# Patient Record
Sex: Male | Born: 1980 | Race: White | Hispanic: No | Marital: Single | Smoking: Current every day smoker
Health system: Southern US, Community
[De-identification: ages and names within clinical notes are randomized; demographics above are authoritative.]

## PROBLEM LIST (undated history)

## (undated) DIAGNOSIS — R1115 Cyclical vomiting syndrome unrelated to migraine: Secondary | ICD-10-CM

## (undated) DIAGNOSIS — K219 Gastro-esophageal reflux disease without esophagitis: Secondary | ICD-10-CM

## (undated) HISTORY — PX: CHOLECYSTECTOMY: SHX55

---

## 2003-11-22 ENCOUNTER — Emergency Department: Payer: Self-pay | Admitting: Emergency Medicine

## 2004-06-20 ENCOUNTER — Inpatient Hospital Stay: Payer: Self-pay | Admitting: Internal Medicine

## 2004-12-20 ENCOUNTER — Ambulatory Visit: Payer: Self-pay | Admitting: Orthopaedic Surgery

## 2008-08-06 ENCOUNTER — Emergency Department: Payer: Self-pay | Admitting: Emergency Medicine

## 2009-02-13 ENCOUNTER — Emergency Department: Payer: Self-pay | Admitting: Internal Medicine

## 2009-05-27 ENCOUNTER — Ambulatory Visit: Payer: Self-pay | Admitting: Otolaryngology

## 2009-08-01 ENCOUNTER — Emergency Department: Payer: Self-pay | Admitting: Emergency Medicine

## 2010-03-08 ENCOUNTER — Inpatient Hospital Stay: Payer: Self-pay | Admitting: Internal Medicine

## 2010-10-09 ENCOUNTER — Emergency Department: Payer: Self-pay | Admitting: Emergency Medicine

## 2010-11-04 ENCOUNTER — Emergency Department: Payer: Self-pay | Admitting: *Deleted

## 2010-12-12 ENCOUNTER — Inpatient Hospital Stay: Payer: Self-pay | Admitting: Internal Medicine

## 2010-12-17 ENCOUNTER — Emergency Department: Payer: Self-pay | Admitting: *Deleted

## 2011-05-07 ENCOUNTER — Emergency Department: Payer: Self-pay | Admitting: Emergency Medicine

## 2011-05-07 LAB — URINALYSIS, COMPLETE
Bacteria: NONE SEEN
Ketone: NEGATIVE
Leukocyte Esterase: NEGATIVE
Nitrite: NEGATIVE
Ph: 6 (ref 4.5–8.0)
Protein: 30
RBC,UR: 5 /HPF (ref 0–5)
Specific Gravity: 1.019 (ref 1.003–1.030)
Squamous Epithelial: NONE SEEN
WBC UR: 2 /HPF (ref 0–5)

## 2011-05-07 LAB — COMPREHENSIVE METABOLIC PANEL
Albumin: 4.9 g/dL (ref 3.4–5.0)
Alkaline Phosphatase: 79 U/L (ref 50–136)
BUN: 12 mg/dL (ref 7–18)
Bilirubin,Total: 1.1 mg/dL — ABNORMAL HIGH (ref 0.2–1.0)
Chloride: 97 mmol/L — ABNORMAL LOW (ref 98–107)
Co2: 29 mmol/L (ref 21–32)
EGFR (African American): >60
EGFR (Non-African Amer.): >60
SGPT (ALT): 56 U/L
Sodium: 135 mmol/L — ABNORMAL LOW (ref 136–145)

## 2011-05-07 LAB — CBC
HCT: 54 % — ABNORMAL HIGH (ref 40.0–52.0)
MCH: 30.6 pg (ref 26.0–34.0)
Platelet: 304 10*3/uL (ref 150–440)
RBC: 5.89 10*6/uL (ref 4.40–5.90)

## 2011-05-07 LAB — LIPASE, BLOOD: Lipase: 409 U/L — ABNORMAL HIGH (ref 73–393)

## 2011-05-16 ENCOUNTER — Emergency Department: Payer: Self-pay | Admitting: Emergency Medicine

## 2011-05-16 LAB — URINALYSIS, COMPLETE
Bilirubin,UR: NEGATIVE
Ketone: NEGATIVE
Nitrite: NEGATIVE
Ph: 5 (ref 4.5–8.0)
Protein: 30
RBC,UR: 13 /HPF (ref 0–5)
Specific Gravity: 1.02 (ref 1.003–1.030)
Squamous Epithelial: NONE SEEN
WBC UR: 623 /HPF (ref 0–5)

## 2011-05-18 LAB — URINE CULTURE

## 2011-11-11 ENCOUNTER — Emergency Department: Payer: Self-pay | Admitting: Internal Medicine

## 2011-11-11 LAB — COMPREHENSIVE METABOLIC PANEL
Albumin: 4.5 g/dL (ref 3.4–5.0)
Alkaline Phosphatase: 69 U/L (ref 50–136)
Anion Gap: 12 (ref 7–16)
Calcium, Total: 8.9 mg/dL (ref 8.5–10.1)
Co2: 27 mmol/L (ref 21–32)
Creatinine: 0.84 mg/dL (ref 0.60–1.30)
EGFR (Non-African Amer.): >60
Glucose: 95 mg/dL (ref 65–99)
Osmolality: 272 (ref 275–301)
SGOT(AST): 42 U/L — ABNORMAL HIGH (ref 15–37)
Sodium: 136 mmol/L (ref 136–145)

## 2011-11-11 LAB — CBC
HCT: 49.6 % (ref 40.0–52.0)
HGB: 17 g/dL (ref 13.0–18.0)
MCH: 31.3 pg (ref 26.0–34.0)
MCHC: 34.3 g/dL (ref 32.0–36.0)
MCV: 91 fL (ref 80–100)

## 2011-11-12 ENCOUNTER — Emergency Department: Payer: Self-pay | Admitting: Emergency Medicine

## 2011-11-12 LAB — URINALYSIS, COMPLETE
Bacteria: NONE SEEN
Glucose,UR: NEGATIVE mg/dL (ref 0–75)
Leukocyte Esterase: NEGATIVE
Nitrite: NEGATIVE
RBC,UR: 3 /HPF (ref 0–5)
Specific Gravity: 1.016 (ref 1.003–1.030)
WBC UR: 1 /HPF (ref 0–5)

## 2011-11-12 LAB — COMPREHENSIVE METABOLIC PANEL
Alkaline Phosphatase: 65 U/L (ref 50–136)
Bilirubin,Total: 0.9 mg/dL (ref 0.2–1.0)
Calcium, Total: 8.9 mg/dL (ref 8.5–10.1)
Chloride: 101 mmol/L (ref 98–107)
Co2: 24 mmol/L (ref 21–32)
Creatinine: 0.64 mg/dL (ref 0.60–1.30)
EGFR (African American): >60
EGFR (Non-African Amer.): >60
SGOT(AST): 29 U/L (ref 15–37)
SGPT (ALT): 65 U/L (ref 12–78)

## 2011-11-12 LAB — CBC
HCT: 47.2 % (ref 40.0–52.0)
HGB: 15.7 g/dL (ref 13.0–18.0)
MCH: 30.7 pg (ref 26.0–34.0)
MCHC: 33.3 g/dL (ref 32.0–36.0)
MCV: 92 fL (ref 80–100)
RBC: 5.12 10*6/uL (ref 4.40–5.90)

## 2013-09-23 ENCOUNTER — Emergency Department: Payer: Self-pay | Admitting: Emergency Medicine

## 2013-09-23 LAB — CBC
HCT: 54.6 % — AB (ref 40.0–52.0)
HGB: 17.9 g/dL (ref 13.0–18.0)
MCH: 31.3 pg (ref 26.0–34.0)
MCHC: 32.8 g/dL (ref 32.0–36.0)
MCV: 95 fL (ref 80–100)
PLATELETS: 202 10*3/uL (ref 150–440)
RBC: 5.73 10*6/uL (ref 4.40–5.90)
RDW: 15.5 % — ABNORMAL HIGH (ref 11.5–14.5)
WBC: 10.4 10*3/uL (ref 3.8–10.6)

## 2013-09-23 LAB — COMPREHENSIVE METABOLIC PANEL
Albumin: 4.1 g/dL (ref 3.4–5.0)
Alkaline Phosphatase: 65 U/L
Anion Gap: 12 (ref 7–16)
BILIRUBIN TOTAL: 0.6 mg/dL (ref 0.2–1.0)
BUN: 8 mg/dL (ref 7–18)
CHLORIDE: 102 mmol/L (ref 98–107)
Calcium, Total: 9.3 mg/dL (ref 8.5–10.1)
Co2: 25 mmol/L (ref 21–32)
Creatinine: 0.98 mg/dL (ref 0.60–1.30)
EGFR (African American): >60
EGFR (Non-African Amer.): >60
Glucose: 165 mg/dL — ABNORMAL HIGH (ref 65–99)
Osmolality: 280 (ref 275–301)
Potassium: 3.6 mmol/L (ref 3.5–5.1)
SGOT(AST): 21 U/L (ref 15–37)
SGPT (ALT): 30 U/L
SODIUM: 139 mmol/L (ref 136–145)
Total Protein: 7.6 g/dL (ref 6.4–8.2)

## 2013-09-23 LAB — URINALYSIS, COMPLETE
Bacteria: NONE SEEN
Bilirubin,UR: NEGATIVE
Blood: NEGATIVE
Glucose,UR: 150 mg/dL (ref 0–75)
Leukocyte Esterase: NEGATIVE
Nitrite: NEGATIVE
Ph: 6 (ref 4.5–8.0)
Protein: 30
SPECIFIC GRAVITY: 1.024 (ref 1.003–1.030)
SQUAMOUS EPITHELIAL: NONE SEEN

## 2013-09-23 LAB — LIPASE, BLOOD: Lipase: 96 U/L (ref 73–393)

## 2013-09-23 LAB — TROPONIN I: Troponin-I: <0.02 ng/mL

## 2013-09-24 ENCOUNTER — Emergency Department: Payer: Self-pay | Admitting: Student

## 2014-06-15 ENCOUNTER — Emergency Department: Payer: Self-pay

## 2014-06-15 ENCOUNTER — Encounter: Payer: Self-pay | Admitting: Emergency Medicine

## 2014-06-15 ENCOUNTER — Inpatient Hospital Stay
Admission: EM | Admit: 2014-06-15 | Discharge: 2014-06-22 | DRG: 392 | Disposition: A | Discharge status: Home / Self Care | Payer: Self-pay | Attending: Internal Medicine | Admitting: Internal Medicine

## 2014-06-15 DIAGNOSIS — F439 Reaction to severe stress, unspecified: Secondary | ICD-10-CM | POA: Diagnosis present

## 2014-06-15 DIAGNOSIS — F1290 Cannabis use, unspecified, uncomplicated: Secondary | ICD-10-CM | POA: Diagnosis present

## 2014-06-15 DIAGNOSIS — K219 Gastro-esophageal reflux disease without esophagitis: Secondary | ICD-10-CM | POA: Diagnosis present

## 2014-06-15 DIAGNOSIS — Z880 Allergy status to penicillin: Secondary | ICD-10-CM

## 2014-06-15 DIAGNOSIS — K760 Fatty (change of) liver, not elsewhere classified: Secondary | ICD-10-CM | POA: Diagnosis present

## 2014-06-15 DIAGNOSIS — Z79899 Other long term (current) drug therapy: Secondary | ICD-10-CM

## 2014-06-15 DIAGNOSIS — E860 Dehydration: Secondary | ICD-10-CM | POA: Diagnosis present

## 2014-06-15 DIAGNOSIS — F17210 Nicotine dependence, cigarettes, uncomplicated: Secondary | ICD-10-CM | POA: Diagnosis present

## 2014-06-15 DIAGNOSIS — D72829 Elevated white blood cell count, unspecified: Secondary | ICD-10-CM | POA: Diagnosis present

## 2014-06-15 DIAGNOSIS — K2980 Duodenitis without bleeding: Secondary | ICD-10-CM | POA: Diagnosis present

## 2014-06-15 DIAGNOSIS — K2970 Gastritis, unspecified, without bleeding: Secondary | ICD-10-CM | POA: Diagnosis present

## 2014-06-15 DIAGNOSIS — R197 Diarrhea, unspecified: Secondary | ICD-10-CM | POA: Diagnosis present

## 2014-06-15 DIAGNOSIS — R1110 Vomiting, unspecified: Secondary | ICD-10-CM | POA: Diagnosis present

## 2014-06-15 DIAGNOSIS — K2900 Acute gastritis without bleeding: Principal | ICD-10-CM | POA: Diagnosis present

## 2014-06-15 HISTORY — DX: Gastro-esophageal reflux disease without esophagitis: K21.9

## 2014-06-15 LAB — CBC WITH DIFFERENTIAL/PLATELET
Basophils Absolute: 0.1 10*3/uL (ref 0–0.1)
Basophils Relative: 1 %
EOS ABS: 0 10*3/uL (ref 0–0.7)
EOS PCT: 0 %
HEMATOCRIT: 50.7 % (ref 40.0–52.0)
HEMOGLOBIN: 16.9 g/dL (ref 13.0–18.0)
LYMPHS ABS: 2 10*3/uL (ref 1.0–3.6)
Lymphocytes Relative: 12 %
MCH: 31.9 pg (ref 26.0–34.0)
MCHC: 33.4 g/dL (ref 32.0–36.0)
MCV: 95.5 fL (ref 80.0–100.0)
MONOS PCT: 4 %
Monocytes Absolute: 0.7 10*3/uL (ref 0.2–1.0)
Neutro Abs: 14.1 10*3/uL — ABNORMAL HIGH (ref 1.4–6.5)
Neutrophils Relative %: 83 %
Platelets: 260 10*3/uL (ref 150–440)
RBC: 5.31 MIL/uL (ref 4.40–5.90)
RDW: 13.6 % (ref 11.5–14.5)
WBC: 17 10*3/uL — ABNORMAL HIGH (ref 3.8–10.6)

## 2014-06-15 LAB — BASIC METABOLIC PANEL
ANION GAP: 16 — AB (ref 5–15)
BUN: 13 mg/dL (ref 6–20)
CO2: 23 mmol/L (ref 22–32)
Calcium: 9.5 mg/dL (ref 8.9–10.3)
Chloride: 100 mmol/L — ABNORMAL LOW (ref 101–111)
Creatinine, Ser: 0.95 mg/dL (ref 0.61–1.24)
GFR calc Af Amer: >60 mL/min (ref >60)
GLUCOSE: 167 mg/dL — AB (ref 65–99)
Potassium: 4.1 mmol/L (ref 3.5–5.1)
SODIUM: 139 mmol/L (ref 135–145)

## 2014-06-15 LAB — URINE DRUG SCREEN, QUALITATIVE (ARMC ONLY)
Amphetamines, Ur Screen: NOT DETECTED
BENZODIAZEPINE, UR SCRN: NOT DETECTED
Barbiturates, Ur Screen: NOT DETECTED
Cannabinoid 50 Ng, Ur ~~LOC~~: POSITIVE — AB
Cocaine Metabolite,Ur ~~LOC~~: NOT DETECTED
MDMA (ECSTASY) UR SCREEN: NOT DETECTED
Methadone Scn, Ur: NOT DETECTED
Opiate, Ur Screen: NOT DETECTED
Phencyclidine (PCP) Ur S: NOT DETECTED
Tricyclic, Ur Screen: NOT DETECTED

## 2014-06-15 LAB — LIPASE, BLOOD: LIPASE: 31 U/L (ref 22–51)

## 2014-06-15 LAB — TROPONIN I: Troponin I: <0.03 ng/mL (ref <0.031)

## 2014-06-15 MED ORDER — ONDANSETRON HCL 4 MG PO TABS
4.0000 mg | ORAL_TABLET | Freq: Four times a day (QID) | ORAL | Status: DC | PRN
  Administered 2014-06-17: 4 mg via ORAL
  Filled 2014-06-15: qty 1

## 2014-06-15 MED ORDER — PANTOPRAZOLE SODIUM 40 MG IV SOLR
INTRAVENOUS | Status: AC
  Filled 2014-06-15: qty 40

## 2014-06-15 MED ORDER — PANTOPRAZOLE SODIUM 40 MG IV SOLR
INTRAVENOUS | Status: AC
  Administered 2014-06-15: 40 mg
  Filled 2014-06-15: qty 40

## 2014-06-15 MED ORDER — ONDANSETRON HCL 4 MG/2ML IJ SOLN
4.0000 mg | Freq: Once | INTRAMUSCULAR | Status: AC
  Administered 2014-06-15: 4 mg via INTRAVENOUS

## 2014-06-15 MED ORDER — ACETAMINOPHEN 325 MG PO TABS
650.0000 mg | ORAL_TABLET | Freq: Four times a day (QID) | ORAL | Status: DC | PRN
Start: 2014-06-15 — End: 2014-06-22
  Administered 2014-06-16 – 2014-06-22 (×2): 650 mg via ORAL
  Filled 2014-06-15 (×2): qty 2

## 2014-06-15 MED ORDER — ONDANSETRON HCL 4 MG/2ML IJ SOLN
4.0000 mg | Freq: Four times a day (QID) | INTRAMUSCULAR | Status: DC | PRN
  Administered 2014-06-15 – 2014-06-17 (×7): 4 mg via INTRAVENOUS
  Filled 2014-06-15 (×7): qty 2

## 2014-06-15 MED ORDER — SODIUM CHLORIDE 0.9 % IV SOLN
INTRAVENOUS | Status: AC
  Administered 2014-06-15: 1 mL via INTRAVENOUS
  Administered 2014-06-15: via INTRAVENOUS

## 2014-06-15 MED ORDER — PROMETHAZINE HCL 25 MG/ML IJ SOLN
12.5000 mg | Freq: Once | INTRAMUSCULAR | Status: AC
  Administered 2014-06-15: 12.5 mg via INTRAMUSCULAR
  Filled 2014-06-15: qty 1

## 2014-06-15 MED ORDER — PROMETHAZINE HCL 25 MG/ML IJ SOLN
INTRAMUSCULAR | Status: AC
  Filled 2014-06-15: qty 1

## 2014-06-15 MED ORDER — SODIUM CHLORIDE 0.9 % IV SOLN
1000.0000 mL | Freq: Once | INTRAVENOUS | Status: AC
  Administered 2014-06-15: 1000 mL via INTRAVENOUS

## 2014-06-15 MED ORDER — PROMETHAZINE HCL 25 MG/ML IJ SOLN
25.0000 mg | Freq: Once | INTRAMUSCULAR | Status: AC
  Administered 2014-06-15: 25 mg via INTRAVENOUS

## 2014-06-15 MED ORDER — IOHEXOL 300 MG/ML  SOLN
100.0000 mL | Freq: Once | INTRAMUSCULAR | Status: AC | PRN
  Administered 2014-06-15: 100 mL via INTRAVENOUS

## 2014-06-15 MED ORDER — ACETAMINOPHEN 650 MG RE SUPP
650.0000 mg | Freq: Four times a day (QID) | RECTAL | Status: DC | PRN
Start: 2014-06-15 — End: 2014-06-22

## 2014-06-15 MED ORDER — SODIUM CHLORIDE 0.9 % IV SOLN
1000.0000 mL | Freq: Once | INTRAVENOUS | Status: AC
Start: 2014-06-15 — End: 2014-06-15
  Administered 2014-06-15: 1000 mL via INTRAVENOUS

## 2014-06-15 MED ORDER — ONDANSETRON HCL 4 MG/2ML IJ SOLN
INTRAMUSCULAR | Status: AC
  Administered 2014-06-15: 4 mg via INTRAVENOUS
  Filled 2014-06-15: qty 2

## 2014-06-15 MED ORDER — PROMETHAZINE HCL 12.5 MG PO TABS: 12.5000 mg | ORAL_TABLET | Freq: Four times a day (QID) | ORAL | Status: DC | PRN

## 2014-06-15 MED ORDER — SODIUM CHLORIDE 0.9 % IV BOLUS (SEPSIS)
1000.0000 mL | Freq: Once | INTRAVENOUS | Status: AC
  Administered 2014-06-15: 1000 mL via INTRAVENOUS

## 2014-06-15 MED ORDER — PANTOPRAZOLE SODIUM 40 MG IV SOLR
40.0000 mg | Freq: Two times a day (BID) | INTRAVENOUS | Status: DC
  Administered 2014-06-15 – 2014-06-22 (×13): 40 mg via INTRAVENOUS
  Filled 2014-06-15 (×13): qty 40

## 2014-06-15 MED ORDER — ONDANSETRON HCL 4 MG/2ML IJ SOLN
INTRAMUSCULAR | Status: AC
  Filled 2014-06-15: qty 2

## 2014-06-15 NOTE — H&P (Signed)
[facility] [place] NAME: Dakota Bowen    MR#:  [id number]  DATE OF BIRTH:  16-Oct-1980  DATE OF ADMISSION:  06/15/2014  PRIMARY CARE PHYSICIAN: No PCP Per Patient   REQUESTING/REFERRING PHYSICIAN: Dr. Corky Downs  CHIEF COMPLAINT:   Chief Complaint  Patient presents with  . Emesis    HISTORY OF PRESENT ILLNESS:  Dakota Bowen  is a 34 y.o. male with a known history of GERD presents to the emergency room with intractable nausea and vomiting. Symptoms started yesterday afternoon. Patient has had multiple episodes of vomiting including a few here in the emergency room. No blood or abdominal pain. No constipation or diarrhea. Patient had similar symptoms in the past with last episode about 6 months back which resolved in 2-3 days. He mentions that he is supposed to be on Prilosec for GERD which he hasn't been taking. No sick contacts. No recent change in medications.  He did use marijuana about 5 days back. He normally drinks about 2 beers every other day. No recent binging. No history of abdominal surgeries.  CT scan of the abdomen done in the emergency room showed nothing acute.  PAST MEDICAL HISTORY:   Past Medical History  Diagnosis Date  . GERD (gastroesophageal reflux disease)     PAST SURGICAL HISTORY:  History reviewed. No pertinent past surgical history.  SOCIAL HISTORY:   History  Substance Use Topics  . Smoking status: Current Every Day Smoker -- 1.50 packs/day  . Smokeless tobacco: Not on file  . Alcohol Use: Yes     Comment: 6-7 beers a week    FAMILY HISTORY:   Family History  Problem Relation Age of Onset  . Diabetes Mother   . Diabetes Father     DRUG ALLERGIES:   Allergies  Allergen Reactions  . Penicillins Rash    REVIEW OF SYSTEMS:   Review of Systems  Constitutional: Positive for malaise/fatigue. Negative for fever, chills and weight loss.  HENT: Negative for hearing loss and nosebleeds.    Eyes: Negative for blurred vision, double vision and pain.  Respiratory: Negative for cough, hemoptysis, sputum production, shortness of breath and wheezing.   Cardiovascular: Negative for chest pain, palpitations, orthopnea and leg swelling.  Gastrointestinal: Positive for heartburn, nausea and vomiting. Negative for abdominal pain, diarrhea and constipation.  Genitourinary: Negative for dysuria and hematuria.  Musculoskeletal: Negative for myalgias, back pain and falls.  Skin: Negative for rash.  Neurological: Positive for weakness. Negative for dizziness, tremors, sensory change, speech change, focal weakness, seizures and headaches.  Endo/Heme/Allergies: Does not bruise/bleed easily.  Psychiatric/Behavioral: Negative for depression and memory loss. The patient is not nervous/anxious.     MEDICATIONS AT HOME:   Prior to Admission medications   Medication Sig Start Date End Date Taking? Authorizing Provider  omeprazole (PRILOSEC OTC) 20 MG tablet Take 20 mg by mouth daily.   Yes Historical Provider, MD  promethazine (PHENERGAN) 12.5 MG tablet Take 1 tablet (12.5 mg total) by mouth every 6 (six) hours as needed for nausea or vomiting. 06/15/14   Lavonia Drafts, MD      VITAL SIGNS:  Blood pressure 122/84, pulse 75, temperature 99 F (37.2 C), temperature source Oral, resp. rate 16, height 6' (1.829 m), weight 61.236 kg (135 lb), SpO2 100 %.  PHYSICAL EXAMINATION:  Physical Exam  GENERAL:  34 y.o.-year-old patient lying in the bed with no acute distress.  EYES: Pupils equal, round, reactive to light and accommodation.  No scleral icterus. Extraocular muscles intact.  HEENT: Head atraumatic, normocephalic. Oropharynx and nasopharynx clear. No oropharyngeal erythema, DRY oral mucosa  NECK:  Supple, no jugular venous distention. No thyroid enlargement, no tenderness.  LUNGS: Normal breath sounds bilaterally, no wheezing, rales, rhonchi. No use of accessory muscles of respiration.   CARDIOVASCULAR: S1, S2 normal. No murmurs, rubs, or gallops.  ABDOMEN: Soft, nontender, nondistended. Bowel sounds present. No organomegaly or mass.  EXTREMITIES: No pedal edema, cyanosis, or clubbing. + 2 pedal & radial pulses b/l.   NEUROLOGIC: Cranial nerves II through XII are intact. No focal Motor or sensory deficits appreciated b/l PSYCHIATRIC: The patient is alert and oriented x 3. Good affect.  SKIN: No obvious rash, lesion, or ulcer.DRY   LABORATORY PANEL:   CBC  Recent Labs Lab 06/15/14 0616  WBC 17.0*  HGB 16.9  HCT 50.7  PLT 260   ------------------------------------------------------------------------------------------------------------------  Chemistries   Recent Labs Lab 06/15/14 0616  NA 139  K 4.1  CL 100*  CO2 23  GLUCOSE 167*  BUN 13  CREATININE 0.95  CALCIUM 9.5   ------------------------------------------------------------------------------------------------------------------  Cardiac Enzymes  Recent Labs Lab 06/15/14 0815  TROPONINI <0.03   ------------------------------------------------------------------------------------------------------------------  RADIOLOGY:  Ct Abdomen Pelvis W Contrast  06/15/2014   CLINICAL DATA:  Abdominal pain with nausea vomiting  EXAM: CT ABDOMEN AND PELVIS WITH CONTRAST  TECHNIQUE: Multidetector CT imaging of the abdomen and pelvis was performed using the standard protocol following bolus administration of intravenous contrast. Oral contrast was not administered.  CONTRAST:  150mL OMNIPAQUE IOHEXOL 300 MG/ML  SOLN  COMPARISON:  September 24, 2013  FINDINGS: Lung bases are clear.  There is a small hiatal hernia.  Liver is prominent measuring 19.2 cm in length. There is hepatic steatosis. No focal liver lesions are identified. Gallbladder is absent. There is no biliary duct dilatation.  Spleen, pancreas, and adrenals appear normal. Kidneys bilaterally show no mass or hydronephrosis on either side. There is no renal  or ureteral calculus on either side.  In the pelvis, the urinary bladder is midline. There are a few prostatic calculi. There is no pelvic mass or pelvic fluid collection. The rectum is mildly distended with stool and air.  The appendix appears normal.  There is no bowel obstruction. No free air or portal venous air. There is no demonstrable ascites, adenopathy, or abscess in the abdomen or pelvis. There is no abdominal aortic aneurysm. There are no blastic or lytic bone lesions. There remains broad-based disc bulging and L4-5 and L5-S1.  IMPRESSION: No bowel obstruction or abscess. Appendix appears normal. No renal or ureteral calculi. No hydronephrosis.  Gallbladder absent.  Liver prominent with hepatic steatosis.  Central disc protrusion at L4-5 and L5-S1.   Electronically Signed   By: Lowella Grip III M.D.   On: 06/15/2014 13:16     IMPRESSION AND PLAN:   * Intractable nausea and vomiting. Etiology is unclear. CT scan of the abdomen and pelvis is normal. Also his abdominal examination is benign. Likely gastritis. We will check a urine drug screen. At this point patient is unable to keep any fluids or food down. Unable to tolerate any oral antiemesis meds. Also is severely dehydrated. We will admit patient under observation. Give him a bolus of normal saline 1 L and put him on continuous IV hydration.  * Leucocytosis - likely from hemoconcentration and stress reaction. No fever and no signs of infection.  * GERD- Protonix IV BID    All the records are  reviewed and case discussed with ED provider. Management plans discussed with the patient, family and they are in agreement.  CODE STATUS: FULL CODE  TOTAL TIME TAKING CARE OF THIS PATIENT: 40 minutes.    Hillary Bow R M.D on 06/15/2014 at 2:05 PM  Between 7am to 6pm - Pager - [phone]  After 6pm go to [email or web address] - password [email or web address] [facility]  [facility]  Office  [phone]  CC: Primary care physician; No PCP  Per Patient

## 2014-06-15 NOTE — ED Provider Notes (Signed)
[facility] Emergency Department Provider Note  ____________________________________________  Time seen: 7 AM  I have reviewed the triage vital signs and the nursing notes.   HISTORY  Chief Complaint Emesis      HPI Dakota Bowen is a 34 y.o. male who reports nausea vomiting and epigastric discomfort since noon yesterday. His vomiting continued overnight and hence he called EMS. He denies diarrhea. He denies fever. He states this happens every 6 months or so and he requires fluid and anti-emetics. He reports a history of cholecystectomy.  He describes the epigastric discomfort is burning currently very mild.     No past medical history on file.  There are no active problems to display for this patient.   No past surgical history on file.  Current Outpatient Rx  Name  Route  Sig  Dispense  Refill  . omeprazole (PRILOSEC OTC) 20 MG tablet   Oral   Take 20 mg by mouth daily.         . promethazine (PHENERGAN) 12.5 MG tablet   Oral   Take 1 tablet (12.5 mg total) by mouth every 6 (six) hours as needed for nausea or vomiting.   20 tablet   0     Allergies Penicillins  No family history on file.  Social History History  Substance Use Topics  . Smoking status: Current Every Day Smoker  . Smokeless tobacco: Not on file  . Alcohol Use: Yes    Review of Systems  Constitutional: Negative for fever. Eyes: Negative for visual changes. ENT: Negative for sore throat Cardiovascular: Negative for chest pain. Respiratory: Negative for shortness of breath. Gastrointestinal: Positive for abdominal cramping and vomiting Genitourinary: Negative for dysuria. Musculoskeletal: Negative for back pain. Skin: Negative for rash. Neurological: Negative for headaches or focal weakness Psychiatric: Positive for anxiety  10-point ROS otherwise negative.  ____________________________________________   PHYSICAL EXAM:  VITAL SIGNS: ED Triage  Vitals  Enc Vitals Group     BP 06/15/14 0556 143/96 mmHg     Pulse Rate 06/15/14 0556 54     Resp 06/15/14 0556 14     Temp 06/15/14 0558 97.1 F (36.2 C)     Temp Source 06/15/14 0558 Oral     SpO2 06/15/14 0556 99 %     Weight 06/15/14 0556 135 lb (61.236 kg)     Height 06/15/14 0556 6' (1.829 m)     Head Cir --      Peak Flow --      Pain Score 06/15/14 0602 9     Pain Loc --      Pain Edu? --      Excl. in [place]? --      Constitutional: Alert and oriented. Well appearing and in no distress. Eyes: Conjunctivae are normal. PERRL. ENT   Head: Normocephalic and atraumatic.   Nose: No rhinnorhea.   Mouth/Throat: Mucous membranes are moist. Cardiovascular: Normal rate, regular rhythm. Normal and symmetric distal pulses are present in all extremities. No murmurs, rubs, or gallops. Respiratory: Normal respiratory effort without tachypnea nor retractions. Breath sounds are clear and equal bilaterally.  Gastrointestinal: No tenderness to palpation. No distention. There is no CVA tenderness. Genitourinary: deferred Musculoskeletal: Nontender with normal range of motion in all extremities. No lower extremity tenderness nor edema. Neurologic:  Normal speech and language. No gross focal neurologic deficits are appreciated. Skin:  Skin is warm, dry and intact. No rash noted. Psychiatric: Mood and affect are normal. Patient exhibits appropriate insight  and judgment.  ____________________________________________    LABS (pertinent positives/negatives)  Labs Reviewed  CBC WITH DIFFERENTIAL/PLATELET - Abnormal; Notable for the following:    WBC 17.0 (*)    Neutro Abs 14.1 (*)    All other components within normal limits  BASIC METABOLIC PANEL - Abnormal; Notable for the following:    Chloride 100 (*)    Glucose, Bld 167 (*)    Anion gap 16 (*)    All other components within normal limits  LIPASE, BLOOD  TROPONIN I   Anion gap noted but normal  CO2 ____________________________________________   EKG  ED ECG REPORT I, Lavonia Drafts, the attending physician, personally viewed and interpreted this ECG.   Date: 06/15/2014  EKG Time: 5:58 AM  Rate: 47  Rhythm: nonspecific ST and T waves changes, sinus bradycardia  Axis: Normal  Intervals:none  ST&T Change: Nonspecific   ____________________________________________    RADIOLOGY  CT abdomen pelvis unremarkable  ____________________________________________   PROCEDURES  Procedure(s) performed: none  Critical Care performed: none  ____________________________________________   INITIAL IMPRESSION / ASSESSMENT AND PLAN / ED COURSE  Pertinent labs & imaging results that were available during my care of the patient were reviewed by me and considered in my medical decision making (see chart for details).  Patient overall well-appearing. He does appear to have significant sinus bradycardia however he notes this is usual for him, we will give IV fluids and antiemetics and monitor the patient.  ____________________________________________ ----------------------------------------- 12:02 PM on 06/15/2014 -----------------------------------------  Patient feeling significantly better after 2 L of IV fluids. Heart rate normal. He is anxious to leave. Discussed with patient strict return precautions given borderline anion gap which I suspect would be resolved after fluids  ----------------------------------------- 12:24 PM on 06/15/2014 -----------------------------------------  Patient now vomiting again. Will discuss with hospitalist for admission for intractable nausea vomiting    FINAL CLINICAL IMPRESSION(S) / ED DIAGNOSES  Final diagnoses:  Gastritis  Intractable vomiting with nausea, vomiting of unspecified type     Lavonia Drafts, MD 06/15/14 1345

## 2014-06-15 NOTE — ED Notes (Signed)
Pt presents to ED via EMS from personal home with c/o of vomiting episodes occurring since yesterday, lunch time. EMS states pt has a hx of acid reflux. EMS states pt has had similar sx every 6 months. EMS states pt has had x1 episode upon route to ER. VS per EMS listed below:   151/91 BP 100% O2 RA  50 HR

## 2014-06-15 NOTE — ED Notes (Signed)
Patient transported to CT 

## 2014-06-15 NOTE — ED Notes (Signed)
EDP informed of pt's continued vomiting

## 2014-06-15 NOTE — Progress Notes (Signed)
Paged Dr. Leslye Peer for pt c/o nausea and vomiting. Not controlled with current meds. Orders received for one time dose of 12.5 phenergan IM

## 2014-06-16 LAB — CBC
HEMATOCRIT: 44.7 % (ref 40.0–52.0)
Hemoglobin: 14.8 g/dL (ref 13.0–18.0)
MCH: 32 pg (ref 26.0–34.0)
MCHC: 33.2 g/dL (ref 32.0–36.0)
MCV: 96.4 fL (ref 80.0–100.0)
Platelets: 195 10*3/uL (ref 150–440)
RBC: 4.64 MIL/uL (ref 4.40–5.90)
RDW: 13.9 % (ref 11.5–14.5)
WBC: 10.7 10*3/uL — AB (ref 3.8–10.6)

## 2014-06-16 LAB — LIPASE, BLOOD: LIPASE: 47 U/L (ref 22–51)

## 2014-06-16 NOTE — Progress Notes (Signed)
[facility] [place] NAME: Dakota Bowen    MR#:  [id number]  DATE OF BIRTH:  1981/01/05  SUBJECTIVE: 34 year old male patient with no past medical history admitted for intractable nausea, vomiting, abdominal pain. Still having nausea and vomiting unable to keep anything down.   CHIEF COMPLAINT:   Chief Complaint  Patient presents with  . Emesis    REVIEW OF SYSTEMS:    Review of Systems  Constitutional: Negative for fever and chills.  HENT: Negative for hearing loss.   Eyes: Negative for blurred vision, double vision and photophobia.  Respiratory: Negative for cough, hemoptysis and shortness of breath.   Cardiovascular: Negative for palpitations, orthopnea and leg swelling.  Gastrointestinal: Positive for nausea, vomiting and abdominal pain. Negative for diarrhea.  Genitourinary: Negative for dysuria and urgency.  Musculoskeletal: Negative for myalgias and neck pain.  Skin: Negative for rash.  Neurological: Negative for dizziness, focal weakness, seizures, weakness and headaches.  Psychiatric/Behavioral: Negative for memory loss. The patient does not have insomnia.     Nutrition: Liquid diet Tolerating Diet: No Tolerating PT:      DRUG ALLERGIES:   Allergies  Allergen Reactions  . Penicillins Rash    VITALS:  Blood pressure 129/89, pulse 69, temperature 98.8 F (37.1 C), temperature source Oral, resp. rate 18, height 6' (1.829 m), weight 66.3 kg (146 lb 2.6 oz), SpO2 100 %.  PHYSICAL EXAMINATION:   Physical Exam  GENERAL:  34 y.o.-year-old patient lying in the bed with no acute distress.  EYES: Pupils equal, round, reactive to light and accommodation. No scleral icterus. Extraocular muscles intact.  HEENT: Head atraumatic, normocephalic. Oropharynx and nasopharynx clear.  NECK:  Supple, no jugular venous distention. No thyroid enlargement, no tenderness.  LUNGS: Normal breath sounds bilaterally, no wheezing,  rales,rhonchi or crepitation. No use of accessory muscles of respiration.  CARDIOVASCULAR: S1, S2 normal. No murmurs, rubs, or gallops.  ABDOMEN: Mild midepigastric tenderness present bowel sounds present no organomegaly EXTREMITIES: No pedal edema, cyanosis, or clubbing.  NEUROLOGIC: Cranial nerves II through XII are intact. Muscle strength 5/5 in all extremities. Sensation intact. Gait not checked.  PSYCHIATRIC: The patient is alert and oriented x 3.  SKIN: No obvious rash, lesion, or ulcer.    LABORATORY PANEL:   CBC  Recent Labs Lab 06/16/14 0721  WBC 10.7*  HGB 14.8  HCT 44.7  PLT 195   ------------------------------------------------------------------------------------------------------------------  Chemistries   Recent Labs Lab 06/15/14 0616  NA 139  K 4.1  CL 100*  CO2 23  GLUCOSE 167*  BUN 13  CREATININE 0.95  CALCIUM 9.5   ------------------------------------------------------------------------------------------------------------------  Cardiac Enzymes  Recent Labs Lab 06/15/14 0815  TROPONINI <0.03   ------------------------------------------------------------------------------------------------------------------  RADIOLOGY:  Ct Abdomen Pelvis W Contrast  06/15/2014   CLINICAL DATA:  Abdominal pain with nausea vomiting  EXAM: CT ABDOMEN AND PELVIS WITH CONTRAST  TECHNIQUE: Multidetector CT imaging of the abdomen and pelvis was performed using the standard protocol following bolus administration of intravenous contrast. Oral contrast was not administered.  CONTRAST:  126mL OMNIPAQUE IOHEXOL 300 MG/ML  SOLN  COMPARISON:  September 24, 2013  FINDINGS: Lung bases are clear.  There is a small hiatal hernia.  Liver is prominent measuring 19.2 cm in length. There is hepatic steatosis. No focal liver lesions are identified. Gallbladder is absent. There is no biliary duct dilatation.  Spleen, pancreas, and adrenals appear normal. Kidneys bilaterally show no mass or  hydronephrosis on either side. There is  no renal or ureteral calculus on either side.  In the pelvis, the urinary bladder is midline. There are a few prostatic calculi. There is no pelvic mass or pelvic fluid collection. The rectum is mildly distended with stool and air.  The appendix appears normal.  There is no bowel obstruction. No free air or portal venous air. There is no demonstrable ascites, adenopathy, or abscess in the abdomen or pelvis. There is no abdominal aortic aneurysm. There are no blastic or lytic bone lesions. There remains broad-based disc bulging and L4-5 and L5-S1.  IMPRESSION: No bowel obstruction or abscess. Appendix appears normal. No renal or ureteral calculi. No hydronephrosis.  Gallbladder absent.  Liver prominent with hepatic steatosis.  Central disc protrusion at L4-5 and L5-S1.   Electronically Signed   By: Lowella Grip III M.D.   On: 06/15/2014 13:16     ASSESSMENT AND PLAN:   1. An tractable nausea vomiting abdominal pain likely secondary to acute gastritis. Abdominal CAT scan is unremarkable. Continue IV fluids, IV Zofran, IV PPIs today and monitor closely and possible discharge tomorrow if he tolerates the diet.     All the records are reviewed and case discussed with Care Management/Social Workerr. Management plans discussed with the patient, family and they are in agreement.  CODE STATUS: full code  TOTAL TIME TAKING CARE OF THIS PATIENT: 35  minutes.   POSSIBLE D/C IN 1DAYS, DEPENDING ON CLINICAL CONDITION.   Epifanio Lesches M.D on 06/16/2014 at 9:58 AM  Between 7am to 6pm - Pager - [phone]  After 6pm go to [email or web address] - password [email or web address] [email or web address]  [place] [facility]  Office  [phone]  CC: Primary care physician; No PCP Per Patient

## 2014-06-17 DIAGNOSIS — K2970 Gastritis, unspecified, without bleeding: Secondary | ICD-10-CM | POA: Diagnosis present

## 2014-06-17 MED ORDER — PROMETHAZINE HCL 25 MG/ML IJ SOLN
12.5000 mg | INTRAMUSCULAR | Status: DC | PRN
  Administered 2014-06-17 – 2014-06-22 (×10): 12.5 mg via INTRAVENOUS
  Filled 2014-06-17 (×9): qty 1

## 2014-06-17 MED ORDER — PROMETHAZINE HCL 25 MG/ML IJ SOLN
INTRAMUSCULAR | Status: AC
  Filled 2014-06-17: qty 1

## 2014-06-17 MED ORDER — ONDANSETRON HCL 4 MG PO TABS: 4.0000 mg | ORAL_TABLET | Freq: Four times a day (QID) | ORAL | Status: DC | PRN

## 2014-06-17 MED ORDER — SODIUM CHLORIDE 0.9 % IV SOLN
INTRAVENOUS | Status: DC
  Administered 2014-06-17 – 2014-06-22 (×11): via INTRAVENOUS

## 2014-06-17 MED ORDER — ONDANSETRON HCL 4 MG/2ML IJ SOLN
4.0000 mg | INTRAMUSCULAR | Status: DC | PRN
Start: 2014-06-17 — End: 2014-06-18
  Administered 2014-06-17 – 2014-06-18 (×3): 4 mg via INTRAVENOUS
  Filled 2014-06-17 (×3): qty 2

## 2014-06-17 NOTE — Progress Notes (Signed)
[place] [facility] [place] NAME: Dakota Bowen    MR#:  [id number]  DATE OF BIRTH:  11-05-1980  SUBJECTIVE: 34 year old male patient with no past medical history admitted for intractable nausea, vomiting, abdominal pain. Still having nausea and vomiting unable to keep anything down. Still nauseous today,not able to eat anything.  CHIEF COMPLAINT:   Chief Complaint  Patient presents with  . Emesis    REVIEW OF SYSTEMS:    Review of Systems  Gastrointestinal: Positive for nausea and vomiting.    Nutrition: Liquid diet Tolerating Diet: No Tolerating PT:      DRUG ALLERGIES:   Allergies  Allergen Reactions  . Penicillins Rash    VITALS:  Blood pressure 145/90, pulse 60, temperature 98.4 F (36.9 C), temperature source Oral, resp. rate 19, height 6' (1.829 m), weight 64.501 kg (142 lb 3.2 oz), SpO2 100 %.  PHYSICAL EXAMINATION:   Physical Exam  GENERAL:  34 y.o.-year-old patient lying in the bed with no acute distress.  EYES: Pupils equal, round, reactive to light and accommodation. No scleral icterus. Extraocular muscles intact.  HEENT: Head atraumatic, normocephalic. Oropharynx and nasopharynx clear.  NECK:  Supple, no jugular venous distention. No thyroid enlargement, no tenderness.  LUNGS: Normal breath sounds bilaterally, no wheezing, rales,rhonchi or crepitation. No use of accessory muscles of respiration.  CARDIOVASCULAR: S1, S2 normal. No murmurs, rubs, or gallops.  ABDOMEN: Mild midepigastric tenderness present bowel sounds present no organomegaly EXTREMITIES: No pedal edema, cyanosis, or clubbing.  NEUROLOGIC: Cranial nerves II through XII are intact. Muscle strength 5/5 in all extremities. Sensation intact. Gait not checked.  PSYCHIATRIC: The patient is alert and oriented x 3.  SKIN: No obvious rash, lesion, or ulcer.    LABORATORY PANEL:   CBC  Recent Labs Lab 06/16/14 0721  WBC 10.7*  HGB 14.8  HCT  44.7  PLT 195   ------------------------------------------------------------------------------------------------------------------  Chemistries   Recent Labs Lab 06/15/14 0616  NA 139  K 4.1  CL 100*  CO2 23  GLUCOSE 167*  BUN 13  CREATININE 0.95  CALCIUM 9.5   ------------------------------------------------------------------------------------------------------------------  Cardiac Enzymes  Recent Labs Lab 06/15/14 0815  TROPONINI <0.03   ------------------------------------------------------------------------------------------------------------------  RADIOLOGY:  Ct Abdomen Pelvis W Contrast  06/15/2014   CLINICAL DATA:  Abdominal pain with nausea vomiting  EXAM: CT ABDOMEN AND PELVIS WITH CONTRAST  TECHNIQUE: Multidetector CT imaging of the abdomen and pelvis was performed using the standard protocol following bolus administration of intravenous contrast. Oral contrast was not administered.  CONTRAST:  126mL OMNIPAQUE IOHEXOL 300 MG/ML  SOLN  COMPARISON:  September 24, 2013  FINDINGS: Lung bases are clear.  There is a small hiatal hernia.  Liver is prominent measuring 19.2 cm in length. There is hepatic steatosis. No focal liver lesions are identified. Gallbladder is absent. There is no biliary duct dilatation.  Spleen, pancreas, and adrenals appear normal. Kidneys bilaterally show no mass or hydronephrosis on either side. There is no renal or ureteral calculus on either side.  In the pelvis, the urinary bladder is midline. There are a few prostatic calculi. There is no pelvic mass or pelvic fluid collection. The rectum is mildly distended with stool and air.  The appendix appears normal.  There is no bowel obstruction. No free air or portal venous air. There is no demonstrable ascites, adenopathy, or abscess in the abdomen or pelvis. There is no abdominal aortic aneurysm. There are no blastic or lytic bone lesions. There remains  broad-based disc bulging and L4-5 and L5-S1.   IMPRESSION: No bowel obstruction or abscess. Appendix appears normal. No renal or ureteral calculi. No hydronephrosis.  Gallbladder absent.  Liver prominent with hepatic steatosis.  Central disc protrusion at L4-5 and L5-S1.   Electronically Signed   By: Lowella Grip III M.D.   On: 06/15/2014 13:16     ASSESSMENT AND PLAN:   1. An tractable nausea vomiting abdominal pain likely secondary to acute gastritis. Abdominal CAT scan is unremarkable. Continue IV fluids, IV Zofran, IV PPIs   Still nauseous,poor po intake,continue IV zofran,iv  Fluids today,likely d/c am  All the records are reviewed and case discussed with Care Management/Social Workerr. Management plans discussed with the patient, family and they are in agreement.  CODE STATUS: full code  TOTAL TIME TAKING CARE OF THIS PATIENT: 35  minutes.   POSSIBLE D/C IN 1DAYS, DEPENDING ON CLINICAL CONDITION.   Epifanio Lesches M.D on 06/17/2014 at 9:03 AM  Between 7am to 6pm - Pager - [phone]  After 6pm go to [email or web address] - password [email or web address] [facility]  [facility]  Office  [phone]  CC: Primary care physician; No PCP Per Patient

## 2014-06-18 LAB — GLUCOSE, CAPILLARY
Glucose-Capillary: 171 mg/dL — ABNORMAL HIGH (ref 65–99)
Glucose-Capillary: 160 mg/dL — ABNORMAL HIGH (ref 65–99)
Glucose-Capillary: 161 mg/dL — ABNORMAL HIGH (ref 65–99)

## 2014-06-18 MED ORDER — ONDANSETRON HCL 4 MG PO TABS
4.0000 mg | ORAL_TABLET | Freq: Four times a day (QID) | ORAL | Status: AC
  Administered 2014-06-19: 4 mg via ORAL
  Filled 2014-06-18: qty 1

## 2014-06-18 MED ORDER — ONDANSETRON HCL 4 MG/2ML IJ SOLN
4.0000 mg | Freq: Four times a day (QID) | INTRAMUSCULAR | Status: AC
  Administered 2014-06-18 – 2014-06-19 (×5): 4 mg via INTRAVENOUS
  Filled 2014-06-18 (×5): qty 2

## 2014-06-18 NOTE — H&P (Signed)
Consultation  Referring Provider:     Dr Vianne Bulls Admit date: 06/15/14 Consult date    06/18/14     Reason for Consultation:     Intractable NV         HPI:   Dakota Bowen is a 34 y.o. male admitted with persistent NV. Onset last Monday. States he has not been able to hold any kind of food on his stomach: everything causes nausea and vomiting. Has developed epigastric pain that he attributes to the frequent vomiting, which he describes as yellow. States that he has recurrent cycles of this, about on a 31m cycle. States that he smokes 2 marijuana joints on a qd basis to help with this.  Denies NSAIDs. Reports also daily heartburn and indigestion- for which he takes Omeprazole 20mg  po qd-bid but this does not give him relief. States he wakes at night with acid regurgitation.  Denies problems swallowing, melena, unplanned weight loss, weight gain. States he has some rare diarrhea and every now and then will see some dark red blood with his stool, but hasn't seem some in several weeks.  Has had 4 Cts over since 2012 with the findings of possible intermittent large intestine colitis, but no other significant abnormalities. Last Ct on admission demonstrated hepatic steatosis and liver prominence. Symptomwise, states the antiemetics help, but without them, the nausea and vomiting return. Denies further GI complaints.   PREVIOUS ENDOSCOPIES:            2003 by Dr Ara Kussmaul with gastritis only 2012 by Dr Candace Cruise with La Grade A esophagitis, no other abnormalities. Colonoscopy- by patient report, some years ago- not done here, cant' remember who the provider was. Thinks this had negative results.  Past Medical History  Diagnosis Date  . GERD (gastroesophageal reflux disease)     History reviewed. No pertinent past surgical history.  Family History  Problem Relation Age of Onset  . Diabetes Mother   . Diabetes Father     History  Substance Use Topics  . Smoking status: Current Every Day Smoker --  1.50 packs/day  . Smokeless tobacco: Not on file  . Alcohol Use: Yes     Comment: 6-7 beers a week   Social history: daily marijuana, denies further illictis. Endorses 6 beers weekly of normal etoh content. Smokes cigarettes qd. Lives at home with family.   Prior to Admission medications   Medication Sig Start Date End Date Taking? Authorizing Provider  omeprazole (PRILOSEC OTC) 20 MG tablet Take 20 mg by mouth daily.   Yes Historical Provider, MD  promethazine (PHENERGAN) 12.5 MG tablet Take 1 tablet (12.5 mg total) by mouth every 6 (six) hours as needed for nausea or vomiting. 06/15/14   Lavonia Drafts, MD    Current Facility-Administered Medications  Medication Dose Route Frequency Provider Last Rate Last Dose  . 0.9 %  sodium chloride infusion   Intravenous Continuous Theodoro Grist, MD 75 mL/hr at 06/18/14 1040    . acetaminophen (TYLENOL) tablet 650 mg  650 mg Oral Q6H PRN Hillary Bow, MD   650 mg at 06/16/14 0529   Or  . acetaminophen (TYLENOL) suppository 650 mg  650 mg Rectal Q6H PRN Srikar Sudini, MD      . ondansetron (ZOFRAN) injection 4 mg  4 mg Intravenous Q4H PRN Theodoro Grist, MD   4 mg at 06/18/14 1319   Or  . ondansetron (ZOFRAN) tablet 4 mg  4 mg Oral Q6H PRN Theodoro Grist, MD      .  pantoprazole (PROTONIX) injection 40 mg  40 mg Intravenous Q12H Srikar Sudini, MD   40 mg at 06/18/14 1040  . promethazine (PHENERGAN) injection 12.5 mg  12.5 mg Intravenous Q4H PRN Theodoro Grist, MD   12.5 mg at 06/18/14 0557    Allergies as of 06/15/2014 - Review Complete 06/15/2014  Allergen Reaction Noted  . Penicillins Rash 06/15/2014     Review of Systems:    All systems reviewed and negative except where noted in HPI. Marland Kitchen    Physical Exam:  Vital signs in last 24 hours: Temp:  [98.4 F (36.9 C)-98.5 F (36.9 C)] 98.4 F (36.9 C) (06/03 0753) Pulse Rate:  [61-70] 70 (06/03 0753) Resp:  [18-20] 18 (06/03 0753) BP: (135-144)/(82-98) 135/94 mmHg (06/03 0753) SpO2:  [100 %]  100 % (06/03 0753) Weight:  [62.732 kg (138 lb 4.8 oz)] 62.732 kg (138 lb 4.8 oz) (06/03 0441) Last BM Date: 06/14/14 General:   Pleasant man in NAD Head:  Normocephalic and atraumatic. Eyes:   No icterus.   Conjunctiva pink. Ears:  Normal auditory acuity. Mouth: Mucosa pink moist, no lesions. Neck:  Supple; no masses felt Lungs: Respirations even and unlabored. Lungs clear to auscultation bilaterally.   No wheezes, crackles, or rhonchi.  Heart:  S1S2, RRR, no MRG. No edema. Abdomen:   Flat, soft, nondistended.Tenderness to the epigastric and RUQ areas. Normal bowel sounds. No appreciable masses or hepatomegaly. No rebound signs or other peritoneal signs. Msk:  MAEW x4, No clubbing or cyanosis. Strength 5/5. Symmetrical without gross deformities. Neurologic:  Alert and  oriented x4;  Cranial nerves II-XII intact.  Skin:  Several tatttoos. Warm, dry, pink without significant lesions or rashes. Psych:  Alert and cooperative. Normal affect.  LAB RESULTS:  Recent Labs  06/16/14 0721  WBC 10.7*  HGB 14.8  HCT 44.7  PLT 195   BMET  Na 139, K 4.1, Cl 100, co2 23, Glu 167, Bun 13, Cr 0.7, Ca 9.5, GFR >60    STUDIES:  CT 5/31/16scan demonstrating a prominent liver with steatosis, absent gallbladder. There was no bowel obstruction or other GI abnormalities.     Impression / Plan:   1. Dyspepsia: agree with bid PPI and fluids. Recommend scheduling his Zofran for the next 24-36h.  With his history of uncontrolled GERD despite PPI therapy and the alarm sign of nocturnal wakings with acid regurgitation, will discussed EGD with Dr Gustavo Lah. Did discuss the procedure/risks/benefits with the patient and he is agreeable to the procedure if ne be. Did also discuss his marijuana/tobacco  use and how this may contribute to his recurrent symptoms. Recommend cessation. Did also recommend routine GI care for his as o/p due to his chronic and recurrent symptoms- with his hx intermittent rectal  bleeding may need colonosopy when clinically feasible.  Will also assess and a1c due to his hyperglycemia and family history of DM Thank you very much for this consult. These services were provided by Stephens November, NP-C, in collaboration with Lollie Sails, MD, with whom I have discussed this patient in full.   Stephens November, NP-C  Addendum: as above- will assess lfts in am; EGD as clinically indicated- discussed with Dr Gustavo Lah

## 2014-06-18 NOTE — Progress Notes (Signed)
[place] at [place] NAME: Dakota Bowen    MR#:  [id number]  DATE OF BIRTH:  [birth year]  SUBJECTIVE: still vomiting,naseous.poor po intake.  CHIEF COMPLAINT:   Chief Complaint  Patient presents with  . Emesis    REVIEW OF SYSTEMS:    Review of Systems  Gastrointestinal: Positive for nausea and vomiting.    Nutrition: Liquid diet Tolerating Diet: No Tolerating PT:      DRUG ALLERGIES:   Allergies  Allergen Reactions  . Penicillins Rash    VITALS:  Blood pressure 135/94, pulse 70, temperature 98.4 F (36.9 C), temperature source Oral, resp. rate 18, height 6' (1.829 m), weight 62.732 kg (138 lb 4.8 oz), SpO2 100 %.  PHYSICAL EXAMINATION:   Physical Exam  GENERAL:  34 y.o.-year-old patient lying in the bed with no acute distress.  EYES: Pupils equal, round, reactive to light and accommodation. No scleral icterus. Extraocular muscles intact.  HEENT: Head atraumatic, normocephalic. Oropharynx and nasopharynx clear.  NECK:  Supple, no jugular venous distention. No thyroid enlargement, no tenderness.  LUNGS: Normal breath sounds bilaterally, no wheezing, rales,rhonchi or crepitation. No use of accessory muscles of respiration.  CARDIOVASCULAR: S1, S2 normal. No murmurs, rubs, or gallops.  ABDOMEN: Mild midepigastric tenderness present bowel sounds present no organomegaly EXTREMITIES: No pedal edema, cyanosis, or clubbing.  NEUROLOGIC: Cranial nerves II through XII are intact. Muscle strength 5/5 in all extremities. Sensation intact. Gait not checked.  PSYCHIATRIC: The patient is alert and oriented x 3.  SKIN: No obvious rash, lesion, or ulcer.    LABORATORY PANEL:   CBC  Recent Labs Lab 06/16/14 0721  WBC 10.7*  HGB 14.8  HCT 44.7  PLT 195   ------------------------------------------------------------------------------------------------------------------  Chemistries   Recent Labs Lab 06/15/14 0616   NA 139  K 4.1  CL 100*  CO2 23  GLUCOSE 167*  BUN 13  CREATININE 0.95  CALCIUM 9.5   ------------------------------------------------------------------------------------------------------------------  Cardiac Enzymes  Recent Labs Lab 06/15/14 0815  TROPONINI <0.03   ------------------------------------------------------------------------------------------------------------------  RADIOLOGY:  No results found.   ASSESSMENT AND PLAN:   1. An tractable nausea vomiting abdominal pain likely secondary to acute gastritis. Abdominal CAT scan is unremarkable. due to persistent nausea/vomting for 3 days will request GI consultation  Still nauseous,poor po intake,continue IV zofran,iv  Fluids  Unable to discharge due to intractable nausea/vomting  All the records are reviewed and case discussed with Care Management/Social Workerr. Management plans discussed with the patient, family and they are in agreement.  CODE STATUS: full code  TOTAL TIME TAKING CARE OF THIS PATIENT: 35  minutes.   POSSIBLE D/C IN 1DAYS, DEPENDING ON CLINICAL CONDITION.   Epifanio Lesches M.D on 06/18/2014 at 12:38 PM  Between 7am to 6pm - Pager - [phone]  After 6pm go to [email or web address] - password [email or web address] [facility]  [facility]  Office  [phone]  CC: Primary care physician; No PCP Per Patient

## 2014-06-18 NOTE — Consult Note (Signed)
Subjective: Patient seen for intractable nausea and vomiting patient seen and examined chart reviewed. Please see full GI consult by Mrs. London. Dakota Bowen is a 34 year old male setting with assistant vomiting over the period past 5 days. He states that he's had problems with intermittent episodes of nausea and vomiting the go back at least 15 years. Although he does use marijuana on a fairly regular basis currently he episodes of nausea and emesis actually go back quite a bit further. He states that he has about one hospitalized episode once a year but may have a smaller episode every 3 months for which she does not seek hospitalization her care. He states that sometimes it seems that his daily heartburn also seems to be a starting factor. He only occasionally takes a proton pump inhibitor or some Zantac. He states that a hot shower does seem to alleviate symptoms at times. He states he has heartburn every day. He was taking protonix for about 2 years not recently.  He states that when the emesis began on Monday and Tuesday he did see some blood mixed with the emesis but not since then. He has had an EGD, in fact several, over the past couple of years is last being in 2012 with a finding of grade a erosive esophagitis.  He also drinks about 6 beers weekly.  Objective: Vital signs in last 24 hours: Temp:  [98.4 F (36.9 C)-98.5 F (36.9 C)] 98.4 F (36.9 C) (06/03 1601) Pulse Rate:  [61-90] 90 (06/03 1601) Resp:  [18] 18 (06/03 0753) BP: (121-144)/(77-94) 121/77 mmHg (06/03 1601) SpO2:  [100 %] 100 % (06/03 1601) Weight:  [62.732 kg (138 lb 4.8 oz)] 62.732 kg (138 lb 4.8 oz) (06/03 0441) Blood pressure 121/77, pulse 90, temperature 98.4 F (36.9 C), temperature source Oral, resp. rate 18, height 6' (1.829 m), weight 62.732 kg (138 lb 4.8 oz), SpO2 100 %.   Intake/Output from previous day: 06/02 0701 - 06/03 0700 In: 1089 [P.O.:480; I.V.:549] Out: 725 [Urine:250; Emesis/NG  output:475]  Intake/Output this shift:     General appearance:  A 34 year old male no acute distress appears somewhat depressed. Resp:  Clear to auscultation Cardio:  Regular rate and rhythm GI:  Soft diffuse discomfort to palpation no rebound. Sounds are positive Extremities:  No clubbing cyanosis or edema   Lab Results: Results for orders placed or performed during the hospital encounter of 06/15/14 (from the past 24 hour(s))  Glucose, capillary     Status: Abnormal   Collection Time: 06/18/14  7:59 AM  Result Value Ref Range   Glucose-Capillary 171 (H) 65 - 99 mg/dL  Glucose, capillary     Status: Abnormal   Collection Time: 06/18/14 11:59 AM  Result Value Ref Range   Glucose-Capillary 160 (H) 65 - 99 mg/dL  Glucose, capillary     Status: Abnormal   Collection Time: 06/18/14  4:08 PM  Result Value Ref Range   Glucose-Capillary 161 (H) 65 - 99 mg/dL      Recent Labs  06/16/14 0721  WBC 10.7*  HGB 14.8  HCT 44.7  PLT 195   BMET No results for input(s): NA, K, CL, CO2, GLUCOSE, BUN, CREATININE, CALCIUM in the last 72 hours. LFT No results for input(s): PROT, ALBUMIN, AST, ALT, ALKPHOS, BILITOT, BILIDIR, IBILI in the last 72 hours. PT/INR No results for input(s): LABPROT, INR in the last 72 hours. Hepatitis Panel No results for input(s): HEPBSAG, HCVAB, HEPAIGM, HEPBIGM in the last 72 hours. C-Diff No results  for input(s): CDIFFTOX in the last 72 hours. No results for input(s): CDIFFPCR in the last 72 hours.   Studies/Results: No results found.  Scheduled Inpatient Medications:   . ondansetron (ZOFRAN) IV  4 mg Intravenous 4 times per day   Or  . ondansetron  4 mg Oral 4 times per day  . pantoprazole (PROTONIX) IV  40 mg Intravenous Q12H    Continuous Inpatient Infusions:   . sodium chloride 75 mL/hr at 06/18/14 1040    PRN Inpatient Medications:  acetaminophen **OR** acetaminophen, promethazine  Miscellaneous:   Assessment:  1. Intractable  nausea and vomiting. On his presentation and history this could possibly be cyclical vomiting syndrome. It could also be some involvement of the cannabinoids vomiting syndrome however believe his symptoms predate regular use and he uses more frequently than his symptoms occur.  Plan:  1. Would use scheduled Zofran 4 mg IV every 6 hours for about 24-36 hours rather than when necessary dosing. 2. Will order a hemoglobin A1c 3. We'll try to arrange for a EGD tomorrow as clinically feasible. I have discussed the risks benefits and complications of procedures to include not limited to bleeding, infection, perforation and the risk of sedation and the patient wishes to proceed.  Lollie Sails MD 06/18/2014, 8:59 PM

## 2014-06-19 ENCOUNTER — Encounter
Admission: EM | Disposition: A | Discharge status: Home / Self Care | Payer: Self-pay | Source: Home / Self Care | Attending: Internal Medicine

## 2014-06-19 LAB — COMPREHENSIVE METABOLIC PANEL
ALBUMIN: 4 g/dL (ref 3.5–5.0)
ALT: 20 U/L (ref 17–63)
ANION GAP: 6 (ref 5–15)
AST: 16 U/L (ref 15–41)
Alkaline Phosphatase: 43 U/L (ref 38–126)
BUN: 7 mg/dL (ref 6–20)
CALCIUM: 9 mg/dL (ref 8.9–10.3)
CHLORIDE: 104 mmol/L (ref 101–111)
CO2: 29 mmol/L (ref 22–32)
Creatinine, Ser: 0.97 mg/dL (ref 0.61–1.24)
GFR calc non Af Amer: >60 mL/min (ref >60)
Glucose, Bld: 94 mg/dL (ref 65–99)
Potassium: 3.8 mmol/L (ref 3.5–5.1)
Sodium: 139 mmol/L (ref 135–145)
Total Bilirubin: 1.2 mg/dL (ref 0.3–1.2)
Total Protein: 6.4 g/dL — ABNORMAL LOW (ref 6.5–8.1)

## 2014-06-19 LAB — CBC
HCT: 48.4 % (ref 40.0–52.0)
Hemoglobin: 16.8 g/dL (ref 13.0–18.0)
MCH: 32.9 pg (ref 26.0–34.0)
MCHC: 34.6 g/dL (ref 32.0–36.0)
MCV: 94.9 fL (ref 80.0–100.0)
Platelets: 201 10*3/uL (ref 150–440)
RBC: 5.1 MIL/uL (ref 4.40–5.90)
RDW: 13.4 % (ref 11.5–14.5)
WBC: 7.5 10*3/uL (ref 3.8–10.6)

## 2014-06-19 LAB — HEMOGLOBIN A1C: Hgb A1c MFr Bld: 4.9 % (ref 4.0–6.0)

## 2014-06-19 SURGERY — Surgical Case
Anesthesia: *Unknown

## 2014-06-19 SURGERY — EGD (ESOPHAGOGASTRODUODENOSCOPY)
Anesthesia: Choice

## 2014-06-19 NOTE — Consult Note (Signed)
Subjective: Patient seen for intractible n/v.  Some improvement, starting to tolerate some liquids.  Unable to do egd today due to scheduling/ consults.   Objective: Vital signs in last 24 hours: Temp:  [98.2 F (36.8 C)-98.7 F (37.1 C)] 98.3 F (36.8 C) (06/04 1605) Pulse Rate:  [62-78] 78 (06/04 1605) Resp:  [16] 16 (06/04 1605) BP: (122-132)/(78-93) 122/78 mmHg (06/04 1605) SpO2:  [100 %] 100 % (06/04 1605) Weight:  [63.73 kg (140 lb 8 oz)] 63.73 kg (140 lb 8 oz) (06/04 0500) Blood pressure 122/78, pulse 78, temperature 98.3 F (36.8 C), temperature source Oral, resp. rate 16, height 6' (1.829 m), weight 63.73 kg (140 lb 8 oz), SpO2 100 %.   Intake/Output from previous day: 06/03 0701 - 06/04 0700 In: 2779 [P.O.:238; I.V.:2541] Out: 700 [Urine:700]  Intake/Output this shift: Total I/O In: 216.3 [I.V.:216.3] Out: 400 [Urine:400]   General appearance:  No acute distress Resp: BCTA Cardio:  rrr GI:  Soft, mild diffuse tenderness, no masses rebound or organomegally Extremities:  No cce   Lab Results: Results for orders placed or performed during the hospital encounter of 06/15/14 (from the past 24 hour(s))  CBC     Status: None   Collection Time: 06/19/14  6:23 AM  Result Value Ref Range   WBC 7.5 3.8 - 10.6 K/uL   RBC 5.10 4.40 - 5.90 MIL/uL   Hemoglobin 16.8 13.0 - 18.0 g/dL   HCT 48.4 40.0 - 52.0 %   MCV 94.9 80.0 - 100.0 fL   MCH 32.9 26.0 - 34.0 pg   MCHC 34.6 32.0 - 36.0 g/dL   RDW 13.4 11.5 - 14.5 %   Platelets 201 150 - 440 K/uL  Comprehensive metabolic panel     Status: Abnormal   Collection Time: 06/19/14  6:23 AM  Result Value Ref Range   Sodium 139 135 - 145 mmol/L   Potassium 3.8 3.5 - 5.1 mmol/L   Chloride 104 101 - 111 mmol/L   CO2 29 22 - 32 mmol/L   Glucose, Bld 94 65 - 99 mg/dL   BUN 7 6 - 20 mg/dL   Creatinine, Ser 0.97 0.61 - 1.24 mg/dL   Calcium 9.0 8.9 - 10.3 mg/dL   Total Protein 6.4 (L) 6.5 - 8.1 g/dL   Albumin 4.0 3.5 - 5.0 g/dL   AST 16 15 - 41 U/L   ALT 20 17 - 63 U/L   Alkaline Phosphatase 43 38 - 126 U/L   Total Bilirubin 1.2 0.3 - 1.2 mg/dL   GFR calc non Af Amer >60 >60 mL/min   GFR calc Af Amer >60 >60 mL/min   Anion gap 6 5 - 15  Hemoglobin A1c     Status: None   Collection Time: 06/19/14  6:23 AM  Result Value Ref Range   Hgb A1c MFr Bld 4.9 4.0 - 6.0 %      Recent Labs  06/19/14 0623  WBC 7.5  HGB 16.8  HCT 48.4  PLT 201   BMET  Recent Labs  06/19/14 0623  NA 139  K 3.8  CL 104  CO2 29  GLUCOSE 94  BUN 7  CREATININE 0.97  CALCIUM 9.0   LFT  Recent Labs  06/19/14 0623  PROT 6.4*  ALBUMIN 4.0  AST 16  ALT 20  ALKPHOS 43  BILITOT 1.2   PT/INR No results for input(s): LABPROT, INR in the last 72 hours. Hepatitis Panel No results for input(s): HEPBSAG, HCVAB, [place], HEPBIGM  in the last 72 hours. C-Diff No results for input(s): CDIFFTOX in the last 72 hours. No results for input(s): CDIFFPCR in the last 72 hours.   Studies/Results: No results found.  Scheduled Inpatient Medications:   . ondansetron (ZOFRAN) IV  4 mg Intravenous 4 times per day   Or  . ondansetron  4 mg Oral 4 times per day  . pantoprazole (PROTONIX) IV  40 mg Intravenous Q12H    Continuous Inpatient Infusions:   . 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    Plan:  1) continue pantoprazole as you are, with scheduled antiemetics.  Planning egd for Monday.   Lollie Sails MD 06/19/2014, 5:50 PM

## 2014-06-19 NOTE — Progress Notes (Signed)
EGD not performed today due to rescheduling by MD. Pt tolerating clear liquids at this time, receiving scheduled and PRN antiemetics. VS WNL, no pain reported. Will continue to monitor.

## 2014-06-19 NOTE — Progress Notes (Signed)
[place] at [place] NAME: Dakota Bowen    MR#:  [id number]  DATE OF BIRTH:  Jan 25, 1980  SUBJECTIVE: still vomiting,naseous.poor po intake. scheduled for EGD today. He feels hungry but whenever he eats he feels nauseous and  feels like throwing up.   CHIEF COMPLAINT:   Chief Complaint  Patient presents with  . Emesis    REVIEW OF SYSTEMS:    Review of Systems  Constitutional: Negative for fever and chills.  HENT: Negative for hearing loss.   Eyes: Negative for blurred vision, double vision and photophobia.  Respiratory: Negative for cough, hemoptysis and shortness of breath.   Cardiovascular: Negative for palpitations, orthopnea and leg swelling.  Gastrointestinal: Positive for nausea. Negative for vomiting, abdominal pain and diarrhea.  Genitourinary: Negative for dysuria and urgency.  Musculoskeletal: Negative for myalgias and neck pain.  Skin: Negative for rash.  Neurological: Negative for dizziness, focal weakness, seizures, weakness and headaches.  Psychiatric/Behavioral: Negative for memory loss. The patient does not have insomnia.     Nutrition: Liquid diet Tolerating Diet: No Tolerating PT:      DRUG ALLERGIES:   Allergies  Allergen Reactions  . Penicillins Rash    VITALS:  Blood pressure 132/93, pulse 62, temperature 98.2 F (36.8 C), temperature source Oral, resp. rate 16, height 6' (1.829 m), weight 63.73 kg (140 lb 8 oz), SpO2 100 %.  PHYSICAL EXAMINATION:   Physical Exam  GENERAL:  34 y.o.-year-old patient lying in the bed with no acute distress.  EYES: Pupils equal, round, reactive to light and accommodation. No scleral icterus. Extraocular muscles intact.  HEENT: Head atraumatic, normocephalic. Oropharynx and nasopharynx clear.  NECK:  Supple, no jugular venous distention. No thyroid enlargement, no tenderness.  LUNGS: Normal breath sounds bilaterally, no wheezing, rales,rhonchi or crepitation.  No use of accessory muscles of respiration.  CARDIOVASCULAR: S1, S2 normal. No murmurs, rubs, or gallops.  ABDOMEN: Mild midepigastric tenderness present bowel sounds present no organomegaly EXTREMITIES: No pedal edema, cyanosis, or clubbing.  NEUROLOGIC: Cranial nerves II through XII are intact. Muscle strength 5/5 in all extremities. Sensation intact. Gait not checked.  PSYCHIATRIC: The patient is alert and oriented x 3.  SKIN: No obvious rash, lesion, or ulcer.    LABORATORY PANEL:   CBC  Recent Labs Lab 06/19/14 0623  WBC 7.5  HGB 16.8  HCT 48.4  PLT 201   ------------------------------------------------------------------------------------------------------------------  Chemistries   Recent Labs Lab 06/19/14 0623  NA 139  K 3.8  CL 104  CO2 29  GLUCOSE 94  BUN 7  CREATININE 0.97  CALCIUM 9.0  AST 16  ALT 20  ALKPHOS 43  BILITOT 1.2   ------------------------------------------------------------------------------------------------------------------  Cardiac Enzymes  Recent Labs Lab 06/15/14 0815  TROPONINI <0.03   ------------------------------------------------------------------------------------------------------------------  RADIOLOGY:  No results found.   ASSESSMENT AND PLAN:   1. An tractable nausea vomiting abdominal pain likely secondary to acute gastritis. Abdominal CAT scan is unremarkable. GI consultation is done EGD is pending. Continue PPIs IV fluids. Still nauseous,poor po intake,continue IV zofran,iv  Fluids  Unable to discharge due to intractable nausea/vomting  All the records are reviewed and case discussed with Care Management/Social Workerr. Management plans discussed with the patient, family and they are in agreement.  CODE STATUS: full code  TOTAL TIME TAKING CARE OF THIS PATIENT: 35  minutes.   POSSIBLE D/C IN 1DAYS, DEPENDING ON CLINICAL CONDITION.   Epifanio Lesches M.D on 06/19/2014 at 10:00 AM  Between 7am to  6pm -  Pager - [phone]  After 6pm go to [email or web address] - password [email or web address] [facility]  [place] [facility]  Office  [phone]  CC: Primary care physician; No PCP Per Patient

## 2014-06-20 NOTE — Consult Note (Signed)
Subjective: Patient seen for intractable nausea and vomiting. She is doing better today. He is tolerating a clear liquid diet. Episode of emesis was yesterday evening. His abdominal pain is improving.  Objective: Vital signs in last 24 hours: Temp:  [98.1 F (36.7 C)-98.3 F (36.8 C)] 98.1 F (36.7 C) (06/05 0756) Pulse Rate:  [71-78] 72 (06/05 0756) Resp:  [16-18] 16 (06/05 0756) BP: (106-122)/(76-79) 106/76 mmHg (06/05 0756) SpO2:  [90 %-100 %] 99 % (06/05 0756) Weight:  [64.456 kg (142 lb 1.6 oz)] 64.456 kg (142 lb 1.6 oz) (06/05 0500) Blood pressure 106/76, pulse 72, temperature 98.1 F (36.7 C), temperature source Oral, resp. rate 16, height 6' (1.829 m), weight 64.456 kg (142 lb 1.6 oz), SpO2 99 %.   Intake/Output from previous day: 06/04 0701 - 06/05 0700 In: 2213.9 [P.O.:240; I.V.:1973.9] Out: 1250 [Urine:1250]  Intake/Output this shift: Total I/O In: -  Out: 550 [Urine:550]   General appearance:  Well-appearing 34-year-old male no acute distress Resp:  Bilateral clear to auscultation Cardio:  Regular rate and rhythm GI:  Soft, mild tenderness to palpation. No masses or rebound Extremities:  No clubbing cyanosis or edema   Lab Results: No results found for this or any previous visit (from the past 24 hour(s)).    Recent Labs  06/19/14 0623  WBC 7.5  HGB 16.8  HCT 48.4  PLT 201   BMET  Recent Labs  06/19/14 0623  NA 139  K 3.8  CL 104  CO2 29  GLUCOSE 94  BUN 7  CREATININE 0.97  CALCIUM 9.0   LFT  Recent Labs  06/19/14 0623  PROT 6.4*  ALBUMIN 4.0  AST 16  ALT 20  ALKPHOS 43  BILITOT 1.2   PT/INR No results for input(s): LABPROT, INR in the last 72 hours. Hepatitis Panel No results for input(s): HEPBSAG, HCVAB, HEPAIGM, HEPBIGM in the last 72 hours. C-Diff No results for input(s): CDIFFTOX in the last 72 hours. No results for input(s): CDIFFPCR in the last 72 hours.   Studies/Results: No results found.  Scheduled Inpatient  Medications:   . pantoprazole (PROTONIX) IV  40 mg Intravenous Q12H    Continuous Inpatient Infusions:   . sodium chloride 75 mL/hr at 06/20/14 1129    PRN Inpatient Medications:  acetaminophen **OR** acetaminophen, promethazine  Miscellaneous:   Assessment:  1. History consistent with Probable cyclical vomiting syndrome. Currently improving.  Plan:  1. Continue current 2. Plan for EGD tomorrow. I have discussed the risks benefits and complications of procedures to include not limited to bleeding, infection, perforation and the risk of sedation and the patient wishes to proceed. 3. Would consider amitriptyline 25 mg at bedtime. Patient states that he has taken this medication in the past and tolerated it. Dose can be adjusted on GI follow-up.  Lollie Sails MD 06/20/2014, 3:45 PM

## 2014-06-20 NOTE — Progress Notes (Signed)
[place] at [place] NAME: Dakota Bowen    MR#:  [id number]  DATE OF BIRTH:  15-Jul-1980  SUBJECTIVE: Admitted for intractable nausea and vomiting. Patient received IV PPIs, Zofran, IV fluids for the past 3 days. Just started to get better today. Last vomiting was yesterday afternoon. Tolerating the liquid diet. EGD scheduled for tomorrow.   CHIEF COMPLAINT:   Chief Complaint  Patient presents with  . Emesis    REVIEW OF SYSTEMS:    Review of Systems  Constitutional: Negative for fever and chills.  HENT: Negative for hearing loss.   Eyes: Negative for blurred vision, double vision and photophobia.  Respiratory: Negative for cough, hemoptysis and shortness of breath.   Cardiovascular: Negative for palpitations, orthopnea and leg swelling.  Gastrointestinal: Negative for vomiting, abdominal pain and diarrhea.  Genitourinary: Negative for dysuria and urgency.  Musculoskeletal: Negative for myalgias and neck pain.  Skin: Negative for rash.  Neurological: Negative for dizziness, focal weakness, seizures, weakness and headaches.  Psychiatric/Behavioral: Negative for memory loss. The patient does not have insomnia.     Nutrition: Liquid diet Tolerating Diet: No Tolerating PT:      DRUG ALLERGIES:   Allergies  Allergen Reactions  . Penicillins Rash    VITALS:  Blood pressure 106/76, pulse 72, temperature 98.1 F (36.7 C), temperature source Oral, resp. rate 16, height 6' (1.829 m), weight 64.456 kg (142 lb 1.6 oz), SpO2 99 %.  PHYSICAL EXAMINATION:   Physical Exam  GENERAL:  34 y.o.-year-old patient lying in the bed with no acute distress.  EYES: Pupils equal, round, reactive to light and accommodation. No scleral icterus. Extraocular muscles intact.  HEENT: Head atraumatic, normocephalic. Oropharynx and nasopharynx clear.  NECK:  Supple, no jugular venous distention. No thyroid enlargement, no tenderness.  LUNGS:  Normal breath sounds bilaterally, no wheezing, rales,rhonchi or crepitation. No use of accessory muscles of respiration.  CARDIOVASCULAR: S1, S2 normal. No murmurs, rubs, or gallops.  ABDOMEN: Mild midepigastric tenderness present bowel sounds present no organomegaly EXTREMITIES: No pedal edema, cyanosis, or clubbing.  NEUROLOGIC: Cranial nerves II through XII are intact. Muscle strength 5/5 in all extremities. Sensation intact. Gait not checked.  PSYCHIATRIC: The patient is alert and oriented x 3.  SKIN: No obvious rash, lesion, or ulcer.    LABORATORY PANEL:   CBC  Recent Labs Lab 06/19/14 0623  WBC 7.5  HGB 16.8  HCT 48.4  PLT 201   ------------------------------------------------------------------------------------------------------------------  Chemistries   Recent Labs Lab 06/19/14 0623  NA 139  K 3.8  CL 104  CO2 29  GLUCOSE 94  BUN 7  CREATININE 0.97  CALCIUM 9.0  AST 16  ALT 20  ALKPHOS 43  BILITOT 1.2   ------------------------------------------------------------------------------------------------------------------  Cardiac Enzymes  Recent Labs Lab 06/15/14 0815  TROPONINI <0.03   ------------------------------------------------------------------------------------------------------------------  RADIOLOGY:  No results found.   ASSESSMENT AND PLAN:   1. An tractable nausea vomiting abdominal pain likely secondary to acute gastritis.vs , Possible cyclic vomiting ;Abdominal CAT scan is unremarkable. GI consultation is done EGD is pending. Continue PPIs IV fluids. , Continue clear liquids, EGD tomorrow  Likely discharge after EGD in 1 day All the records are reviewed and case discussed with Care Management/Social Workerr. Management plans discussed with the patient, family and they are in agreement.  CODE STATUS: full code  TOTAL TIME TAKING CARE OF THIS PATIENT: 35  minutes.   POSSIBLE D/C IN 1DAYS, DEPENDING ON CLINICAL  CONDITION.  Epifanio Lesches M.D on 06/20/2014 at 8:50 AM  Between 7am to 6pm - Pager - [phone]  After 6pm go to [email or web address] - password [email or web address] [facility]  [place] [facility]  Office  [phone]  CC: Primary care physician; No PCP Per Patient

## 2014-06-21 ENCOUNTER — Inpatient Hospital Stay: Payer: Self-pay | Admitting: Anesthesiology

## 2014-06-21 ENCOUNTER — Encounter
Admission: EM | Disposition: A | Discharge status: Home / Self Care | Payer: Self-pay | Source: Home / Self Care | Attending: Internal Medicine

## 2014-06-21 ENCOUNTER — Inpatient Hospital Stay: Payer: MEDICAID | Admitting: Anesthesiology

## 2014-06-21 ENCOUNTER — Encounter: Payer: Self-pay | Admitting: *Deleted

## 2014-06-21 HISTORY — PX: ESOPHAGOGASTRODUODENOSCOPY (EGD) WITH PROPOFOL: SHX5813

## 2014-06-21 SURGERY — ESOPHAGOGASTRODUODENOSCOPY (EGD) WITH PROPOFOL
Anesthesia: General

## 2014-06-21 SURGERY — EGD (ESOPHAGOGASTRODUODENOSCOPY)
Anesthesia: Monitor Anesthesia Care

## 2014-06-21 MED ORDER — LIDOCAINE HCL (CARDIAC) 20 MG/ML IV SOLN
INTRAVENOUS | Status: DC | PRN
  Administered 2014-06-21: 40 mg via INTRAVENOUS

## 2014-06-21 MED ORDER — GLYCOPYRROLATE 0.2 MG/ML IJ SOLN
INTRAMUSCULAR | Status: DC | PRN
  Administered 2014-06-21: 0.1 mg via INTRAVENOUS

## 2014-06-21 MED ORDER — PROPOFOL INFUSION 10 MG/ML OPTIME
INTRAVENOUS | Status: DC | PRN
  Administered 2014-06-21: 140 ug/kg/min via INTRAVENOUS

## 2014-06-21 MED ORDER — MIDAZOLAM HCL 2 MG/2ML IJ SOLN
INTRAMUSCULAR | Status: DC | PRN
  Administered 2014-06-21: 2 mg via INTRAVENOUS

## 2014-06-21 MED ORDER — FENTANYL CITRATE (PF) 100 MCG/2ML IJ SOLN
INTRAMUSCULAR | Status: DC | PRN
  Administered 2014-06-21: 50 ug via INTRAVENOUS

## 2014-06-21 NOTE — Transfer of Care (Signed)
Immediate Anesthesia Transfer of Care Note  Patient: Dakota Bowen  Procedure(s) Performed: Procedure(s): ESOPHAGOGASTRODUODENOSCOPY (EGD) WITH PROPOFOL. Looking in the esophagus stomach and upper small intestine with a lighted tube to examine and treat. (N/A)  Patient Location: PACU  Anesthesia Type:General  Level of Consciousness: awake and sedated  Airway & Oxygen Therapy: Patient Spontanous Breathing and Patient connected to nasal cannula oxygen  Post-op Assessment: Report given to RN and Post -op Vital signs reviewed and stable  Post vital signs: Reviewed and stable  Last Vitals:  Filed Vitals:   06/21/14 1342  BP: 114/77  Pulse: 62  Temp: 36.2 C  Resp: 16    Complications: No apparent anesthesia complications

## 2014-06-21 NOTE — Consult Note (Signed)
Subjective: Patient seen for intractable nausea and vomiting. Patient is been slowly improving. His abdominal pain is lasts. He had some mild nausea over the course of today however no emesis since last night.  Objective: Vital signs in last 24 hours: Temp:  [97.2 F (36.2 C)-97.8 F (36.6 C)] 97.2 F (36.2 C) (06/06 1342) Pulse Rate:  [59-142] 62 (06/06 1342) Resp:  [16-17] 16 (06/06 1342) BP: (106-118)/(73-84) 114/77 mmHg (06/06 1342) SpO2:  [98 %-100 %] 100 % (06/06 1342) Weight:  [63.957 kg (141 lb)] 63.957 kg (141 lb) (06/06 0730) Blood pressure 114/77, pulse 62, temperature 97.2 F (36.2 C), temperature source Tympanic, resp. rate 16, height 6' (1.829 m), weight 63.957 kg (141 lb), SpO2 100 %.   Intake/Output from previous day: 06/05 0701 - 06/06 0700 In: 2058.8 [P.O.:240; I.V.:1818.8] Out: 1250 [Urine:1250]  Intake/Output this shift: Total I/O In: 382.5 [I.V.:382.5] Out: 500 [Urine:500]   General appearance:  Well-appearing 34 year old male no acute distress Resp:  Bilaterally clear to auscultation Cardio:  Regular rate and rhythm GI:  Soft nondistended bowel sounds positive and normoactive. There are no masses rebound or organomegaly. There is a mild diffuse tenderness to palpation. Extremities:  No clubbing cyanosis or edema   Lab Results: No results found for this or any previous visit (from the past 24 hour(s)).    Recent Labs  06/19/14 0623  WBC 7.5  HGB 16.8  HCT 48.4  PLT 201   BMET  Recent Labs  06/19/14 0623  NA 139  K 3.8  CL 104  CO2 29  GLUCOSE 94  BUN 7  CREATININE 0.97  CALCIUM 9.0   LFT  Recent Labs  06/19/14 0623  PROT 6.4*  ALBUMIN 4.0  AST 16  ALT 20  ALKPHOS 43  BILITOT 1.2   PT/INR No results for input(s): LABPROT, INR in the last 72 hours. Hepatitis Panel No results for input(s): HEPBSAG, HCVAB, HEPAIGM, HEPBIGM in the last 72 hours. C-Diff No results for input(s): CDIFFTOX in the last 72 hours. No results for  input(s): CDIFFPCR in the last 72 hours.   Studies/Results: No results found.  Scheduled Inpatient Medications:   . [MAR Hold] pantoprazole (PROTONIX) IV  40 mg Intravenous Q12H    Continuous Inpatient Infusions:   . sodium chloride 75 mL/hr at 06/20/14 1129    PRN Inpatient Medications:  [MAR Hold] acetaminophen **OR** [MAR Hold] acetaminophen, [MAR Hold] promethazine  Miscellaneous:   Assessment:  1. Intractable nausea and vomiting. Probable cyclical vomiting syndrome. Slow improvement.  Plan:  1. Continue current. 2. EGD today. I have discussed the risks benefits and complications of procedures to include not limited to bleeding, infection, perforation and the risk of sedation and the patient wishes to proceed.  Lollie Sails MD 06/21/2014, 2:31 PM

## 2014-06-21 NOTE — Progress Notes (Signed)
[place] at [place] NAME: Dakota Bowen    MR#:  [id number]  DATE OF BIRTH:  Jul 15, 1980    CHIEF COMPLAINT:   Chief Complaint  Patient presents with  . Emesis   Stomach feeling "shakey". Is hungry, on clears for 2 days. Awaiting EGD.  REVIEW OF SYSTEMS:    Review of Systems  Constitutional: Negative for fever.  Respiratory: Negative for shortness of breath.   Cardiovascular: Negative for chest pain and palpitations.  Gastrointestinal: Positive for nausea. Negative for vomiting, abdominal pain and diarrhea.  Genitourinary: Negative for dysuria.   DRUG ALLERGIES:   Allergies  Allergen Reactions  . Penicillins Rash    VITALS:  Blood pressure 118/77, pulse 64, temperature 97.8 F (36.6 C), temperature source Oral, resp. rate 16, height 6' (1.829 m), weight 63.957 kg (141 lb), SpO2 98 %.  PHYSICAL EXAMINATION:   Physical Exam  GENERAL:  34 y.o.-year-old patient lying in the bed with no acute distress.  EYES: Pupils equal, round, reactive to light and accommodation. No scleral icterus. Extraocular muscles intact.  HEENT: Head atraumatic, normocephalic. Oropharynx and nasopharynx clear.  NECK:  Supple, no jugular venous distention. No thyroid enlargement, no tenderness.  LUNGS: Normal breath sounds bilaterally, no wheezing, rales,rhonchi or crepitation. No use of accessory muscles of respiration.  CARDIOVASCULAR: S1, S2 normal. No murmurs, rubs, or gallops.  ABDOMEN: Mild midepigastric tenderness present bowel sounds present no organomegaly EXTREMITIES: No pedal edema, cyanosis, or clubbing.  NEUROLOGIC: Cranial nerves II through XII are intact. Muscle strength 5/5 in all extremities. Sensation intact. Gait not checked.  PSYCHIATRIC: The patient is alert and oriented x 3.  SKIN: No obvious rash, lesion, or ulcer.    LABORATORY PANEL:   CBC  Recent Labs Lab 06/19/14 0623  WBC 7.5  HGB 16.8  HCT 48.4  PLT 201    ------------------------------------------------------------------------------------------------------------------  Chemistries   Recent Labs Lab 06/19/14 0623  NA 139  K 3.8  CL 104  CO2 29  GLUCOSE 94  BUN 7  CREATININE 0.97  CALCIUM 9.0  AST 16  ALT 20  ALKPHOS 43  BILITOT 1.2   ------------------------------------------------------------------------------------------------------------------  Cardiac Enzymes  Recent Labs Lab 06/15/14 0815  TROPONINI <0.03   ------------------------------------------------------------------------------------------------------------------  RADIOLOGY:  No results found.   ASSESSMENT AND PLAN:   1. Cyclical vomiting syndrome - reports episodes every 6 months or so - CT unremarkable - EGD planned for today by Dr. Donnella Sham - NPO for study, clears prior to this, will advance diet after procedure  All the records are reviewed and case discussed with Care Management/Social Workerr. Management plans discussed with the patient, family and they are in agreement.  CODE STATUS: full code  TOTAL TIME TAKING CARE OF THIS PATIENT: 35  minutes.   POSSIBLE D/C IN 1DAYS, DEPENDING ON CLINICAL CONDITION. Possible DC if can tolerate meal after EGD.   Myrtis Ser M.D on 06/21/2014 at 12:35 PM  Between 7am to 6pm - Pager - [phone]  After 6pm go to [email or web address] - password [email or web address] [facility]  [place] [facility]  Office  [phone]  CC: Primary care physician; No PCP Per Patient

## 2014-06-21 NOTE — Op Note (Signed)
[facility] Patient Name: Dakota Bowen Procedure Date: 06/21/2014 2:27 PM MRN: [id number] Account #: [id number] Date of Birth: September 22, 1980 Admit Type: Inpatient Age: 34 Room: [facility] ENDO ROOM 3 Gender: Male Note Status: Finalized Procedure:         Upper GI endoscopy Indications:       Nausea with vomiting Providers:         Lollie Sails, MD Referring MD:      No Local Md, MD (Referring MD) Medicines:         Monitored Anesthesia Care Complications:     No immediate complications. Procedure:         Pre-Anesthesia Assessment:                    - ASA Grade Assessment: II - A patient with mild systemic                     disease.                    After obtaining informed consent, the endoscope was passed                     under direct vision. Throughout the procedure, the                     patient's blood pressure, pulse, and oxygen saturations                     were monitored continuously. The Olympus GIF-160 endoscope                     (S#. [id number]) was introduced through the mouth, and                     advanced to the third part of duodenum. The upper GI                     endoscopy was accomplished without difficulty. The patient                     tolerated the procedure well. Findings:      The Z-line was variable. Biopsies were taken with a cold forceps for       histology.      The entire examined stomach was normal. Biopsies were taken with a cold       forceps for histology. Biopsies were taken with a cold forceps for       Helicobacter pylori testing.      Diffuse mucosal flattening was found in the entire examined duodenum.       Biopsies were taken with a cold forceps for histology.      Patchy mild inflammation characterized by congestion (edema) and       erythema was found in the duodenal bulb.      The cardia and gastric fundus were normal on retroflexion. Impression:        - Z-line variable. Biopsied.                 - Normal stomach. Biopsied.                    - Flattened mucosa was found in the duodenum, not  consistent with celiac disease. Biopsied.                    - Duodenitis. Recommendation:    - Continue present medications. Procedure Code(s): --- Professional ---                    [phone], Esophagogastroduodenoscopy, flexible, transoral;                     with biopsy, single or multiple Diagnosis Code(s): --- Professional ---                    530.89, Other specified disorders of esophagus                    537.9, Unspecified disorder of stomach and duodenum                    535.60, Duodenitis, without mention of hemorrhage                    787.01, Nausea with vomiting CPT copyright 2014 [employer]. All rights reserved. The codes documented in this report are preliminary and upon coder review may  be revised to meet current compliance requirements. Lollie Sails, MD 06/21/2014 3:00:07 PM This report has been signed electronically. Number of Addenda: 0 Note Initiated On: 06/21/2014 2:27 PM      [facility]

## 2014-06-21 NOTE — Anesthesia Preprocedure Evaluation (Signed)
Anesthesia Evaluation  Patient identified by MRN, date of birth, ID band Patient awake    Reviewed: Allergy & Precautions, NPO status , Patient's Chart, lab work & pertinent test results  Airway Mallampati: II  TM Distance: >3 FB     Dental  (+) Chipped, Poor Dentition   Pulmonary Current Smoker,          Cardiovascular     Neuro/Psych    GI/Hepatic GERD-  Controlled,  Endo/Other    Renal/GU      Musculoskeletal   Abdominal   Peds  Hematology   Anesthesia Other Findings   Reproductive/Obstetrics                             Anesthesia Physical Anesthesia Plan  ASA: II  Anesthesia Plan: General   Post-op Pain Management:    Induction: Intravenous  Airway Management Planned: Nasal Cannula  Additional Equipment:   Intra-op Plan:   Post-operative Plan:   Informed Consent: I have reviewed the patients History and Physical, chart, labs and discussed the procedure including the risks, benefits and alternatives for the proposed anesthesia with the patient or authorized representative who has indicated his/her understanding and acceptance.     Plan Discussed with:   Anesthesia Plan Comments:         Anesthesia Quick Evaluation

## 2014-06-21 NOTE — Anesthesia Postprocedure Evaluation (Signed)
  Anesthesia Post-op Note  Patient: Dakota Bowen  Procedure(s) Performed: Procedure(s): ESOPHAGOGASTRODUODENOSCOPY (EGD) WITH PROPOFOL. Looking in the esophagus stomach and upper small intestine with a lighted tube to examine and treat. (N/A)  Anesthesia type:General  Patient location: PACU  Post pain: Pain level controlled  Post assessment: Post-op Vital signs reviewed, Patient's Cardiovascular Status Stable, Respiratory Function Stable, Patent Airway and No signs of Nausea or vomiting  Post vital signs: Reviewed and stable  Last Vitals:  Filed Vitals:   06/21/14 1501  BP: 95/55  Pulse:   Temp: 36.4 C  Resp:     Level of consciousness: awake, alert  and patient cooperative  Complications: No apparent anesthesia complications

## 2014-06-21 NOTE — Progress Notes (Signed)
Initial Nutrition Assessment  INTERVENTION:  Meals and Snacks: Cater to patient preferences once able to progress diet order  Coordination of Care: pt may benefit from stronger bowel regimen secondary to last BM recorded 5/30    NUTRITION DIAGNOSIS:  Inadequate oral intake related to inability to eat as evidenced by NPO status.  GOAL:   (Goal for diet advancement as medically able within 5-7 days)  MONITOR:   (Energy Intake, Digestive System, Anthropometrics)  REASON FOR ASSESSMENT:   (Length of Stay)    ASSESSMENT:  Pt admitted with intractible n/v likely secondary to acute gastritis per MD note. Pt continued with n/v since admission, GI consulted. EGD scheduled for today. PMHx: Past Medical History  Diagnosis Date  . GERD (gastroesophageal reflux disease)    Current Nutrition: pt NPO for procedure. RD notes pt on Soft diet order until 6/4 and then CL yesterday. Recorded po intake 75-100% of meals on 6/2 and 100% of CL including last night for dinner.  Nutrition Prior to Admission: pt reports good appetite PTA, eating mostly 2 meals a day as pt reports not eating breakfast on a normal basis.  Last BM:  5/30 Digestive system: last emesis recorded 5/31, 6/2  Medications: Protonix, NS at 25mL/hr Labs: Electrolyte and Renal Profile:  Recent Labs Lab 06/15/14 0616 06/19/14 0623  BUN 13 7  CREATININE 0.95 0.97  NA 139 139  K 4.1 3.8   Glucose Profile:  Recent Labs  06/18/14 1608  GLUCAP 161*   Protein Profile:  Recent Labs Lab 06/19/14 0623  ALBUMIN 4.0   Pt reports UBW PTA of 156lbs. Pt reports weight loss of 14lbs since admission. RD notes weight on 5/31 of 135lbs and 141lbs.  Weight Trend since admission: Filed Weights   06/19/14 0500 06/20/14 0500 06/21/14 0730  Weight: 140 lb 8 oz (63.73 kg) 142 lb 1.6 oz (64.456 kg) 141 lb (63.957 kg)   Height:  Ht Readings from Last 1 Encounters:  06/15/14 6' (1.829 m)    Weight:  Wt Readings from Last  1 Encounters:  06/21/14 141 lb (63.957 kg)    Wt Readings from Last 10 Encounters:  06/21/14 141 lb (63.957 kg)    BMI:  Body mass index is 19.12 kg/(m^2).   Nutrition-Focused physical exam completed. Findings are WDL for fat depletion, muscle depletion, and edema.    Estimated Nutritional Needs:  Kcal:  BEE: 1607kcals, TEE: (IF 1.1-1.3)(AF 1.2) 2121-2506kcals  Protein:  64-78g protein (1.0-1.2g/kg)  Fluid:  1598-1951mL of fluid (25-67mL/kg)  Skin:  Reviewed, no issues  Diet Order:  Diet NPO time specified Except for: Sips with Meds  EDUCATION NEEDS:  Education needs no appropriate at this time   Intake/Output Summary (Last 24 hours) at 06/21/14 1244 Last data filed at 06/21/14 1006  Gross per 24 hour  Intake 2441.25 ml  Output   1200 ml  Net 1241.25 ml    MODERATE Care Level  Dwyane Luo, RD, LDN Pager [phone]

## 2014-06-22 DIAGNOSIS — K2980 Duodenitis without bleeding: Secondary | ICD-10-CM | POA: Diagnosis present

## 2014-06-22 LAB — CBC
HCT: 46.2 % (ref 40.0–52.0)
Hemoglobin: 15.4 g/dL (ref 13.0–18.0)
MCH: 32.1 pg (ref 26.0–34.0)
MCHC: 33.4 g/dL (ref 32.0–36.0)
MCV: 96.2 fL (ref 80.0–100.0)
Platelets: 251 10*3/uL (ref 150–440)
RBC: 4.8 MIL/uL (ref 4.40–5.90)
RDW: 13.3 % (ref 11.5–14.5)
WBC: 7.9 10*3/uL (ref 3.8–10.6)

## 2014-06-22 LAB — BASIC METABOLIC PANEL
Anion gap: 4 — ABNORMAL LOW (ref 5–15)
BUN: 6 mg/dL (ref 6–20)
CALCIUM: 8.8 mg/dL — AB (ref 8.9–10.3)
CO2: 30 mmol/L (ref 22–32)
CREATININE: 0.96 mg/dL (ref 0.61–1.24)
Chloride: 108 mmol/L (ref 101–111)
GFR calc non Af Amer: >60 mL/min (ref >60)
GLUCOSE: 92 mg/dL (ref 65–99)
POTASSIUM: 4 mmol/L (ref 3.5–5.1)
SODIUM: 142 mmol/L (ref 135–145)

## 2014-06-22 MED ORDER — ONDANSETRON 4 MG PO TBDP: 4.0000 mg | ORAL_TABLET | Freq: Three times a day (TID) | ORAL | Status: DC | PRN

## 2014-06-22 MED ORDER — PANTOPRAZOLE SODIUM 40 MG PO TBEC: 40.0000 mg | DELAYED_RELEASE_TABLET | Freq: Two times a day (BID) | ORAL | Status: DC

## 2014-06-22 NOTE — Progress Notes (Signed)
Spoke to pt and Dr. Volanda Napoleon over the phone. Pt tolerated soft diet. No abd pain or n/v for the last hour after eating. Pt stated, "I feel normal." Plan for d/c per Dr. Volanda Napoleon.

## 2014-06-22 NOTE — Discharge Instructions (Signed)
°  DIET:  Regular diet, soft, bland foods  DISCHARGE CONDITION:  Fair  ACTIVITY:  Activity as tolerated  OXYGEN:  Home Oxygen: No.   Oxygen Delivery: room air  DISCHARGE LOCATION:  home   If you experience worsening of your admission symptoms, develop shortness of breath, life threatening emergency, suicidal or homicidal thoughts you must seek medical attention immediately by calling 911 or calling your MD immediately  if symptoms less severe.  You Must read complete instructions/literature along with all the possible adverse reactions/side effects for all the Medicines you take and that have been prescribed to you. Take any new Medicines after you have completely understood and accpet all the possible adverse reactions/side effects.   Please note  You were cared for by a hospitalist during your hospital stay. If you have any questions about your discharge medications or the care you received while you were in the hospital after you are discharged, you can call the unit and asked to speak with the hospitalist on call if the hospitalist that took care of you is not available. Once you are discharged, your primary care physician will handle any further medical issues. Please note that NO REFILLS for any discharge medications will be authorized once you are discharged, as it is imperative that you return to your primary care physician (or establish a relationship with a primary care physician if you do not have one) for your aftercare needs so that they can reassess your need for medications and monitor your lab values.     Gastritis, Adult Gastritis is soreness and puffiness (inflammation) of the lining of the stomach. If you do not get help, gastritis can cause bleeding and sores (ulcers) in the stomach. HOME CARE   Only take medicine as told by your doctor.  If you were given antibiotic medicines, take them as told. Finish the medicines even if you start to feel better.  Drink  enough fluids to keep your pee (urine) clear or pale yellow.  Avoid foods and drinks that make your problems worse. Foods you may want to avoid include:  Caffeine or alcohol.  Chocolate.  Mint.  Garlic and onions.  Spicy foods.  Citrus fruits, including oranges, lemons, or limes.  Food containing tomatoes, including sauce, chili, salsa, and pizza.  Fried and fatty foods.  Eat small meals throughout the day instead of large meals. GET HELP RIGHT AWAY IF:   You have black or dark red poop (stools).  You throw up (vomit) blood. It may look like coffee grounds.  You cannot keep fluids down.  Your belly (abdominal) pain gets worse.  You have a fever.  You do not feel better after 1 week.  You have any other questions or concerns. MAKE SURE YOU:   Understand these instructions.  Will watch your condition.  Will get help right away if you are not doing well or get worse. Document Released: 06/20/2007 Document Revised: 03/26/2011 Document Reviewed: 02/14/2011 [facility] Patient Information 2015 [place], [place]. This information is not intended to replace advice given to you by your health care provider. Make sure you discuss any questions you have with your health care provider.

## 2014-06-22 NOTE — Discharge Summary (Signed)
[place] [facility] [place]   PATIENT NAME: Dakota Bowen    MR#:  [id number]  DATE OF BIRTH:  12-08-1980  DATE OF ADMISSION:  06/15/2014 ADMITTING PHYSICIAN: Hillary Bow, MD  DATE OF DISCHARGE: 06/22/2014  PRIMARY CARE PHYSICIAN: No PCP Per Patient    ADMISSION DIAGNOSIS:  Gastritis [K29.70] Intractable vomiting with nausea, vomiting of unspecified type [R11.10]  DISCHARGE DIAGNOSIS:  Active Problems:   Vomiting   Gastritis   Duodenitis   SECONDARY DIAGNOSIS:   Past Medical History  Diagnosis Date  . GERD (gastroesophageal reflux disease)     HOSPITAL COURSE:   Active Problems:   Vomiting   Gastritis   Duodenitis  #1 nausea and vomiting: Patient has history of severe episodes of intractable vomiting approximately every 6 months. He was followed by gastroenterology throughout the admission. He had an EGD showing duodenitis, pathology still pending. He was treated with antiemetics and PPIs during hospitalization. Symptoms seem to be improving and at the time of discharge she is tolerating a soft diet. He does use marijuana, but this is not thought to be the cause of his symptoms. Moderate alcohol consumption. No non-steroidal anti-inflammatory's. He will follow up with gastroenterology in 2-3 weeks after discharge. Renal function and electrolytes were stable throughout the hospitalization.   DISCHARGE CONDITIONS:   Fair  CONSULTS OBTAINED:  Treatment Team:  Lollie Sails, MD  DRUG ALLERGIES:   Allergies  Allergen Reactions  . Penicillins Rash    DISCHARGE MEDICATIONS:   Current Discharge Medication List    START taking these medications   Details  ondansetron (ZOFRAN ODT) 4 MG disintegrating tablet Take 1 tablet (4 mg total) by mouth every 8 (eight) hours as needed for nausea or vomiting. Qty: 20 tablet, Refills: 0    pantoprazole (PROTONIX) 40 MG tablet Take 1 tablet (40 mg total) by mouth 2  (two) times daily. Qty: 60 tablet, Refills: 0    promethazine (PHENERGAN) 12.5 MG tablet Take 1 tablet (12.5 mg total) by mouth every 6 (six) hours as needed for nausea or vomiting. Qty: 20 tablet, Refills: 0      STOP taking these medications     omeprazole (PRILOSEC OTC) 20 MG tablet          DISCHARGE INSTRUCTIONS:     DIET:  Regular diet, soft, bland foods  DISCHARGE CONDITION:  Fair  ACTIVITY:  Activity as tolerated  OXYGEN:  Home Oxygen: No.   Oxygen Delivery: room air  DISCHARGE LOCATION:  home    If you experience worsening of your admission symptoms, develop shortness of breath, life threatening emergency, suicidal or homicidal thoughts you must seek medical attention immediately by calling 911 or calling your MD immediately  if symptoms less severe.  You Must read complete instructions/literature along with all the possible adverse reactions/side effects for all the Medicines you take and that have been prescribed to you. Take any new Medicines after you have completely understood and accept all the possible adverse reactions/side effects.   Please note  You were cared for by a hospitalist during your hospital stay. If you have any questions about your discharge medications or the care you received while you were in the hospital after you are discharged, you can call the unit and asked to speak with the hospitalist on call if the hospitalist that took care of you is not available. Once you are discharged, your primary care physician will handle any further medical issues. Please  note that NO REFILLS for any discharge medications will be authorized once you are discharged, as it is imperative that you return to your primary care physician (or establish a relationship with a primary care physician if you do not have one) for your aftercare needs so that they can reassess your need for medications and monitor your lab values.    Today   CHIEF COMPLAINT:    Chief Complaint  Patient presents with  . Emesis   Tolerating regular diet. No abdominal pain.  HISTORY OF PRESENT ILLNESS:  Dakota Bowen is a 34 y.o. male with a known history of GERD presents to the emergency room with intractable nausea and vomiting. Symptoms started yesterday afternoon. Patient has had multiple episodes of vomiting including a few here in the emergency room. No blood or abdominal pain. No constipation or diarrhea. Patient had similar symptoms in the past with last episode about 6 months back which resolved in 2-3 days. He mentions that he is supposed to be on Prilosec for GERD which he hasn't been taking. No sick contacts. No recent change in medications.  He did use marijuana about 5 days back. He normally drinks about 2 beers every other day. No recent binging. No history of abdominal surgeries.  CT scan of the abdomen done in the emergency room showed nothing acute.   VITAL SIGNS:  Blood pressure 107/77, pulse 59, temperature 97.7 F (36.5 C), temperature source Oral, resp. rate 18, height 6' (1.829 m), weight 63.458 kg (139 lb 14.4 oz), SpO2 99 %.  I/O:   Intake/Output Summary (Last 24 hours) at 06/22/14 1535 Last data filed at 06/22/14 0947  Gross per 24 hour  Intake   1607 ml  Output   2050 ml  Net   -443 ml    PHYSICAL EXAMINATION:  GENERAL:  34 y.o.-year-old patient lying in the bed with no acute distress.  EYES: Pupils equal, round, reactive to light and accommodation. No scleral icterus. Extraocular muscles intact.  HEENT: Head atraumatic, normocephalic. Oropharynx and nasopharynx clear.  NECK:  Supple, no jugular venous distention. No thyroid enlargement, no tenderness.  LUNGS: Normal breath sounds bilaterally, no wheezing, rales,rhonchi or crepitation. No use of accessory muscles of respiration.  CARDIOVASCULAR: S1, S2 normal. No murmurs, rubs, or gallops.  ABDOMEN: Soft, non-tender, non-distended. Bowel sounds present. No organomegaly or  mass.  EXTREMITIES: No pedal edema, cyanosis, or clubbing.  NEUROLOGIC: Cranial nerves II through XII are intact. Muscle strength 5/5 in all extremities. Sensation intact. Gait not checked.  PSYCHIATRIC: The patient is alert and oriented x 3.  SKIN: No obvious rash, lesion, or ulcer.   DATA REVIEW:   CBC  Recent Labs Lab 06/22/14 0532  WBC 7.9  HGB 15.4  HCT 46.2  PLT 251    Chemistries   Recent Labs Lab 06/19/14 0623 06/22/14 0532  NA 139 142  K 3.8 4.0  CL 104 108  CO2 29 30  GLUCOSE 94 92  BUN 7 6  CREATININE 0.97 0.96  CALCIUM 9.0 8.8*  AST 16  --   ALT 20  --   ALKPHOS 43  --   BILITOT 1.2  --     Cardiac Enzymes No results for input(s): TROPONINI in the last 168 hours.  Microbiology Results  Results for orders placed or performed in visit on 05/16/11  Urine culture     Status: None   Collection Time: 05/16/11  1:37 PM  Result Value Ref Range Status   Micro Text Report  Final       SOURCE: CLEAN CATCH    COMMENT                   MIXED BACTERIAL ORGANISMS   COMMENT                   RESULTS SUGGESTIVE OF CONTAMINATION   COMMENT                   SUGGEST RECOLLECTION   ANTIBIOTIC                                                        RADIOLOGY:  No results found.  EKG:   Orders placed or performed during the hospital encounter of 06/15/14  . ED EKG  . ED EKG  . EKG 12-Lead  . EKG 12-Lead      Management plans discussed with the patient, family and they are in agreement.  CODE STATUS:     Code Status Orders        Start     Ordered   06/15/14 1402  Full code   Continuous     06/15/14 1402      TOTAL TIME TAKING CARE OF THIS PATIENT: 35 minutes.    Myrtis Ser M.D on 06/22/2014 at 3:35 PM  Between 7am to 6pm - Pager - [phone]  After 6pm go to [email or web address] - password [email or web address] [facility]  [facility]  Office  [phone]  CC: Primary care physician; No PCP Per Patient

## 2014-06-22 NOTE — Progress Notes (Signed)
Charge nurse d/c pt. Removed. IV. Educated and answered questions.

## 2014-06-23 LAB — SURGICAL PATHOLOGY

## 2014-06-24 ENCOUNTER — Encounter: Payer: Self-pay | Admitting: Gastroenterology

## 2014-08-09 ENCOUNTER — Encounter: Payer: Self-pay | Admitting: Emergency Medicine

## 2014-08-09 ENCOUNTER — Inpatient Hospital Stay
Admission: EM | Admit: 2014-08-09 | Discharge: 2014-08-13 | DRG: 392 | Disposition: A | Discharge status: Home / Self Care | Payer: Self-pay | Attending: Internal Medicine | Admitting: Internal Medicine

## 2014-08-09 DIAGNOSIS — T471X6A Underdosing of other antacids and anti-gastric-secretion drugs, initial encounter: Secondary | ICD-10-CM | POA: Diagnosis present

## 2014-08-09 DIAGNOSIS — K2980 Duodenitis without bleeding: Principal | ICD-10-CM | POA: Diagnosis present

## 2014-08-09 DIAGNOSIS — K922 Gastrointestinal hemorrhage, unspecified: Secondary | ICD-10-CM

## 2014-08-09 DIAGNOSIS — E440 Moderate protein-calorie malnutrition: Secondary | ICD-10-CM | POA: Diagnosis present

## 2014-08-09 DIAGNOSIS — F1290 Cannabis use, unspecified, uncomplicated: Secondary | ICD-10-CM | POA: Diagnosis present

## 2014-08-09 DIAGNOSIS — Z880 Allergy status to penicillin: Secondary | ICD-10-CM

## 2014-08-09 DIAGNOSIS — Z8711 Personal history of peptic ulcer disease: Secondary | ICD-10-CM

## 2014-08-09 DIAGNOSIS — E876 Hypokalemia: Secondary | ICD-10-CM | POA: Diagnosis present

## 2014-08-09 DIAGNOSIS — K210 Gastro-esophageal reflux disease with esophagitis: Secondary | ICD-10-CM | POA: Diagnosis present

## 2014-08-09 DIAGNOSIS — Z91120 Patient's intentional underdosing of medication regimen due to financial hardship: Secondary | ICD-10-CM | POA: Diagnosis present

## 2014-08-09 DIAGNOSIS — G43A0 Cyclical vomiting, not intractable: Secondary | ICD-10-CM | POA: Diagnosis present

## 2014-08-09 DIAGNOSIS — F17210 Nicotine dependence, cigarettes, uncomplicated: Secondary | ICD-10-CM | POA: Diagnosis present

## 2014-08-09 DIAGNOSIS — Z79899 Other long term (current) drug therapy: Secondary | ICD-10-CM

## 2014-08-09 DIAGNOSIS — K920 Hematemesis: Secondary | ICD-10-CM | POA: Diagnosis present

## 2014-08-09 HISTORY — DX: Cyclical vomiting syndrome unrelated to migraine: R11.15

## 2014-08-09 LAB — COMPREHENSIVE METABOLIC PANEL
ALBUMIN: 4.9 g/dL (ref 3.5–5.0)
ALK PHOS: 53 U/L (ref 38–126)
ALT: 16 U/L — ABNORMAL LOW (ref 17–63)
ANION GAP: 16 — AB (ref 5–15)
AST: 28 U/L (ref 15–41)
BILIRUBIN TOTAL: 1.2 mg/dL (ref 0.3–1.2)
BUN: 10 mg/dL (ref 6–20)
CALCIUM: 9.8 mg/dL (ref 8.9–10.3)
CHLORIDE: 96 mmol/L — AB (ref 101–111)
CO2: 27 mmol/L (ref 22–32)
Creatinine, Ser: 0.95 mg/dL (ref 0.61–1.24)
GFR calc Af Amer: >60 mL/min (ref >60)
GLUCOSE: 180 mg/dL — AB (ref 65–99)
Potassium: 3.7 mmol/L (ref 3.5–5.1)
Sodium: 139 mmol/L (ref 135–145)
Total Protein: 7.8 g/dL (ref 6.5–8.1)

## 2014-08-09 LAB — CBC
HCT: 49.6 % (ref 40.0–52.0)
HEMOGLOBIN: 16.8 g/dL (ref 13.0–18.0)
MCH: 31.7 pg (ref 26.0–34.0)
MCHC: 33.9 g/dL (ref 32.0–36.0)
MCV: 93.6 fL (ref 80.0–100.0)
Platelets: 320 10*3/uL (ref 150–440)
RBC: 5.3 MIL/uL (ref 4.40–5.90)
RDW: 13.9 % (ref 11.5–14.5)
WBC: 15.8 10*3/uL — ABNORMAL HIGH (ref 3.8–10.6)

## 2014-08-09 LAB — URINALYSIS COMPLETE WITH MICROSCOPIC (ARMC ONLY)
BILIRUBIN URINE: NEGATIVE
Glucose, UA: >500 mg/dL — AB
HGB URINE DIPSTICK: NEGATIVE
Leukocytes, UA: NEGATIVE
Nitrite: NEGATIVE
Protein, ur: 100 mg/dL — AB
SPECIFIC GRAVITY, URINE: 1.022 (ref 1.005–1.030)
Squamous Epithelial / LPF: NONE SEEN
pH: 9 — ABNORMAL HIGH (ref 5.0–8.0)

## 2014-08-09 LAB — LIPASE, BLOOD: Lipase: 20 U/L — ABNORMAL LOW (ref 22–51)

## 2014-08-09 MED ORDER — PANTOPRAZOLE SODIUM 40 MG IV SOLR
40.0000 mg | Freq: Two times a day (BID) | INTRAVENOUS | Status: DC
  Administered 2014-08-10 – 2014-08-12 (×7): 40 mg via INTRAVENOUS
  Filled 2014-08-09 (×7): qty 40

## 2014-08-09 MED ORDER — ACETAMINOPHEN 650 MG RE SUPP: 650.0000 mg | Freq: Four times a day (QID) | RECTAL | Status: DC | PRN

## 2014-08-09 MED ORDER — ONDANSETRON HCL 4 MG PO TABS: 4.0000 mg | ORAL_TABLET | Freq: Four times a day (QID) | ORAL | Status: DC | PRN

## 2014-08-09 MED ORDER — PROMETHAZINE HCL 25 MG/ML IJ SOLN
25.0000 mg | Freq: Once | INTRAMUSCULAR | Status: AC
  Administered 2014-08-09: 25 mg via INTRAVENOUS
  Filled 2014-08-09: qty 1

## 2014-08-09 MED ORDER — SODIUM CHLORIDE 0.9 % IV BOLUS (SEPSIS)
1000.0000 mL | Freq: Once | INTRAVENOUS | Status: AC
  Administered 2014-08-09: 1000 mL via INTRAVENOUS

## 2014-08-09 MED ORDER — SODIUM CHLORIDE 0.9 % IV SOLN
8.0000 mg | Freq: Once | INTRAVENOUS | Status: AC
  Administered 2014-08-09: 8 mg via INTRAVENOUS
  Filled 2014-08-09: qty 4

## 2014-08-09 MED ORDER — DEXTROSE 5 % AND 0.9 % NACL IV BOLUS
1000.0000 mL | Freq: Once | INTRAVENOUS | Status: AC
  Administered 2014-08-09: 1000 mL via INTRAVENOUS

## 2014-08-09 MED ORDER — ONDANSETRON HCL 4 MG/2ML IJ SOLN
4.0000 mg | Freq: Once | INTRAMUSCULAR | Status: AC | PRN
  Filled 2014-08-09: qty 2

## 2014-08-09 MED ORDER — DIPHENHYDRAMINE HCL 50 MG/ML IJ SOLN
50.0000 mg | Freq: Once | INTRAMUSCULAR | Status: AC
  Administered 2014-08-09: 50 mg via INTRAVENOUS
  Filled 2014-08-09: qty 1

## 2014-08-09 MED ORDER — NICOTINE 21 MG/24HR TD PT24
21.0000 mg | MEDICATED_PATCH | Freq: Every day | TRANSDERMAL | Status: DC
  Administered 2014-08-12 – 2014-08-13 (×2): 21 mg via TRANSDERMAL
  Filled 2014-08-09 (×3): qty 1

## 2014-08-09 MED ORDER — KCL IN DEXTROSE-NACL 20-5-0.9 MEQ/L-%-% IV SOLN
INTRAVENOUS | Status: AC
  Administered 2014-08-10 (×2): via INTRAVENOUS
  Filled 2014-08-09 (×2): qty 1000

## 2014-08-09 MED ORDER — MORPHINE SULFATE 2 MG/ML IJ SOLN
2.0000 mg | INTRAMUSCULAR | Status: DC | PRN
  Administered 2014-08-10 – 2014-08-13 (×9): 2 mg via INTRAVENOUS
  Filled 2014-08-09 (×9): qty 1

## 2014-08-09 MED ORDER — METOCLOPRAMIDE HCL 5 MG/ML IJ SOLN
20.0000 mg | Freq: Once | INTRAVENOUS | Status: AC
  Administered 2014-08-09: 20 mg via INTRAVENOUS
  Filled 2014-08-09: qty 4

## 2014-08-09 MED ORDER — ONDANSETRON HCL 4 MG/2ML IJ SOLN
4.0000 mg | Freq: Four times a day (QID) | INTRAMUSCULAR | Status: DC | PRN
  Administered 2014-08-10 – 2014-08-13 (×9): 4 mg via INTRAVENOUS
  Filled 2014-08-09 (×9): qty 2

## 2014-08-09 MED ORDER — PANTOPRAZOLE SODIUM 40 MG IV SOLR
40.0000 mg | Freq: Once | INTRAVENOUS | Status: AC
  Administered 2014-08-09: 40 mg via INTRAVENOUS
  Filled 2014-08-09: qty 40

## 2014-08-09 MED ORDER — ACETAMINOPHEN 325 MG PO TABS: 650.0000 mg | ORAL_TABLET | Freq: Four times a day (QID) | ORAL | Status: DC | PRN

## 2014-08-09 NOTE — ED Provider Notes (Addendum)
[facility] Emergency Department Provider Note  ____________________________________________  Time seen: 7:40 PM  I have reviewed the triage vital signs and the nursing notes.   HISTORY  Chief Complaint Hematemesis and Emesis    HPI Dakota Bowen is a 34 y.o. male will this morning at 3 AM with nausea vomiting and diffuse abdominal pain. He denies any diarrhea and has not had a bowel movement for the past 2 days. He states he's had this before as he has severe gastritis and history of ulcers. He takes protonix home for this. However he's been unable to tolerate any oral intake all day and has had multiple episodes of vomiting. Most recently, he is vomited what looks like coffee grounds. Denies chest pain shortness of breath dizziness or syncope. No fevers or chills. He's previously been followed by GI Dr. Gustavo Lah.     Past Medical History  Diagnosis Date  . GERD (gastroesophageal reflux disease)   . Cyclical vomiting syndrome     Patient Active Problem List   Diagnosis Date Noted  . Duodenitis 06/22/2014  . Gastritis 06/17/2014  . Vomiting 06/15/2014    Past Surgical History  Procedure Laterality Date  . Esophagogastroduodenoscopy (egd) with propofol N/A 06/21/2014    Procedure: ESOPHAGOGASTRODUODENOSCOPY (EGD) WITH PROPOFOL. Looking in the esophagus stomach and upper small intestine with a lighted tube to examine and treat.;  Surgeon: Lollie Sails, MD;  Location: [facility] ENDOSCOPY;  Service: Endoscopy;  Laterality: N/A;   cholecystectomy  Current Outpatient Rx  Name  Route  Sig  Dispense  Refill  . ondansetron (ZOFRAN ODT) 4 MG disintegrating tablet   Oral   Take 1 tablet (4 mg total) by mouth every 8 (eight) hours as needed for nausea or vomiting.   20 tablet   0   . pantoprazole (PROTONIX) 40 MG tablet   Oral   Take 1 tablet (40 mg total) by mouth 2 (two) times daily.   60 tablet   0   . promethazine (PHENERGAN) 12.5 MG tablet    Oral   Take 1 tablet (12.5 mg total) by mouth every 6 (six) hours as needed for nausea or vomiting.   20 tablet   0     Allergies Penicillins  Family History  Problem Relation Age of Onset  . Diabetes Mother   . Diabetes Father     Social History History  Substance Use Topics  . Smoking status: Current Every Day Smoker -- 1.50 packs/day  . Smokeless tobacco: Not on file  . Alcohol Use: Yes     Comment: 6-7 beers a week    Review of Systems  Constitutional: No fever or chills. No weight changes Eyes:No blurry vision or double vision.  ENT: No sore throat. Cardiovascular: No chest pain. Respiratory: No dyspnea or cough. Gastrointestinal: Positive generalized abdominal pain with vomiting. No diarrhea.  No BRBPR or melena. Genitourinary: Negative for dysuria, urinary retention, bloody urine, or difficulty urinating. Musculoskeletal: Negative for back pain. No joint swelling or pain. Skin: Negative for rash. Neurological: Negative for headaches, focal weakness or numbness. Psychiatric:No anxiety or depression.   Endocrine:No hot/cold intolerance, changes in energy, or sleep difficulty.  10-point ROS otherwise negative.  ____________________________________________   PHYSICAL EXAM:  VITAL SIGNS: ED Triage Vitals  Enc Vitals Group     BP 08/09/14 1729 127/84 mmHg     Pulse Rate 08/09/14 1729 60     Resp 08/09/14 1729 18     Temp 08/09/14 1729 98.3 F (  36.8 C)     Temp Source 08/09/14 1729 Oral     SpO2 08/09/14 1729 100 %     Weight 08/09/14 1729 145 lb (65.772 kg)     Height 08/09/14 1729 6' (1.829 m)     Head Cir --      Peak Flow --      Pain Score 08/09/14 1730 7     Pain Loc --      Pain Edu? --      Excl. in [place]? --      Constitutional: Alert and oriented. Moderate distress, vomiting. Eyes: No scleral icterus. No conjunctival pallor. PERRL. EOMI ENT   Head: Normocephalic and atraumatic.   Nose: No congestion/rhinnorhea. No septal  hematoma   Mouth/Throat: Dry mucous membranes, mild pharyngeal erythema. No peritonsillar mass. No uvula shift.   Neck: No stridor. No SubQ emphysema. No meningismus. Hematological/Lymphatic/Immunilogical: No cervical lymphadenopathy. Cardiovascular: RRR. Normal and symmetric distal pulses are present in all extremities. No murmurs, rubs, or gallops. Respiratory: Normal respiratory effort without tachypnea nor retractions. Breath sounds are clear and equal bilaterally. No wheezes/rales/rhonchi. Gastrointestinal: Bilateral upper quadrant abdominal tenderness. No distention. There is no CVA tenderness.  No rebound, rigidity, or guarding. Rectal exam shows no fissures or hemorrhoids. There is thin brown stool which is strongly Hemoccult positive Genitourinary: deferred Musculoskeletal: Nontender with normal range of motion in all extremities. No joint effusions.  No lower extremity tenderness.  No edema. Neurologic:   Normal speech and language.  CN 2-10 normal. Motor grossly intact. No pronator drift.  Normal gait. No gross focal neurologic deficits are appreciated.  Skin:  Skin is warm, dry and intact. No rash noted.  No petechiae, purpura, or bullae. Psychiatric: Mood and affect are normal. Speech and behavior are normal. Patient exhibits appropriate insight and judgment.  ____________________________________________    LABS (pertinent positives/negatives) (all labs ordered are listed, but only abnormal results are displayed) Labs Reviewed  LIPASE, BLOOD - Abnormal; Notable for the following:    Lipase 20 (*)    All other components within normal limits  COMPREHENSIVE METABOLIC PANEL - Abnormal; Notable for the following:    Chloride 96 (*)    Glucose, Bld 180 (*)    ALT 16 (*)    Anion gap 16 (*)    All other components within normal limits  CBC - Abnormal; Notable for the following:    WBC 15.8 (*)    All other components within normal limits  URINALYSIS COMPLETEWITH  MICROSCOPIC ([facility] ONLY)   ____________________________________________   EKG    ____________________________________________    RADIOLOGY    ____________________________________________   PROCEDURES  ____________________________________________   INITIAL IMPRESSION / ASSESSMENT AND PLAN / ED COURSE  Pertinent labs & imaging results that were available during my care of the patient were reviewed by me and considered in my medical decision making (see chart for details).  Patient presents with recurrence of his cyclical vomiting with oral intolerance which is likely related to his history of gastritis duodenitis and ulcers. We'll give him antiemetics and IV fluid rehydration while checking labs. With the severity of the symptoms and his ongoing upper GI bleed, we'll give him IV Protonix on an intermittent bolus basis and plan to admit for further management.  ----------------------------------------- 8:44 PM on 08/09/2014 -----------------------------------------  Labs reveal hemoconcentration and a hypochloremic gap acidosis. No evidence of biliary pathology or pancreatitis on labs. With prior cholecystectomy, abdominal imaging is of low utility at this time.  ____________________________________________  FINAL CLINICAL IMPRESSION(S) / ED DIAGNOSES  Final diagnoses:  Hematemesis with nausea  Acute upper GI bleed      Carrie Mew, MD 08/09/14 0347  Carrie Mew, MD 08/09/14 2044

## 2014-08-10 ENCOUNTER — Other Ambulatory Visit: Payer: Self-pay | Admitting: Internal Medicine

## 2014-08-10 DIAGNOSIS — K920 Hematemesis: Secondary | ICD-10-CM

## 2014-08-10 DIAGNOSIS — R110 Nausea: Secondary | ICD-10-CM

## 2014-08-10 LAB — CBC
HCT: 45.2 % (ref 40.0–52.0)
Hemoglobin: 14.9 g/dL (ref 13.0–18.0)
MCH: 31.4 pg (ref 26.0–34.0)
MCHC: 33 g/dL (ref 32.0–36.0)
MCV: 95.3 fL (ref 80.0–100.0)
PLATELETS: 267 10*3/uL (ref 150–440)
RBC: 4.74 MIL/uL (ref 4.40–5.90)
RDW: 13.9 % (ref 11.5–14.5)
WBC: 15.7 10*3/uL — AB (ref 3.8–10.6)

## 2014-08-10 LAB — MAGNESIUM: MAGNESIUM: 1.7 mg/dL (ref 1.7–2.4)

## 2014-08-10 LAB — COMPREHENSIVE METABOLIC PANEL
ALBUMIN: 4 g/dL (ref 3.5–5.0)
ALT: 12 U/L — ABNORMAL LOW (ref 17–63)
ANION GAP: 9 (ref 5–15)
AST: 16 U/L (ref 15–41)
Alkaline Phosphatase: 44 U/L (ref 38–126)
BUN: 9 mg/dL (ref 6–20)
CO2: 25 mmol/L (ref 22–32)
Calcium: 8.3 mg/dL — ABNORMAL LOW (ref 8.9–10.3)
Chloride: 106 mmol/L (ref 101–111)
Creatinine, Ser: 0.77 mg/dL (ref 0.61–1.24)
GFR calc Af Amer: >60 mL/min (ref >60)
Glucose, Bld: 136 mg/dL — ABNORMAL HIGH (ref 65–99)
Potassium: 3 mmol/L — ABNORMAL LOW (ref 3.5–5.1)
Sodium: 140 mmol/L (ref 135–145)
TOTAL PROTEIN: 6.4 g/dL — AB (ref 6.5–8.1)
Total Bilirubin: 0.8 mg/dL (ref 0.3–1.2)

## 2014-08-10 LAB — HEMOGLOBIN AND HEMATOCRIT, BLOOD
HCT: 45.3 % (ref 40.0–52.0)
HEMATOCRIT: 43.5 % (ref 40.0–52.0)
Hemoglobin: 15.1 g/dL (ref 13.0–18.0)
Hemoglobin: 14.8 g/dL (ref 13.0–18.0)

## 2014-08-10 LAB — TYPE AND SCREEN
ABO/RH(D): A POS
Antibody Screen: NEGATIVE

## 2014-08-10 MED ORDER — PROMETHAZINE HCL 25 MG/ML IJ SOLN
12.5000 mg | Freq: Four times a day (QID) | INTRAMUSCULAR | Status: DC | PRN
  Administered 2014-08-10 – 2014-08-13 (×8): 12.5 mg via INTRAVENOUS
  Filled 2014-08-10 (×9): qty 1

## 2014-08-10 MED ORDER — KCL IN DEXTROSE-NACL 20-5-0.9 MEQ/L-%-% IV SOLN
INTRAVENOUS | Status: AC
Start: 2014-08-10 — End: 2014-08-11
  Administered 2014-08-10 – 2014-08-11 (×2): via INTRAVENOUS
  Filled 2014-08-10 (×2): qty 1000

## 2014-08-10 MED ORDER — CETYLPYRIDINIUM CHLORIDE 0.05 % MT LIQD
7.0000 mL | Freq: Two times a day (BID) | OROMUCOSAL | Status: DC
  Administered 2014-08-10 – 2014-08-13 (×3): 7 mL via OROMUCOSAL

## 2014-08-10 MED ORDER — PNEUMOCOCCAL VAC POLYVALENT 25 MCG/0.5ML IJ INJ: 0.5000 mL | INJECTION | INTRAMUSCULAR | Status: DC

## 2014-08-10 MED ORDER — POTASSIUM CHLORIDE 20 MEQ/15ML (10%) PO SOLN
40.0000 meq | Freq: Once | ORAL | Status: DC
  Filled 2014-08-10: qty 30

## 2014-08-10 MED ORDER — CHLORHEXIDINE GLUCONATE 0.12 % MT SOLN
15.0000 mL | Freq: Two times a day (BID) | OROMUCOSAL | Status: DC
  Administered 2014-08-11 – 2014-08-13 (×3): 15 mL via OROMUCOSAL

## 2014-08-10 NOTE — H&P (Signed)
[facility] [place] NAME: Dakota Bowen    MR#:  [id number]  DATE OF BIRTH:  May 14, 1980  DATE OF ADMISSION:  08/09/2014  PRIMARY CARE PHYSICIAN: No PCP Per Patient   REQUESTING/REFERRING PHYSICIAN: Dr. Joni Fears  CHIEF COMPLAINT:  Nausea, hematemesis  HISTORY OF PRESENT ILLNESS:  Dakota Bowen  is a 34 y.o. male with a known history of cyclic vomiting syndrome, recent history of EGD by Dr. Gustavo Lah, diagnosed with duodenitis is presenting to the ED with a chief complaint of intractable nausea and vomiting started at 3 AM. He was reporting diffuse abdominal pain from retching and vomiting. He states he has had severe history of gastritis in the past with history of gastric ulcers. Patient was unable to tolerate by mouth intake and eventually started having coffee-ground vomit. Denies any chest pain, dizziness or loss of consciousness. Denies any fever. Denies any sick contacts. His stool was positive for occult blood in the ED  PAST MEDICAL HISTORY:   Past Medical History  Diagnosis Date  . GERD (gastroesophageal reflux disease)   . Cyclical vomiting syndrome     PAST SURGICAL HISTOIRY:   Past Surgical History  Procedure Laterality Date  . Esophagogastroduodenoscopy (egd) with propofol N/A 06/21/2014    Procedure: ESOPHAGOGASTRODUODENOSCOPY (EGD) WITH PROPOFOL. Looking in the esophagus stomach and upper small intestine with a lighted tube to examine and treat.;  Surgeon: Lollie Sails, MD;  Location: [facility] ENDOSCOPY;  Service: Endoscopy;  Laterality: N/A;    SOCIAL HISTORY:   History  Substance Use Topics  . Smoking status: Current Every Day Smoker -- 1.50 packs/day  . Smokeless tobacco: Not on file  . Alcohol Use: Yes     Comment: 6-7 beers a week    FAMILY HISTORY:   Family History  Problem Relation Age of Onset  . Diabetes Mother   . Diabetes Father     DRUG ALLERGIES:   Allergies  Allergen Reactions  .  Penicillins Itching and Rash    REVIEW OF SYSTEMS:  CONSTITUTIONAL: No fever, fatigue , reporting weakness.  EYES: No blurred or double vision.  EARS, NOSE, AND THROAT: No tinnitus or ear pain.  RESPIRATORY: No cough, shortness of breath, wheezing or hemoptysis.  CARDIOVASCULAR: No chest pain, orthopnea, edema.  GASTROINTESTINAL: Has nausea, vomiting, diarrhea and abdominal pain. Also reporting coffee-ground emesis GENITOURINARY: No dysuria, hematuria.  ENDOCRINE: No polyuria, nocturia,  HEMATOLOGY: No anemia, easy bruising or bleeding SKIN: No rash or lesion. MUSCULOSKELETAL: No joint pain or arthritis.   NEUROLOGIC: No tingling, numbness, weakness.  PSYCHIATRY: No anxiety or depression.   MEDICATIONS AT HOME:   Prior to Admission medications   Medication Sig Start Date End Date Taking? Authorizing Provider  ondansetron (ZOFRAN ODT) 4 MG disintegrating tablet Take 1 tablet (4 mg total) by mouth every 8 (eight) hours as needed for nausea or vomiting. Patient not taking: Reported on 08/09/2014 06/22/14   Aldean Jewett, MD  pantoprazole (PROTONIX) 40 MG tablet Take 1 tablet (40 mg total) by mouth 2 (two) times daily. Patient not taking: Reported on 08/09/2014 06/22/14   Aldean Jewett, MD  promethazine (PHENERGAN) 12.5 MG tablet Take 1 tablet (12.5 mg total) by mouth every 6 (six) hours as needed for nausea or vomiting. Patient not taking: Reported on 08/09/2014 06/15/14   Lavonia Drafts, MD      VITAL SIGNS:  Blood pressure 129/90, pulse 105, temperature 98.6 F (37 C), temperature source Oral, resp. rate 16,  height 6' (1.829 m), weight 63.73 kg (140 lb 8 oz), SpO2 100 %.  PHYSICAL EXAMINATION:  GENERAL:  34 y.o.-year-old patient lying in the bed with no acute distress.  EYES: Pupils equal, round, reactive to light and accommodation. No scleral icterus. Extraocular muscles intact.  HEENT: Head atraumatic, normocephalic. Oropharynx and nasopharynx clear.  NECK:  Supple, no jugular  venous distention. No thyroid enlargement, no tenderness.  LUNGS: Normal breath sounds bilaterally, no wheezing, rales,rhonchi or crepitation. No use of accessory muscles of respiration.  CARDIOVASCULAR: S1, S2 normal. No murmurs, rubs, or gallops.  ABDOMEN: Soft, has generalized abdominal tenderness but no rebound tenderness, nondistended. Bowel sounds present. No organomegaly or mass.  EXTREMITIES: No pedal edema, cyanosis, or clubbing.  NEUROLOGIC: Cranial nerves II through XII are intact. Muscle strength 5/5 in all extremities. Sensation intact. Gait not checked.  PSYCHIATRIC: The patient is alert and oriented x 3.  SKIN: No obvious rash, lesion, or ulcer.   LABORATORY PANEL:   CBC  Recent Labs Lab 08/09/14 1957 08/10/14 0102  WBC 15.8*  --   HGB 16.8 15.1  HCT 49.6 45.3  PLT 320  --    ------------------------------------------------------------------------------------------------------------------  Chemistries   Recent Labs Lab 08/09/14 1957  NA 139  K 3.7  CL 96*  CO2 27  GLUCOSE 180*  BUN 10  CREATININE 0.95  CALCIUM 9.8  AST 28  ALT 16*  ALKPHOS 53  BILITOT 1.2   ------------------------------------------------------------------------------------------------------------------  Cardiac Enzymes No results for input(s): TROPONINI in the last 168 hours. ------------------------------------------------------------------------------------------------------------------  RADIOLOGY:  No results found.  EKG:   Orders placed or performed during the hospital encounter of 06/15/14  . ED EKG  . ED EKG  . EKG 12-Lead  . EKG 12-Lead  . EKG    IMPRESSION AND PLAN:  Dakota Bowen  is a 34 y.o. male with a known history of cyclic vomiting syndrome, recent history of EGD by Dr. Gustavo Lah, diagnosed with duodenitis is presenting to the ED with a chief complaint of intractable nausea and vomiting started at 3 AM. He was reporting diffuse abdominal pain from retching  and vomiting. He states he has had severe history of gastritis in the past with history of gastric ulcers. Patient was unable to tolerate by mouth intake and eventually started having coffee-ground vomit  1. Intractable nausea and vomiting with hematemesis and positive stool for occult blood  Admit to MedSurg unit Monitor hemoglobin and hematocrit closely every 6 hours Will provide blood transition when necessary Type and screen the blood GI prophylaxis with Protonix GI consult is placed Will provide hydration with IV fluids while patient is nothing by mouth Will repeat stool for occult blood, currently patient is denying diarrhea  2. History of cyclic vomiting syndrome  Provide antiemetics GI prophylaxis with Protonix IV fluids GI consult is placed  3. History of gastric ulcers and duodenitis  GI consult is placed for possible repeat EGD  4. Nicotine dependence   counseled patient to quit smoking for 3-5 minutes. He is interested to continue nicotine patch. Will provide nicotine patch   GI prophylaxis with Protonix DVT prophylaxis with SCDs      All the records are reviewed and case discussed with ED provider. Management plans discussed with the patient, family and they are in agreement.  CODE STATUS: Full code, mom is the healthcare power of attorney  TOTAL TIME TAKING CARE OF THIS PATIENT: Reviewing medical records, history and physical, admission orders and correlation of care -45 minutes.  Nicholes Mango M.D on 08/10/2014 at 1:44 AM  Between 7am to 6pm - Pager - [phone]  After 6pm go to [email or web address] - password [email or web address] [facility]  [facility]  Office  [phone]  CC: Primary care physician; No PCP Per Patient

## 2014-08-10 NOTE — Progress Notes (Signed)
Pt continues to have nausea. Not time for zofran. Requesting for alternative.  Dr. Margaretmary Eddy will place order.

## 2014-08-10 NOTE — Progress Notes (Signed)
Pt admitted to [facility] from the ED. Alert and oriented. VSS.  Continues to c/o nausea and pain.  Zofran and phenergan given as ordered. Morphine given as ordered. Voiding without difficulty. No BM noted in this shift.  No hematemesis noted.  Resting quietly throughout shift.Will cont to monitor.

## 2014-08-10 NOTE — Progress Notes (Signed)
[name] NAME: Dakota Bowen    MR#:  [id number]  DATE OF BIRTH:  04-15-1980  SUBJECTIVE:  Patient feels anxious that he may have vomiting. REVIEW OF SYSTEMS:    Review of Systems  Constitutional: Negative for fever, chills and malaise/fatigue.  HENT: Negative for congestion, hearing loss and sore throat.   Eyes: Negative for blurred vision.  Respiratory: Negative for cough, hemoptysis, shortness of breath and wheezing.   Cardiovascular: Negative for chest pain, palpitations and leg swelling.  Gastrointestinal: Positive for heartburn, nausea, vomiting, abdominal pain and melena. Negative for diarrhea and blood in stool.  Genitourinary: Negative for dysuria, urgency, frequency, hematuria and flank pain.  Musculoskeletal: Negative for back pain.  Neurological: Negative for dizziness, tremors and headaches.  Endo/Heme/Allergies: Does not bruise/bleed easily.    Tolerating Diet:NO      DRUG ALLERGIES:   Allergies  Allergen Reactions  . Penicillins Itching and Rash    VITALS:  Blood pressure 111/68, pulse 87, temperature 98.2 F (36.8 C), temperature source Oral, resp. rate 18, height 6' (1.829 m), weight 63.73 kg (140 lb 8 oz), SpO2 97 %.  PHYSICAL EXAMINATION:   Physical Exam  Constitutional: He is oriented to person, place, and time and well-developed, well-nourished, and in no distress. No distress.  HENT:  Head: Normocephalic.  Eyes: No scleral icterus.  Neck: Normal range of motion. Neck supple. No JVD present. No tracheal deviation present.  Cardiovascular: Normal rate, regular rhythm and normal heart sounds.  Exam reveals no gallop and no friction rub.   No murmur heard. Pulmonary/Chest: Effort normal and breath sounds normal. No respiratory distress. He has no wheezes. He has no rales. He exhibits no tenderness.  Abdominal: Soft. Bowel sounds are normal. He exhibits no distension and no mass. There  is tenderness. There is no rebound and no guarding.  Musculoskeletal: Normal range of motion. He exhibits no edema.  Neurological: He is alert and oriented to person, place, and time.  Skin: Skin is warm. No rash noted. No erythema.  Psychiatric: Affect and judgment normal.      LABORATORY PANEL:   CBC  Recent Labs Lab 08/10/14 0505  WBC 15.7*  HGB 14.9  HCT 45.2  PLT 267   ------------------------------------------------------------------------------------------------------------------  Chemistries   Recent Labs Lab 08/10/14 0505  NA 140  K 3.0*  CL 106  CO2 25  GLUCOSE 136*  BUN 9  CREATININE 0.77  CALCIUM 8.3*  MG 1.7  AST 16  ALT 12*  ALKPHOS 44  BILITOT 0.8   ------------------------------------------------------------------------------------------------------------------  Cardiac Enzymes No results for input(s): TROPONINI in the last 168 hours. ------------------------------------------------------------------------------------------------------------------  RADIOLOGY:  No results found.   ASSESSMENT AND PLAN:   34 year old male with history of cyclic vomiting syndrome who presented with 3 days of nausea and vomiting along with abdominal pain.  1. Intractable nausea and vomiting with hematemesis and positive stool for occult blood: Patient does have a history of cyclic vomiting syndrome. Patient will continue on supportive care. Hemoglobin remains relatively stable. GI consultation has been placed. Continue PPI. Continue IV fluids. Patient is currently denying hematochezia or melanoma. He has had a recent EGD showing duodenitis.  2. History of gastric ulcer and duodenitis: Continue PPI. Further recommendations as per GI.  3. Tobacco dependence: Patient encouraged to stop smoking. Nicotine patch has been ordered. 4. Hypokalemia: Potassium has been repleted. I will repeat a BMP in a.m.   Management plans discussed with the patient  and he is in  agreement.  CODE STATUS: FULL  TOTAL TIME TAKING CARE OF THIS PATIENT: 33 minutes.   Greater than 50% counseling and coordination of care  POSSIBLE D/C  2 days, DEPENDING ON CLINICAL CONDITION.   Dakota Bowen M.D on 08/10/2014 at 11:44 AM  Between 7am to 6pm - Pager - [phone] After 6pm go to [email or web address] - password [email or web address] [facility]  [place] [facility]  Office  [phone]  CC: Primary care physician; No PCP Per Patient

## 2014-08-10 NOTE — Progress Notes (Signed)
Initial Nutrition Assessment  DOCUMENTATION CODES:      INTERVENTION:   Meals and snacks: cater to pt preferences once diet progressed  NUTRITION DIAGNOSIS:   Inadequate oral intake related to altered GI function as evidenced by NPO status.    GOAL:   Patient will meet greater than or equal to 90% of their needs    MONITOR:    (Energy intake, Gastrointestinal profile)  REASON FOR ASSESSMENT:   Malnutrition Screening Tool    ASSESSMENT:   Pt admitted with nausea, vomiting, duodenitis  Past Medical History  Diagnosis Date  . GERD (gastroesophageal reflux disease)   . Cyclical vomiting syndrome     Current Nutrition: NPO  Food/Nutrition-Related History: pt reports for the past 2 days prior to admission poor po intake secondary to nausea, vomiting.  Pt reports eating 2 meals per day typically prior to admission.     Medications: D5 NS with KCL at 120ml/hr, reglan, KCL  Electrolyte/Renal Profile and Glucose Profile:   Recent Labs Lab 08/09/14 1957 08/10/14 0505  NA 139 140  K 3.7 3.0*  CL 96* 106  CO2 27 25  BUN 10 9  CREATININE 0.95 0.77  CALCIUM 9.8 8.3*  MG  --  1.7  GLUCOSE 180* 136*   Protein Profile:  Recent Labs Lab 08/09/14 1957 08/10/14 0505  ALBUMIN 4.9 4.0    Gastrointestinal Profile: Last BM:7/24   Nutrition-Focused Physical Exam Findings: Nutrition-Focused physical exam completed. Findings are normal to  mild fat depletion, normal -mild muscle depletion, and no edema.      Weight Change: Pt reports UBW of 145-150 pounds. Reports often during episodes of nausea, vomiting will loose 5-10 pounds then gain it back when eating better    Diet Order:  Diet clear liquid Room service appropriate?: Yes; Fluid consistency:: Thin  Skin:  Reviewed, no issues   Height:   Ht Readings from Last 1 Encounters:  08/09/14 6' (1.829 m)    Weight:   Wt Readings from Last 1 Encounters:  08/09/14 140 lb 8 oz (63.73 kg)    Ideal  Body Weight:     Wt Readings from Last 10 Encounters:  08/09/14 140 lb 8 oz (63.73 kg)  06/22/14 139 lb 14.4 oz (63.458 kg)    BMI:  Body mass index is 19.05 kg/(m^2).  Estimated Nutritional Needs:   Kcal:  Using IBW of 81kg (BEE 1778 kcals (IF 1.0-1.2, AF 1.3) 2311-2773 kcals/d  Protein:  Using IBW of 81kg (1.0-1.2 g/d)81-97 g/d  Fluid:  Using IBW of 81 kg (30-24ml/kg) 2430-2818ml/d  EDUCATION NEEDS:   No education needs identified at this time  [place]. Zenia Resides, [place], [place] (pager)

## 2014-08-10 NOTE — Consult Note (Signed)
[facility]  [address], [place], [place] [address] Phone: [phone] Fax : [phone]  Consultation  Referring Provider:     No ref. provider found Primary Care Physician:  No PCP Per Patient Primary Gastroenterologist:  None         Reason for Consultation:     Nausea vomiting with hematemesis  Date of Admission:  08/09/2014 Date of Consultation:  08/10/2014         HPI:   Dakota Bowen is a 34 y.o. male who comes in with recurrent nausea vomiting. The patient was seen with the same symptoms back in June 6 of this year. The patient had an upper endoscopy by Dr. Donnella Sham and was found to have a normal duodenum with chronic inflammation of the esophagus and normal stomach biopsies. The patient reports that he has probably 2 of these episodes of nausea and vomiting for a year. He states that he has heartburn all the time and was recently off of his Protonix because it was too expensive. He is on Protonix and Zantac regularly when he takes his medications. The patient has had some fluctuation of his weight. The patient has also been seen in the past by Dr. Candace Cruise and Dr. Reece Leader for the same. The patient's hemoglobin has remained normal and he also reports that he has not had any further vomiting since 4:00 in the morning yesterday. The patient has a history of smoking marijuana twice a day which she states helps his nausea.  Past Medical History  Diagnosis Date  . GERD (gastroesophageal reflux disease)   . Cyclical vomiting syndrome     Past Surgical History  Procedure Laterality Date  . Esophagogastroduodenoscopy (egd) with propofol N/A 06/21/2014    Procedure: ESOPHAGOGASTRODUODENOSCOPY (EGD) WITH PROPOFOL. Looking in the esophagus stomach and upper small intestine with a lighted tube to examine and treat.;  Surgeon: Lollie Sails, MD;  [facility] END[facility];  Service: Endoscopy;  Laterality: N/A;    Prior to Admission medications   Medication Sig Start  Date End Date Taking? Authorizing Provider  ondansetron (ZOFRAN ODT) 4 MG disintegrating tablet Take 1 tablet (4 mg total) by mouth every 8 (eight) hours as needed for nausea or vomiting. Patient not taking: Reported on 08/09/2014 06/22/14   Aldean Jewett, MD  pantoprazole (PROTONIX) 40 MG tablet Take 1 tablet (40 mg total) by mouth 2 (two) times daily. Patient not taking: Reported on 08/09/2014 06/22/14   Aldean Jewett, MD  promethazine (PHENERGAN) 12.5 MG tablet Take 1 tablet (12.5 mg total) by mouth every 6 (six) hours as needed for nausea or vomiting. Patient not taking: Reported on 08/09/2014 06/15/14   Lavonia Drafts, MD    Family History  Problem Relation Age of Onset  . Diabetes Mother   . Diabetes Father      History  Substance Use Topics  . Smoking status: Current Every Day Smoker -- 1.50 packs/day  . Smokeless tobacco: Not on file  . Alcohol Use: Yes     Comment: 6-7 beers a week    Allergies as of 08/09/2014 - Review Complete 08/09/2014  Allergen Reaction Noted  . Penicillins Itching and Rash 06/15/2014    Review of Systems:    All systems reviewed and negative except where noted in HPI.   Physical Exam:  Vital signs in last 24 hours: Temp:  [98.2 F (36.8 C)-98.6 F (37 C)] 98.2 F (36.8 C) (07/26 0737) Pulse Rate:  [60-105] 87 (07/26 0737) Resp:  [  16-18] 18 (07/26 0737) BP: (105-142)/(68-90) 111/68 mmHg (07/26 0737) SpO2:  [94 %-100 %] 97 % (07/26 0737) FiO2 (%):  [21 %] 21 % (07/25 2358) Weight:  [140 lb 8 oz (63.73 kg)-145 lb (65.772 kg)] 140 lb 8 oz (63.73 kg) (07/25 2357) Last BM Date: 08/08/14 General:   Pleasant, cooperative in NAD Head:  Normocephalic and atraumatic. Eyes:   No icterus.   Conjunctiva pink. PERRLA. Ears:  Normal auditory acuity. Neck:  Supple; no masses or thyroidomegaly Lungs: Respirations even and unlabored. Lungs clear to auscultation bilaterally.   No wheezes, crackles, or rhonchi.  Heart:  Regular rate and rhythm;  Without  murmur, clicks, rubs or gallops Abdomen:  Soft, nondistended, with tenderness diffusely which is musculoskeletal in nature.. Normal bowel sounds. No appreciable masses or hepatomegaly.  No rebound or guarding.  Rectal:  Not performed. Msk:  Symmetrical without gross deformities.  Strength equal bilaterally  Extremities:  Without edema, cyanosis or clubbing. Neurologic:  Alert and oriented x3;  grossly normal neurologically. Skin:  Intact without significant lesions or rashes. Cervical Nodes:  No significant cervical adenopathy. Psych:  Alert and cooperative. Normal affect.  LAB RESULTS:  Recent Labs  08/09/14 1957 08/10/14 0102 08/10/14 0505 08/10/14 1204  WBC 15.8*  --  15.7*  --   HGB 16.8 15.1 14.9 14.8  HCT 49.6 45.3 45.2 43.5  PLT 320  --  267  --    BMET  Recent Labs  08/09/14 1957 08/10/14 0505  NA 139 140  K 3.7 3.0*  CL 96* 106  CO2 27 25  GLUCOSE 180* 136*  BUN 10 9  CREATININE 0.95 0.77  CALCIUM 9.8 8.3*   LFT  Recent Labs  08/10/14 0505  PROT 6.4*  ALBUMIN 4.0  AST 16  ALT 12*  ALKPHOS 44  BILITOT 0.8   PT/INR No results for input(s): LABPROT, INR in the last 72 hours.  STUDIES: No results found.    Impression / Plan:   Dakota Bowen is a 34 y.o. y/o male with cyclic vomiting syndrome which she thinks may be caused by his constant reflux. The patient is on a PPI at the present time. The patient also is on H2 blocker. The patient's medication has not been taken regularly because he states that he ran out of the Protonix. The patient does not need another EGD at this time after having an upper endoscopy last month. I would treat him symptomatically with antiemetics medication and acid suppression. The patient wants to be started on a clear liquid diet and I will do that for him today.   Thank you for involving me in the care of this patient.      LOS: 1 day   Ollen Bowl, MD  08/10/2014, 12:33 PM

## 2014-08-11 DIAGNOSIS — E440 Moderate protein-calorie malnutrition: Secondary | ICD-10-CM | POA: Insufficient documentation

## 2014-08-11 LAB — CBC
HCT: 44.2 % (ref 40.0–52.0)
HEMOGLOBIN: 14.8 g/dL (ref 13.0–18.0)
MCH: 32.3 pg (ref 26.0–34.0)
MCHC: 33.6 g/dL (ref 32.0–36.0)
MCV: 96.2 fL (ref 80.0–100.0)
Platelets: 237 10*3/uL (ref 150–440)
RBC: 4.59 MIL/uL (ref 4.40–5.90)
RDW: 14 % (ref 11.5–14.5)
WBC: 12.2 10*3/uL — ABNORMAL HIGH (ref 3.8–10.6)

## 2014-08-11 LAB — BASIC METABOLIC PANEL
Anion gap: 8 (ref 5–15)
CHLORIDE: 105 mmol/L (ref 101–111)
CO2: 26 mmol/L (ref 22–32)
CREATININE: 0.74 mg/dL (ref 0.61–1.24)
Calcium: 8.4 mg/dL — ABNORMAL LOW (ref 8.9–10.3)
GFR calc Af Amer: >60 mL/min (ref >60)
GFR calc non Af Amer: >60 mL/min (ref >60)
Glucose, Bld: 120 mg/dL — ABNORMAL HIGH (ref 65–99)
Potassium: 3.3 mmol/L — ABNORMAL LOW (ref 3.5–5.1)
Sodium: 139 mmol/L (ref 135–145)

## 2014-08-11 NOTE — Progress Notes (Signed)
[facility] [place] NAME: Darsh Vandevoort    MR#:  [id number]  DATE OF BIRTH:  January 15, 1981  SUBJECTIVE:  Patient still having nausea and vomiting unable to take in by mouth's. REVIEW OF SYSTEMS:    Review of Systems  Constitutional: Negative for fever, chills and malaise/fatigue.  HENT: Negative for congestion, hearing loss and sore throat.   Eyes: Negative for blurred vision.  Respiratory: Negative for cough, hemoptysis, shortness of breath and wheezing.   Cardiovascular: Negative for chest pain, palpitations and leg swelling.  Gastrointestinal: Positive for heartburn, nausea, vomiting and abdominal pain. Negative for diarrhea, blood in stool and melena.  Genitourinary: Negative for dysuria, urgency, frequency, hematuria and flank pain.  Musculoskeletal: Negative for back pain.  Neurological: Negative for dizziness, tremors and headaches.  Endo/Heme/Allergies: Does not bruise/bleed easily.    Tolerating Diet:NO      DRUG ALLERGIES:   Allergies  Allergen Reactions  . Penicillins Itching and Rash    VITALS:  Blood pressure 139/95, pulse 60, temperature 98 F (36.7 C), temperature source Oral, resp. rate 16, height 6' (1.829 m), weight 63.73 kg (140 lb 8 oz), SpO2 100 %.  PHYSICAL EXAMINATION:   Physical Exam  Constitutional: He is oriented to person, place, and time and well-developed, well-nourished, and in no distress. No distress.  HENT:  Head: Normocephalic and atraumatic.  Eyes: No scleral icterus.  Neck: Normal range of motion. Neck supple. No JVD present. No tracheal deviation present.  Cardiovascular: Normal rate, regular rhythm and normal heart sounds.  Exam reveals no gallop and no friction rub.   No murmur heard. Pulmonary/Chest: Effort normal and breath sounds normal. No respiratory distress. He has no wheezes. He has no rales. He exhibits no tenderness.  Abdominal: Soft. Bowel sounds are normal. He  exhibits no distension and no mass. There is no tenderness. There is no rebound and no guarding.  Musculoskeletal: Normal range of motion. He exhibits no edema.  Neurological: He is alert and oriented to person, place, and time.  Skin: Skin is warm. No rash noted. No erythema.  Psychiatric: Affect and judgment normal.      LABORATORY PANEL:   CBC  Recent Labs Lab 08/11/14 0422  WBC 12.2*  HGB 14.8  HCT 44.2  PLT 237   ------------------------------------------------------------------------------------------------------------------  Chemistries   Recent Labs Lab 08/10/14 0505 08/11/14 0422  NA 140 139  K 3.0* 3.3*  CL 106 105  CO2 25 26  GLUCOSE 136* 120*  BUN 9 <5*  CREATININE 0.77 0.74  CALCIUM 8.3* 8.4*  MG 1.7  --   AST 16  --   ALT 12*  --   ALKPHOS 44  --   BILITOT 0.8  --    ------------------------------------------------------------------------------------------------------------------  Cardiac Enzymes No results for input(s): TROPONINI in the last 168 hours. ------------------------------------------------------------------------------------------------------------------  RADIOLOGY:  No results found.   ASSESSMENT AND PLAN:   34 year old male with history of cyclic vomiting syndrome who presented with 3 days of nausea and vomiting along with abdominal pain.  1. Intractable nausea and vomiting with hematemesis and positive stool for occult blood: Patient does have a history of cyclic vomiting syndrome. Patient will continue on supportive care. Hemoglobin remains relatively stable. Continue PPI. Continue IV fluids. Patient is currently denying hematochezia or melanoma. He has had a recent EGD showing duodenitis. GI has seen the patient and no further workup at this time.  2. History of gastric ulcer and duodenitis: Continue PPI as  per GI consultation. 3. Tobacco dependence: Patient encouraged to stop smoking. Nicotine patch has been ordered. 4.  Hypokalemia: Potassium has been repleted. Continue to monitor potassium.  Management plans discussed with the patient and he is in agreement.  CODE STATUS: FULL  TOTAL TIME TAKING CARE OF THIS PATIENT: 25 minutes.   Greater than 50% counseling and coordination of care  POSSIBLE D/C  tomorrow DEPENDING ON CLINICAL CONDITION.   Tiny Rietz M.D on 08/11/2014 at 11:27 AM  Between 7am to 6pm - Pager - [phone] After 6pm go to [email or web address] - password [email or web address] [facility]  [facility]  Office  [phone]  CC: Primary care physician; No PCP Per Patient

## 2014-08-12 DIAGNOSIS — R1110 Vomiting, unspecified: Secondary | ICD-10-CM

## 2014-08-12 LAB — LIPASE, BLOOD: Lipase: 15 U/L — ABNORMAL LOW (ref 22–51)

## 2014-08-12 MED ORDER — SUCRALFATE 1 GM/10ML PO SUSP
1.0000 g | Freq: Three times a day (TID) | ORAL | Status: DC
  Administered 2014-08-13 (×2): 1 g via ORAL
  Filled 2014-08-12 (×3): qty 10

## 2014-08-12 MED ORDER — METOCLOPRAMIDE HCL 5 MG PO TABS
10.0000 mg | ORAL_TABLET | Freq: Three times a day (TID) | ORAL | Status: DC
  Administered 2014-08-12 – 2014-08-13 (×3): 10 mg via ORAL
  Filled 2014-08-12 (×3): qty 2

## 2014-08-12 MED ORDER — SODIUM CHLORIDE 0.9 % IV SOLN
INTRAVENOUS | Status: DC
Start: 2014-08-12 — End: 2014-08-13
  Administered 2014-08-12 – 2014-08-13 (×2): via INTRAVENOUS

## 2014-08-12 NOTE — Care Management (Signed)
Spoke with patient concerning discharge planning. Patient is uninsured.  Patient income is to great to qualify for charitable help with medications. Has no PCP at this time . Gave patient list of providers that will take uninsured. [facility].  Also gave patient information on GoodRx for medication coupons. Provided patient coupon for Protonix which he stated he was having difficulty affording. Patient expressed appreciations for services.

## 2014-08-12 NOTE — Progress Notes (Signed)
Pt states he still cannot keep any liquids down. Does not want to try soft foods that were ordered for lunch. Still has nausea and abdominal pain even after PRNs given.

## 2014-08-12 NOTE — Progress Notes (Signed)
Primary Care Physician: No PCP Per Patient  Primary Gastroenterologist:  Dr. Lucilla Lame  Chief Complaint  Patient presents with  . Hematemesis  . Emesis    HPI: Dakota Bowen is a 34 y.o. male who was admitted with cyclic vomiting syndrome. The patient states that he is doing better today and was able to take in some solid food.  Current Facility-Administered Medications  Medication Dose Route Frequency Provider Last Rate Last Dose  . acetaminophen (TYLENOL) tablet 650 mg  650 mg Oral Q6H PRN Nicholes Mango, MD       Or  . acetaminophen (TYLENOL) suppository 650 mg  650 mg Rectal Q6H PRN Nicholes Mango, MD      . antiseptic oral rinse (CPC / CETYLPYRIDINIUM CHLORIDE 0.05%) solution 7 mL  7 mL Mouth Rinse q12n4p Nicholes Mango, MD   7 mL at 08/12/14 1724  . chlorhexidine (PERIDEX) 0.12 % solution 15 mL  15 mL Mouth Rinse BID Nicholes Mango, MD   15 mL at 08/12/14 0908  . metoCLOPramide (REGLAN) tablet 10 mg  10 mg Oral TID AC & HS Bettey Costa, MD   10 mg at 08/12/14 1724  . morphine 2 MG/ML injection 2 mg  2 mg Intravenous Q4H PRN Nicholes Mango, MD   2 mg at 08/12/14 1432  . nicotine (NICODERM CQ - dosed in mg/24 hours) patch 21 mg  21 mg Transdermal Daily Nicholes Mango, MD   21 mg at 08/12/14 1134  . ondansetron (ZOFRAN) tablet 4 mg  4 mg Oral Q6H PRN Nicholes Mango, MD       Or  . ondansetron (ZOFRAN) injection 4 mg  4 mg Intravenous Q6H PRN Nicholes Mango, MD   4 mg at 08/12/14 1729  . pantoprazole (PROTONIX) injection 40 mg  40 mg Intravenous Q12H Nicholes Mango, MD   40 mg at 08/12/14 1035  . pneumococcal 23 valent vaccine (PNU-IMMUNE) injection 0.5 mL  0.5 mL Intramuscular Tomorrow-1000 Aruna Gouru, MD      . potassium chloride 20 MEQ/15ML (10%) solution 40 mEq  40 mEq Oral Once Bettey Costa, MD   40 mEq at 08/10/14 1814  . promethazine (PHENERGAN) injection 12.5 mg  12.5 mg Intravenous Q6H PRN Nicholes Mango, MD   12.5 mg at 08/12/14 1530  . sucralfate (CARAFATE) 1 GM/10ML suspension 1 g  1 g Oral TID WC  & HS Bettey Costa, MD   1 g at 08/12/14 1723    Allergies as of 08/09/2014 - Review Complete 08/09/2014  Allergen Reaction Noted  . Penicillins Itching and Rash 06/15/2014    ROS:  General: Negative for anorexia, weight loss, fever, chills, fatigue, weakness. ENT: Negative for hoarseness, difficulty swallowing , nasal congestion. CV: Negative for chest pain, angina, palpitations, dyspnea on exertion, peripheral edema.  Respiratory: Negative for dyspnea at rest, dyspnea on exertion, cough, sputum, wheezing.  GI: See history of present illness. GU:  Negative for dysuria, hematuria, urinary incontinence, urinary frequency, nocturnal urination.  Endo: Negative for unusual weight change.    Physical Examination:   BP 115/83 mmHg  Pulse 86  Temp(Src) 98.8 F (37.1 C) (Oral)  Resp 18  Ht 6' (1.829 m)  Wt 140 lb 8 oz (63.73 kg)  BMI 19.05 kg/m2  SpO2 100%  General: Well-nourished, well-developed in no acute distress.  Eyes: No icterus. Conjunctivae pink.  Extremities: No lower extremity edema. No clubbing or deformities. Neuro: Alert and oriented x 3.  Grossly intact. Skin: Warm and dry, no jaundice.  Psych: Alert and cooperative, normal mood and affect.  Labs:    Imaging Studies: No results found.  Assessment and Plan:   Dakota Bowen is a 34 y.o. y/o male who has cyclic vomiting syndrome and states he is feeling better today the patient has had upper endoscopies in the past with no finding to explain his symptoms found. The patient should continue to be treated symptomatically.

## 2014-08-12 NOTE — Progress Notes (Signed)
[facility] [place] NAME: Dakota Bowen    MR#:  [id number]  DATE OF BIRTH:  08/05/1980  SUBJECTIVE:  Patient still with nausea and vomiting. Patient wants to go home but he feels nauseous and was unable to tolerate his clear liquid diet this morning. REVIEW OF SYSTEMS:    Review of Systems  Constitutional: Negative for fever, chills and malaise/fatigue.  HENT: Negative for congestion, hearing loss and sore throat.   Eyes: Negative for blurred vision.  Respiratory: Negative for cough, hemoptysis, shortness of breath and wheezing.   Cardiovascular: Negative for chest pain, palpitations and leg swelling.  Gastrointestinal: Positive for heartburn, nausea, vomiting and abdominal pain. Negative for diarrhea, blood in stool and melena.  Genitourinary: Negative for dysuria, urgency, frequency, hematuria and flank pain.  Musculoskeletal: Negative for back pain.  Neurological: Negative for dizziness, tremors and headaches.  Endo/Heme/Allergies: Does not bruise/bleed easily.    Tolerating Diet:NO      DRUG ALLERGIES:   Allergies  Allergen Reactions  . Penicillins Itching and Rash    VITALS:  Blood pressure 112/76, pulse 78, temperature 98.3 F (36.8 C), temperature source Oral, resp. rate 16, height 6' (1.829 m), weight 63.73 kg (140 lb 8 oz), SpO2 98 %.  PHYSICAL EXAMINATION:   Physical Exam  Constitutional: He is oriented to person, place, and time and well-developed, well-nourished, and in no distress. No distress.  HENT:  Head: Normocephalic and atraumatic.  Eyes: No scleral icterus.  Neck: Normal range of motion. Neck supple. No JVD present. No tracheal deviation present.  Cardiovascular: Normal rate, regular rhythm and normal heart sounds.  Exam reveals no gallop and no friction rub.   No murmur heard. Pulmonary/Chest: Effort normal and breath sounds normal. No respiratory distress. He has no wheezes. He has no rales.  He exhibits no tenderness.  Abdominal: Soft. Bowel sounds are normal. He exhibits no distension and no mass. There is no tenderness. There is no rebound and no guarding.  Musculoskeletal: Normal range of motion. He exhibits no edema.  Neurological: He is alert and oriented to person, place, and time.  Skin: Skin is warm. No rash noted. No erythema.  Psychiatric: Affect and judgment normal.      LABORATORY PANEL:   CBC  Recent Labs Lab 08/11/14 0422  WBC 12.2*  HGB 14.8  HCT 44.2  PLT 237   ------------------------------------------------------------------------------------------------------------------  Chemistries   Recent Labs Lab 08/10/14 0505 08/11/14 0422  NA 140 139  K 3.0* 3.3*  CL 106 105  CO2 25 26  GLUCOSE 136* 120*  BUN 9 <5*  CREATININE 0.77 0.74  CALCIUM 8.3* 8.4*  MG 1.7  --   AST 16  --   ALT 12*  --   ALKPHOS 44  --   BILITOT 0.8  --    ------------------------------------------------------------------------------------------------------------------  Cardiac Enzymes No results for input(s): TROPONINI in the last 168 hours. ------------------------------------------------------------------------------------------------------------------  RADIOLOGY:  No results found.   ASSESSMENT AND PLAN:   34 year old male with history of cyclic vomiting syndrome who presented with 3 days of nausea and vomiting along with abdominal pain.  1. Intractable nausea and vomiting with hematemesis and positive stool for occult blood: Patient does have a history of cyclic vomiting syndrome. Patient will continue on supportive care. Hemoglobin remains relatively stable. Continue PPI. Continue IV fluids. Patient is currently denying hematochezia or melena. He has had a recent EGD showing duodenitis. GI has seen the patient and no further workup  at this time. I will try Reglan to see if this helps with his nausea and vomiting.   2. History of gastric ulcer and  duodenitis: Continue PPI as per GI consultation. 3. Tobacco dependence: Patient encouraged to stop smoking. Nicotine patch has been ordered. 4. Hypokalemia: Replete potassium this morning and repeat in a.m. Management plans discussed with the patient and he is in agreement.  CODE STATUS: FULL  TOTAL TIME TAKING CARE OF THIS PATIENT: 25 minutes.   Greater than 50% counseling and coordination of care  POSSIBLE D/C  tomorrow DEPENDING ON CLINICAL CONDITION.   Sabir Charters M.D on 08/12/2014 at 11:19 AM  Between 7am to 6pm - Pager - [phone] After 6pm go to [email or web address] - password [email or web address] [facility]  [facility]  Office  [phone]  CC: Primary care physician; No PCP Per Patient

## 2014-08-13 LAB — BASIC METABOLIC PANEL
ANION GAP: 10 (ref 5–15)
BUN: 9 mg/dL (ref 6–20)
CO2: 26 mmol/L (ref 22–32)
Calcium: 8.9 mg/dL (ref 8.9–10.3)
Chloride: 102 mmol/L (ref 101–111)
Creatinine, Ser: 0.85 mg/dL (ref 0.61–1.24)
Glucose, Bld: 90 mg/dL (ref 65–99)
Potassium: 2.9 mmol/L — CL (ref 3.5–5.1)
Sodium: 138 mmol/L (ref 135–145)

## 2014-08-13 LAB — MAGNESIUM: Magnesium: 1.7 mg/dL (ref 1.7–2.4)

## 2014-08-13 LAB — POTASSIUM: Potassium: 3.3 mmol/L — ABNORMAL LOW (ref 3.5–5.1)

## 2014-08-13 MED ORDER — PANTOPRAZOLE SODIUM 40 MG PO TBEC
40.0000 mg | DELAYED_RELEASE_TABLET | Freq: Two times a day (BID) | ORAL | Status: DC
  Administered 2014-08-13: 40 mg via ORAL
  Filled 2014-08-13: qty 1

## 2014-08-13 MED ORDER — POTASSIUM CHLORIDE 10 MEQ/100ML IV SOLN
10.0000 meq | INTRAVENOUS | Status: AC
  Administered 2014-08-13 (×3): 10 meq via INTRAVENOUS
  Filled 2014-08-13 (×4): qty 100

## 2014-08-13 MED ORDER — METOCLOPRAMIDE HCL 10 MG PO TABS: 10.0000 mg | ORAL_TABLET | Freq: Three times a day (TID) | ORAL | Status: DC

## 2014-08-13 MED ORDER — NICOTINE 21 MG/24HR TD PT24: 21.0000 mg | MEDICATED_PATCH | Freq: Every day | TRANSDERMAL | Status: DC

## 2014-08-13 MED ORDER — SODIUM CHLORIDE 0.9 % IV SOLN
1.0000 g | Freq: Once | INTRAVENOUS | Status: AC
  Administered 2014-08-13: 1 g via INTRAVENOUS
  Filled 2014-08-13: qty 2

## 2014-08-13 NOTE — Progress Notes (Signed)
Nutrition Follow-up  DOCUMENTATION CODES:   Non-severe (moderate) malnutrition in context of chronic illness  INTERVENTION:   Meals and Snacks: Cater to patient preferences Medical Food Supplement Therapy: spoke with pt regarding supplementation and pt not wanting to add any supplements at this time. Will readdress on follow if intake remains poor.   NUTRITION DIAGNOSIS:   Inadequate oral intake related to altered GI function as evidenced by NPO status; improved with diet advancement  GOAL:   Patient will meet greater than or equal to 90% of their needs; ongoing  MONITOR:    (Energy intake, Gastrointestinal profile)   ASSESSMENT:   Pt admitted with nausea, vomiting, duodenitis. Pt with nausea this am however without emesis after breakfast thus far.  Diet Order:  DIET SOFT Room service appropriate?: Yes; Fluid consistency:: Thin    Current Nutrition: Pt ate 100% of french toast and coffee this am at breakfast. Pt reports so far has tolerated however does feel nauseous. Pt on solid food diet order yesterday however was not tolerating.  Gastrointestinal Profile: emesis times 3 yesterday Last BM: 08/08/2014   Medications: NS at 122mL/hr, carafate, protonix, reglan, MgS, KCl given  Electrolyte/Renal Profile and Glucose Profile:   Recent Labs Lab 08/10/14 0505 08/11/14 0422 08/13/14 0421  NA 140 139 138  K 3.0* 3.3* 2.9*  CL 106 105 102  CO2 25 26 26   BUN 9 <5* 9  CREATININE 0.77 0.74 0.85  CALCIUM 8.3* 8.4* 8.9  MG 1.7  --  1.7  GLUCOSE 136* 120* 90   Protein Profile:  Recent Labs Lab 08/09/14 1957 08/10/14 0505  ALBUMIN 4.9 4.0     Weight Trend since Admission: Filed Weights   08/09/14 1729 08/09/14 2357  Weight: 145 lb (65.772 kg) 140 lb 8 oz (63.73 kg)    Skin:  Reviewed, no issues   Ideal Body Weight:   81kg  BMI:  Body mass index is 19.05 kg/(m^2).  Estimated Nutritional Needs:   Kcal:  Using IBW of 81kg (BEE 1778 kcals (IF 1.0-1.2, AF  1.3) 2311-2773 kcals/d  Protein:  Using IBW of 81kg (1.0-1.2 g/d)81-97 g/d  Fluid:  Using IBW of 81 kg (30-63ml/kg) 2430-283ml/d  EDUCATION NEEDS:   No education needs identified at this time   [place], RD, LDN Pager [phone]

## 2014-08-13 NOTE — Discharge Instructions (Signed)
Please contact your physician for further difficulty controlling your nausea and vomiting or pain; bloody stools or vomit; or any other questions or concerns.   Gastritis, Adult Gastritis is soreness and swelling (inflammation) of the lining of the stomach. Gastritis can develop as a sudden onset (acute) or long-term (chronic) condition. If gastritis is not treated, it can lead to stomach bleeding and ulcers. CAUSES  Gastritis occurs when the stomach lining is weak or damaged. Digestive juices from the stomach then inflame the weakened stomach lining. The stomach lining may be weak or damaged due to viral or bacterial infections. One common bacterial infection is the Helicobacter pylori infection. Gastritis can also result from excessive alcohol consumption, taking certain medicines, or having too much acid in the stomach.  SYMPTOMS  In some cases, there are no symptoms. When symptoms are present, they may include:  Pain or a burning sensation in the upper abdomen.  Nausea.  Vomiting.  An uncomfortable feeling of fullness after eating. DIAGNOSIS  Your caregiver may suspect you have gastritis based on your symptoms and a physical exam. To determine the cause of your gastritis, your caregiver may perform the following:  Blood or stool tests to check for the H pylori bacterium.  Gastroscopy. A thin, flexible tube (endoscope) is passed down the esophagus and into the stomach. The endoscope has a light and camera on the end. Your caregiver uses the endoscope to view the inside of the stomach.  Taking a tissue sample (biopsy) from the stomach to examine under a microscope. TREATMENT  Depending on the cause of your gastritis, medicines may be prescribed. If you have a bacterial infection, such as an H pylori infection, antibiotics may be given. If your gastritis is caused by too much acid in the stomach, H2 blockers or antacids may be given. Your caregiver may recommend that you stop taking  aspirin, ibuprofen, or other nonsteroidal anti-inflammatory drugs (NSAIDs). HOME CARE INSTRUCTIONS  Only take over-the-counter or prescription medicines as directed by your caregiver.  If you were given antibiotic medicines, take them as directed. Finish them even if you start to feel better.  Drink enough fluids to keep your urine clear or pale yellow.  Avoid foods and drinks that make your symptoms worse, such as:  Caffeine or alcoholic drinks.  Chocolate.  Peppermint or mint flavorings.  Garlic and onions.  Spicy foods.  Citrus fruits, such as oranges, lemons, or limes.  Tomato-based foods such as sauce, chili, salsa, and pizza.  Fried and fatty foods.  Eat small, frequent meals instead of large meals. SEEK IMMEDIATE MEDICAL CARE IF:   You have black or dark red stools.  You vomit blood or material that looks like coffee grounds.  You are unable to keep fluids down.  Your abdominal pain gets worse.  You have a fever.  You do not feel better after 1 week.  You have any other questions or concerns. MAKE SURE YOU:  Understand these instructions.  Will watch your condition.  Will get help right away if you are not doing well or get worse. Document Released: 12/26/2000 Document Revised: 07/03/2011 Document Reviewed: 02/14/2011 [facility] Patient Information 2015 [place], [place]. This information is not intended to replace advice given to you by your health care provider. Make sure you discuss any questions you have with your health care provider.

## 2014-08-13 NOTE — Discharge Summary (Signed)
[place] at [employer] NAME: Dakota Bowen    MR#:  [id number]  DATE OF BIRTH:  25-Sep-1980  DATE OF ADMISSION:  08/09/2014 ADMITTING PHYSICIAN: Nicholes Mango, MD  DATE OF DISCHARGE: 08/13/2014 PRIMARY CARE PHYSICIAN: No PCP Per Patient    ADMISSION DIAGNOSIS:  Acute upper GI bleed [K92.2] Hematemesis with nausea [K92.0, R11.0]  DISCHARGE DIAGNOSIS:  Active Problems:   Hematemesis   Hematemesis with nausea   Malnutrition of moderate degree   SECONDARY DIAGNOSIS:   Past Medical History  Diagnosis Date  . GERD (gastroesophageal reflux disease)   . Cyclical vomiting syndrome     HOSPITAL COURSE:  34 year old male with history of cyclic vomiting syndrome who presented with 3 days of nausea and vomiting along with abdominal pain.  1. Intractable nausea and vomiting with hematemesis and positive stool for occult blood: Patient does have a history of cyclic vomiting syndrome. Patient will continue on supportive care. Hemoglobin remains relatively stable. Continue PPI. Continue IV fluids. Patient is currently denying hematochezia or melena. He has had a recent EGD showing duodenitis. GI has seen the patient and no further workup at this time. Reglan seemed to help with his nausea and vomiting. We will continue with PPI, Zofran and Reglan.   2. History of gastric ulcer and duodenitis: Continue PPI as per GI consultation. 3. Tobacco dependence: Patient encouraged to stop smoking. Nicotine patch has been ordered. 4. Hypokalemia and low magnesium level: Sodium and magnesium will be repleted and rechecked prior to discharge. DISCHARGE CONDITIONS AND DIET:  Patient is being discharged home in stable condition on a regular diet  CONSULTS OBTAINED:  Treatment Team:  Nicholes Mango, MD Lucilla Lame, MD  DRUG ALLERGIES:   Allergies  Allergen Reactions  . Penicillins Itching and Rash    DISCHARGE MEDICATIONS:   Current Discharge  Medication List    START taking these medications   Details  metoCLOPramide (REGLAN) 10 MG tablet Take 1 tablet (10 mg total) by mouth 4 (four) times daily -  before meals and at bedtime. Qty: 90 tablet, Refills: 0    nicotine (NICODERM CQ - DOSED IN MG/24 HOURS) 21 mg/24hr patch Place 1 patch (21 mg total) onto the skin daily. Qty: 28 patch, Refills: 0      CONTINUE these medications which have NOT CHANGED   Details  ondansetron (ZOFRAN ODT) 4 MG disintegrating tablet Take 1 tablet (4 mg total) by mouth every 8 (eight) hours as needed for nausea or vomiting. Qty: 20 tablet, Refills: 0    pantoprazole (PROTONIX) 40 MG tablet Take 1 tablet (40 mg total) by mouth 2 (two) times daily. Qty: 60 tablet, Refills: 0    promethazine (PHENERGAN) 12.5 MG tablet Take 1 tablet (12.5 mg total) by mouth every 6 (six) hours as needed for nausea or vomiting. Qty: 20 tablet, Refills: 0              Today   CHIEF COMPLAINT:  Patient was able to tolerate his [place] toast this morning. Denies abdominal pain   VITAL SIGNS:  Blood pressure 108/77, pulse 78, temperature 97.8 F (36.6 C), temperature source Oral, resp. rate 16, height 6' (1.829 m), weight 63.73 kg (140 lb 8 oz), SpO2 100 %.   REVIEW OF SYSTEMS:  Review of Systems  Constitutional: Negative for fever, chills and malaise/fatigue.  HENT: Negative for sore throat.   Eyes: Negative for blurred vision.  Respiratory: Negative for cough, hemoptysis, shortness of breath and  wheezing.   Cardiovascular: Negative for chest pain, palpitations and leg swelling.  Gastrointestinal: Positive for heartburn. Negative for nausea, vomiting, abdominal pain, diarrhea and blood in stool.  Genitourinary: Negative for dysuria.  Musculoskeletal: Negative for back pain.  Neurological: Negative for dizziness, tremors and headaches.  Endo/Heme/Allergies: Does not bruise/bleed easily.     PHYSICAL EXAMINATION:  GENERAL:  34 y.o.-year-old patient  lying in the bed with no acute distress.  NECK:  Supple, no jugular venous distention. No thyroid enlargement, no tenderness.  LUNGS: Normal breath sounds bilaterally, no wheezing, rales,rhonchi  No use of accessory muscles of respiration.  CARDIOVASCULAR: S1, S2 normal. No murmurs, rubs, or gallops.  ABDOMEN: Soft, non-tender, non-distended. Bowel sounds present. No organomegaly or mass.  EXTREMITIES: No pedal edema, cyanosis, or clubbing.  PSYCHIATRIC: The patient is alert and oriented x 3.  SKIN: No obvious rash, lesion, or ulcer.   DATA REVIEW:   CBC  Recent Labs Lab 08/11/14 0422  WBC 12.2*  HGB 14.8  HCT 44.2  PLT 237    Chemistries   Recent Labs Lab 08/10/14 0505  08/13/14 0421  NA 140  < > 138  K 3.0*  < > 2.9*  CL 106  < > 102  CO2 25  < > 26  GLUCOSE 136*  < > 90  BUN 9  < > 9  CREATININE 0.77  < > 0.85  CALCIUM 8.3*  < > 8.9  MG 1.7  --  1.7  AST 16  --   --   ALT 12*  --   --   ALKPHOS 44  --   --   BILITOT 0.8  --   --   < > = values in this interval not displayed.  Cardiac Enzymes No results for input(s): TROPONINI in the last 168 hours.  Microbiology Results  @[email or web address] @  RADIOLOGY:  No results found.    Management plans discussed with the patient and he is in agreement. Stable for discharge home  Patient should follow up with GI in 1 week  CODE STATUS:     Code Status Orders        Start     Ordered   08/09/14 2358  Full code   Continuous     08/09/14 2357      TOTAL TIME TAKING CARE OF THIS PATIENT: 35 minutes.    [name] M.D on 08/13/2014 at 12:32 PM  Between 7am to 6pm - Pager - [phone] After 6pm go to [email or web address] - password [email or web address] [facility]  [place] [facility]  Office  [phone]  CC: Primary care physician; No PCP Per Patient

## 2014-08-13 NOTE — Progress Notes (Signed)
Pt VSS, Given morphine once for pain, Pt tolerating diet, Pt received discharge orders. Discharge instructions were reviewed with pt with all questions answered. IV removed with dressing dry and intact.

## 2014-08-13 NOTE — Care Management Note (Signed)
Case Management Note  Patient Details  Name: Dakota Bowen MRN: [id number] Date of Birth: 1980/05/28  Subjective/Objective:                    Action/Plan: Patient discharging to home today. He is self-pay but states he can afford his Rx. He also states that he has received a list of local physicians that he will choice and contact for establishment. No RNCM needs per patient. Case closed.   Expected Discharge Date:                  Expected Discharge Plan:     In-House Referral:     Discharge planning Services  CM Consult  Post Acute Care Choice:    Choice offered to:  Patient  DME Arranged:    DME Agency:     HH Arranged:    [place] Agency:     Status of Service:  Completed, signed off  Medicare Important Message Given:    Date Medicare IM Given:    Medicare IM give by:    Date Additional Medicare IM Given:    Additional Medicare Important Message give by:     If discussed at [employer] of Stay Meetings, dates discussed:    Additional Comments:  Marshell Garfinkel, RN 08/13/2014, 10:33 AM

## 2015-05-16 ENCOUNTER — Encounter: Payer: Self-pay | Admitting: *Deleted

## 2015-05-16 ENCOUNTER — Observation Stay
Admission: EM | Admit: 2015-05-16 | Discharge: 2015-05-19 | Disposition: A | Discharge status: Home / Self Care | Payer: Self-pay | Attending: Internal Medicine | Admitting: Internal Medicine

## 2015-05-16 DIAGNOSIS — F17200 Nicotine dependence, unspecified, uncomplicated: Secondary | ICD-10-CM | POA: Insufficient documentation

## 2015-05-16 DIAGNOSIS — Z833 Family history of diabetes mellitus: Secondary | ICD-10-CM | POA: Insufficient documentation

## 2015-05-16 DIAGNOSIS — R1115 Cyclical vomiting syndrome unrelated to migraine: Secondary | ICD-10-CM

## 2015-05-16 DIAGNOSIS — G43A0 Cyclical vomiting, not intractable: Secondary | ICD-10-CM | POA: Insufficient documentation

## 2015-05-16 DIAGNOSIS — Z79899 Other long term (current) drug therapy: Secondary | ICD-10-CM | POA: Insufficient documentation

## 2015-05-16 DIAGNOSIS — K219 Gastro-esophageal reflux disease without esophagitis: Secondary | ICD-10-CM | POA: Insufficient documentation

## 2015-05-16 DIAGNOSIS — Z880 Allergy status to penicillin: Secondary | ICD-10-CM | POA: Insufficient documentation

## 2015-05-16 DIAGNOSIS — R112 Nausea with vomiting, unspecified: Secondary | ICD-10-CM | POA: Diagnosis present

## 2015-05-16 DIAGNOSIS — K2900 Acute gastritis without bleeding: Principal | ICD-10-CM

## 2015-05-16 DIAGNOSIS — R1013 Epigastric pain: Secondary | ICD-10-CM | POA: Insufficient documentation

## 2015-05-16 DIAGNOSIS — R1032 Left lower quadrant pain: Secondary | ICD-10-CM

## 2015-05-16 DIAGNOSIS — K5900 Constipation, unspecified: Secondary | ICD-10-CM

## 2015-05-16 DIAGNOSIS — D72829 Elevated white blood cell count, unspecified: Secondary | ICD-10-CM

## 2015-05-16 DIAGNOSIS — R1084 Generalized abdominal pain: Secondary | ICD-10-CM | POA: Insufficient documentation

## 2015-05-16 DIAGNOSIS — R739 Hyperglycemia, unspecified: Secondary | ICD-10-CM

## 2015-05-16 LAB — COMPREHENSIVE METABOLIC PANEL
ALBUMIN: 5.1 g/dL — AB (ref 3.5–5.0)
ALT: 22 U/L (ref 17–63)
ANION GAP: 15 (ref 5–15)
AST: 27 U/L (ref 15–41)
Alkaline Phosphatase: 62 U/L (ref 38–126)
BILIRUBIN TOTAL: 0.6 mg/dL (ref 0.3–1.2)
BUN: 13 mg/dL (ref 6–20)
CALCIUM: 10.1 mg/dL (ref 8.9–10.3)
CO2: 25 mmol/L (ref 22–32)
Chloride: 101 mmol/L (ref 101–111)
Creatinine, Ser: 1.03 mg/dL (ref 0.61–1.24)
GFR calc non Af Amer: >60 mL/min (ref >60)
GLUCOSE: 156 mg/dL — AB (ref 65–99)
POTASSIUM: 4.3 mmol/L (ref 3.5–5.1)
Sodium: 141 mmol/L (ref 135–145)
TOTAL PROTEIN: 8.1 g/dL (ref 6.5–8.1)

## 2015-05-16 LAB — URINALYSIS COMPLETE WITH MICROSCOPIC (ARMC ONLY)
BILIRUBIN URINE: NEGATIVE
Glucose, UA: 150 mg/dL — AB
Hgb urine dipstick: NEGATIVE
LEUKOCYTES UA: NEGATIVE
NITRITE: NEGATIVE
PH: 9 — AB (ref 5.0–8.0)
PROTEIN: 30 mg/dL — AB
SPECIFIC GRAVITY, URINE: 1.019 (ref 1.005–1.030)
Squamous Epithelial / LPF: NONE SEEN

## 2015-05-16 LAB — CBC
HEMATOCRIT: 48.3 % (ref 40.0–52.0)
HEMOGLOBIN: 16.3 g/dL (ref 13.0–18.0)
MCH: 31 pg (ref 26.0–34.0)
MCHC: 33.7 g/dL (ref 32.0–36.0)
MCV: 91.9 fL (ref 80.0–100.0)
Platelets: 295 10*3/uL (ref 150–440)
RBC: 5.26 MIL/uL (ref 4.40–5.90)
RDW: 13.7 % (ref 11.5–14.5)
WBC: 13.3 10*3/uL — AB (ref 3.8–10.6)

## 2015-05-16 LAB — HEMOGLOBIN A1C: Hgb A1c MFr Bld: 5.2 % (ref 4.0–6.0)

## 2015-05-16 LAB — LIPASE, BLOOD: Lipase: 20 U/L (ref 11–51)

## 2015-05-16 MED ORDER — MORPHINE SULFATE (PF) 2 MG/ML IV SOLN
2.0000 mg | INTRAVENOUS | Status: DC | PRN
  Administered 2015-05-17 – 2015-05-18 (×4): 2 mg via INTRAVENOUS
  Filled 2015-05-16 (×4): qty 1

## 2015-05-16 MED ORDER — ACETAMINOPHEN 325 MG PO TABS: 650.0000 mg | ORAL_TABLET | Freq: Four times a day (QID) | ORAL | Status: DC | PRN

## 2015-05-16 MED ORDER — POTASSIUM CHLORIDE IN NACL 20-0.9 MEQ/L-% IV SOLN
INTRAVENOUS | Status: DC
  Administered 2015-05-16 – 2015-05-19 (×8): via INTRAVENOUS
  Filled 2015-05-16 (×12): qty 1000

## 2015-05-16 MED ORDER — PROCHLORPERAZINE EDISYLATE 5 MG/ML IJ SOLN
5.0000 mg | Freq: Once | INTRAMUSCULAR | Status: AC
  Administered 2015-05-16: 5 mg via INTRAMUSCULAR
  Filled 2015-05-16: qty 2

## 2015-05-16 MED ORDER — ONDANSETRON HCL 4 MG/2ML IJ SOLN
4.0000 mg | Freq: Once | INTRAMUSCULAR | Status: AC
  Administered 2015-05-16: 4 mg via INTRAVENOUS

## 2015-05-16 MED ORDER — ENOXAPARIN SODIUM 40 MG/0.4ML ~~LOC~~ SOLN
40.0000 mg | SUBCUTANEOUS | Status: DC
Start: 2015-05-16 — End: 2015-05-19
  Administered 2015-05-16 – 2015-05-18 (×3): 40 mg via SUBCUTANEOUS
  Filled 2015-05-16 (×3): qty 0.4

## 2015-05-16 MED ORDER — ONDANSETRON HCL 4 MG PO TABS: 4.0000 mg | ORAL_TABLET | Freq: Four times a day (QID) | ORAL | Status: DC | PRN

## 2015-05-16 MED ORDER — PANTOPRAZOLE SODIUM 40 MG IV SOLR
40.0000 mg | Freq: Once | INTRAVENOUS | Status: AC
  Administered 2015-05-16: 40 mg via INTRAVENOUS
  Filled 2015-05-16: qty 40

## 2015-05-16 MED ORDER — ONDANSETRON HCL 4 MG/2ML IJ SOLN
4.0000 mg | Freq: Four times a day (QID) | INTRAMUSCULAR | Status: DC | PRN
  Administered 2015-05-16 – 2015-05-17 (×3): 4 mg via INTRAVENOUS
  Filled 2015-05-16 (×3): qty 2

## 2015-05-16 MED ORDER — DIPHENHYDRAMINE HCL 50 MG/ML IJ SOLN
25.0000 mg | Freq: Once | INTRAMUSCULAR | Status: AC
  Administered 2015-05-16: 25 mg via INTRAVENOUS
  Filled 2015-05-16: qty 1

## 2015-05-16 MED ORDER — METOCLOPRAMIDE HCL 5 MG/ML IJ SOLN
5.0000 mg | Freq: Once | INTRAMUSCULAR | Status: AC
  Administered 2015-05-16: 5 mg via INTRAVENOUS
  Filled 2015-05-16: qty 2

## 2015-05-16 MED ORDER — ACETAMINOPHEN 650 MG RE SUPP: 650.0000 mg | Freq: Four times a day (QID) | RECTAL | Status: DC | PRN

## 2015-05-16 MED ORDER — METOCLOPRAMIDE HCL 5 MG/ML IJ SOLN
5.0000 mg | Freq: Four times a day (QID) | INTRAMUSCULAR | Status: DC
  Administered 2015-05-16 – 2015-05-18 (×8): 5 mg via INTRAVENOUS
  Filled 2015-05-16 (×9): qty 2

## 2015-05-16 MED ORDER — DIAZEPAM 5 MG/ML IJ SOLN
2.0000 mg | Freq: Once | INTRAMUSCULAR | Status: AC
  Administered 2015-05-16: 2 mg via INTRAVENOUS
  Filled 2015-05-16: qty 2

## 2015-05-16 MED ORDER — ONDANSETRON HCL 4 MG/2ML IJ SOLN
INTRAMUSCULAR | Status: AC
  Administered 2015-05-16: 4 mg via INTRAVENOUS
  Filled 2015-05-16: qty 2

## 2015-05-16 MED ORDER — SODIUM CHLORIDE 0.9 % IV BOLUS (SEPSIS)
1000.0000 mL | Freq: Once | INTRAVENOUS | Status: AC
  Administered 2015-05-16: 1000 mL via INTRAVENOUS

## 2015-05-16 MED ORDER — PROCHLORPERAZINE EDISYLATE 5 MG/ML IJ SOLN
5.0000 mg | INTRAMUSCULAR | Status: DC | PRN
  Administered 2015-05-16 – 2015-05-18 (×3): 5 mg via INTRAVENOUS
  Filled 2015-05-16: qty 1
  Filled 2015-05-16 (×2): qty 2
  Filled 2015-05-16: qty 1

## 2015-05-16 NOTE — ED Provider Notes (Signed)
[facility] Emergency Department Provider Note   ____________________________________________  Time seen: Approximately 2:40 AM  I have reviewed the triage vital signs and the nursing notes.   HISTORY  Chief Complaint Emesis    HPI Dakota Bowen is a 35 y.o. male who presents to the ED from home via EMS with a chief complaint of nausea and vomiting. Patient has a history of cyclic vomiting syndrome and reports onset of nausea and vomiting for the past 6 hours. States this feels similar to his past episodes of cyclic vomiting which required hospitalization. Complains of generalized abdominal cramping. Denies associated symptoms of fever, chills, chest pain, shortness of breath, dysuria, diarrhea. Denies recent travel or trauma. Nothing makes his symptoms better or worse.   Past Medical History  Diagnosis Date  . GERD (gastroesophageal reflux disease)   . Cyclical vomiting syndrome     Patient Active Problem List   Diagnosis Date Noted  . Malnutrition of moderate degree ([place]) 08/11/2014  . Hematemesis with nausea   . Hematemesis 08/09/2014  . Duodenitis 06/22/2014  . Gastritis 06/17/2014  . Vomiting 06/15/2014    Past Surgical History  Procedure Laterality Date  . Esophagogastroduodenoscopy (egd) with propofol N/A 06/21/2014    Procedure: ESOPHAGOGASTRODUODENOSCOPY (EGD) WITH PROPOFOL. Looking in the esophagus stomach and upper small intestine with a lighted tube to examine and treat.;  Surgeon: Lollie Sails, MD;  Location: [facility] ENDOSCOPY;  Service: Endoscopy;  Laterality: N/A;    Current Outpatient Rx  Name  Route  Sig  Dispense  Refill  . metoCLOPramide (REGLAN) 10 MG tablet   Oral   Take 1 tablet (10 mg total) by mouth 4 (four) times daily -  before meals and at bedtime.   90 tablet   0   . nicotine (NICODERM CQ - DOSED IN MG/24 HOURS) 21 mg/24hr patch   Transdermal   Place 1 patch (21 mg total) onto the skin daily.   28 patch  0   . ondansetron (ZOFRAN ODT) 4 MG disintegrating tablet   Oral   Take 1 tablet (4 mg total) by mouth every 8 (eight) hours as needed for nausea or vomiting. Patient not taking: Reported on 08/09/2014   20 tablet   0   . pantoprazole (PROTONIX) 40 MG tablet   Oral   Take 1 tablet (40 mg total) by mouth 2 (two) times daily. Patient not taking: Reported on 08/09/2014   60 tablet   0   . promethazine (PHENERGAN) 12.5 MG tablet   Oral   Take 1 tablet (12.5 mg total) by mouth every 6 (six) hours as needed for nausea or vomiting. Patient not taking: Reported on 08/09/2014   20 tablet   0     Allergies Penicillins  Family History  Problem Relation Age of Onset  . Diabetes Mother   . Diabetes Father     Social History Social History  Substance Use Topics  . Smoking status: Current Every Day Smoker -- 1.50 packs/day  . Smokeless tobacco: Not on file  . Alcohol Use: Yes     Comment: 6-7 beers a week    Review of Systems  Constitutional: No fever/chills. Eyes: No visual changes. ENT: No sore throat. Cardiovascular: Denies chest pain. Respiratory: Denies shortness of breath. Gastrointestinal: Positive for abdominal pain, nausea and vomiting.  No diarrhea.  No constipation. Genitourinary: Negative for dysuria. Musculoskeletal: Negative for back pain. Skin: Negative for rash. Neurological: Negative for headaches, focal weakness or numbness.  10-point ROS otherwise negative.  ____________________________________________   PHYSICAL EXAM:  VITAL SIGNS: ED Triage Vitals  Enc Vitals Group     BP 05/16/15 0052 133/86 mmHg     Pulse Rate 05/16/15 0052 55     Resp 05/16/15 0052 22     Temp 05/16/15 0052 98.2 F (36.8 C)     Temp Source 05/16/15 0052 Oral     SpO2 05/16/15 0052 100 %     Weight 05/16/15 0052 145 lb (65.772 kg)     Height 05/16/15 0052 6' (1.829 m)     Head Cir --      Peak Flow --      Pain Score 05/16/15 0203 3     Pain Loc --      Pain Edu? --       Excl. in [place]? --     Constitutional: Alert and oriented. Well appearing and in mild acute distress. Eyes: Conjunctivae are normal. PERRL. EOMI. Head: Atraumatic. Nose: No congestion/rhinnorhea. Mouth/Throat: Mucous membranes are mildly dry.  Oropharynx non-erythematous. Neck: No stridor.   Cardiovascular: Normal rate, regular rhythm. Grossly normal heart sounds.  Good peripheral circulation. Respiratory: Normal respiratory effort.  No retractions. Lungs CTAB. Gastrointestinal: Soft and nontender. No distention. No abdominal bruits. No CVA tenderness. Musculoskeletal: No lower extremity tenderness nor edema.  No joint effusions. Neurologic:  Normal speech and language. No gross focal neurologic deficits are appreciated.  Skin:  Skin is warm, dry and intact. No rash noted. Psychiatric: Mood and affect are normal. Speech and behavior are normal.  ____________________________________________   LABS (all labs ordered are listed, but only abnormal results are displayed)  Labs Reviewed  COMPREHENSIVE METABOLIC PANEL - Abnormal; Notable for the following:    Glucose, Bld 156 (*)    Albumin 5.1 (*)    All other components within normal limits  CBC - Abnormal; Notable for the following:    WBC 13.3 (*)    All other components within normal limits  LIPASE, BLOOD  URINALYSIS COMPLETEWITH MICROSCOPIC ([facility] ONLY)   ____________________________________________  EKG  None ____________________________________________  RADIOLOGY  None ____________________________________________   PROCEDURES  Procedure(s) performed: None  Critical Care performed: No  ____________________________________________   INITIAL IMPRESSION / ASSESSMENT AND PLAN / ED COURSE  Pertinent labs & imaging results that were available during my care of the patient were reviewed by me and considered in my medical decision making (see chart for details).  35 year old male with a history of cyclic vomiting  syndrome who presents with a 6 hour history of nausea, vomiting with generalized abdominal cramps. EMS gave an intramuscular injection of antiemetic. Patient continues to vomit; will administer IV Zofran and initiate IV fluid resuscitation.  ----------------------------------------- 2:22 AM on 05/16/2015 -----------------------------------------  Patient continues to dry heave after Zofran. Will try Reglan and Benadryl.  ----------------------------------------- 3:07 AM on 05/16/2015 -----------------------------------------  Patient vomited. Valium ordered for abdominal cramps.  ----------------------------------------- 5:41 AM on 05/16/2015 -----------------------------------------  Patient with shaking chills. Rectal temperature afebrile. Patient urinated in the toilet instead of the cup; will attempt another specimen. Compazine and Benadryl ordered. Discussed with hospitalist to evaluate in the emergency department for intractable nausea and vomiting secondary to cyclic vomiting syndrome. ____________________________________________   FINAL CLINICAL IMPRESSION(S) / ED DIAGNOSES  Final diagnoses:  Intractable cyclical vomiting with nausea      NEW MEDICATIONS STARTED DURING THIS VISIT:  New Prescriptions   No medications on file     Note:  This document was prepared using Dragon voice  recognition software and may include unintentional dictation errors.    Paulette Blanch, MD 05/16/15 [phone]

## 2015-05-16 NOTE — Care Management (Signed)
Patient admitted for intractable nausea and vomiting.  Patient states that he is employed, however is uninsured. Patient lives in [place].  Provided with application to medication management and [facility]. RNCM following for discharge medications.

## 2015-05-16 NOTE — Progress Notes (Signed)
Initial Nutrition Assessment      INTERVENTION:  Monitor intake and cater to pt preferences If unable to meet nutritional needs will add supplement   NUTRITION DIAGNOSIS:   Inadequate oral intake related to altered GI function as evidenced by  (nausea, vomiting for the past 2 days).    GOAL:   Patient will meet greater than or equal to 90% of their needs    MONITOR:   PO intake  REASON FOR ASSESSMENT:   Malnutrition Screening Tool    ASSESSMENT:   35 y/o male admited with nausea, vomiting.   Past Medical History  Diagnosis Date  . GERD (gastroesophageal reflux disease)   . Cyclical vomiting syndrome     Pt reports poor po intake for the past 2 days prior to admission secondary to nausea, vomiting. Drank liquids off breakfast tray (ie juice and milk) nothing else eaten this am  Medications reviewed NS with KCL at 138ml/hr Labs reviewed: glucose 156  Nutrition-Focused physical exam completed. Findings are WDL for fat depletion, muscle depletion, and edema.    Diet Order:  Diet regular Room service appropriate?: Yes; Fluid consistency:: Thin  Skin:  Reviewed, no issues  Last BM:  PTA  Height:   Ht Readings from Last 1 Encounters:  05/16/15 6' (1.829 m)    Weight: no wt loss per pt.  Per wt encounters steady wt gain  Wt Readings from Last 1 Encounters:  05/16/15 145 lb (65.772 kg)   Wt Readings from Last 10 Encounters:  05/16/15 145 lb (65.772 kg)  08/09/14 140 lb 8 oz (63.73 kg)  06/22/14 139 lb 14.4 oz (63.458     Ideal Body Weight:     BMI:  Body mass index is 19.66 kg/(m^2).  Estimated Nutritional Needs:   Kcal:  [id number] kcals/d.   Protein:  97-113 g/d  Fluid:  >/= 2L/d  EDUCATION NEEDS:   No education needs identified at this time  Sheila Gervasi B. Zenia Resides, [place], [place] (pager) Weekend/On-Call pager [phone])

## 2015-05-16 NOTE — H&P (Signed)
[place] at [facility] NAME: Dakota Bowen    MR#:  [id number]  DATE OF BIRTH:  Nov 13, 1980  DATE OF ADMISSION:  05/16/2015  PRIMARY CARE PHYSICIAN: No PCP Per Patient   REQUESTING/REFERRING PHYSICIAN:   CHIEF COMPLAINT:   Chief Complaint  Patient presents with  . Emesis    HISTORY OF PRESENT ILLNESS: Dakota Bowen  is a 35 y.o. male with a known history of cyclical vomiting syndrome, GERD presented to the emergency room with nausea and vomiting. Nausea and vomiting has been worse since yesterday. Vomitus contained food and water. No history of any hematemesis hemoptysis. Patient also complains of abdominal discomfort which is aching in nature. The abdominal pain is generalized and is 4 out of 10 on a scale of 1-10. No history of fever or chills. No history of chest pain. No complaints of shortness of breath. No history of travel or sick contacts at home.  PAST MEDICAL HISTORY:   Past Medical History  Diagnosis Date  . GERD (gastroesophageal reflux disease)   . Cyclical vomiting syndrome     PAST SURGICAL HISTORY: Past Surgical History  Procedure Laterality Date  . Esophagogastroduodenoscopy (egd) with propofol N/A 06/21/2014    Procedure: ESOPHAGOGASTRODUODENOSCOPY (EGD) WITH PROPOFOL. Looking in the esophagus stomach and upper small intestine with a lighted tube to examine and treat.;  Surgeon: Lollie Sails, MD;  Location: [facility] ENDOSCOPY;  Service: Endoscopy;  Laterality: N/A;    SOCIAL HISTORY:  Social History  Substance Use Topics  . Smoking status: Current Every Day Smoker -- 1.50 packs/day  . Smokeless tobacco: Not on file  . Alcohol Use: Yes     Comment: 6-7 beers a week    FAMILY HISTORY:  Family History  Problem Relation Age of Onset  . Diabetes Mother   . Diabetes Father     DRUG ALLERGIES: Penicillin   REVIEW OF SYSTEMS:   CONSTITUTIONAL: No fever, has weakness.  EYES: No blurred or double vision.   EARS, NOSE, AND THROAT: No tinnitus or ear pain.  RESPIRATORY: No cough, shortness of breath, wheezing or hemoptysis.  CARDIOVASCULAR: No chest pain, orthopnea, edema.  GASTROINTESTINAL: Has nausea, vomiting, and abdominal pain.  GENITOURINARY: No dysuria, hematuria.  ENDOCRINE: No polyuria, nocturia,  HEMATOLOGY: No anemia, easy bruising or bleeding SKIN: No rash or lesion. MUSCULOSKELETAL: No joint pain or arthritis.   NEUROLOGIC: No tingling, numbness, weakness.  PSYCHIATRY: No anxiety or depression.   MEDICATIONS AT HOME:  Prior to Admission medications   Not on File      PHYSICAL EXAMINATION:   VITAL SIGNS: Blood pressure 112/76, pulse 64, temperature 98.9 F (37.2 C), temperature source Rectal, resp. rate 19, height 6' (1.829 m), weight 65.772 kg (145 lb), SpO2 100 %.  GENERAL:  35 y.o.-year-old patient lying in the bed with no acute distress.  EYES: Pupils equal, round, reactive to light and accommodation. No scleral icterus. Extraocular muscles intact.  HEENT: Head atraumatic, normocephalic. Oropharynx and nasopharynx clear.  NECK:  Supple, no jugular venous distention. No thyroid enlargement, no tenderness.  LUNGS: Normal breath sounds bilaterally, no wheezing, rales,rhonchi or crepitation. No use of accessory muscles of respiration.  CARDIOVASCULAR: S1, S2 normal. No murmurs, rubs, or gallops.  ABDOMEN: Soft, tenderness around umbilicus, nondistended. Bowel sounds present. No organomegaly or mass.  EXTREMITIES: No pedal edema, cyanosis, or clubbing.  NEUROLOGIC: Cranial nerves II through XII are intact. Muscle strength 5/5 in all extremities. Sensation intact. Gait normal. PSYCHIATRIC: The  patient is alert and oriented x 3.  SKIN: No obvious rash, lesion, or ulcer.   LABORATORY PANEL:   CBC  Recent Labs Lab 05/16/15 0055  WBC 13.3*  HGB 16.3  HCT 48.3  PLT 295  MCV 91.9  MCH 31.0  MCHC 33.7  RDW 13.7    ------------------------------------------------------------------------------------------------------------------  Chemistries   Recent Labs Lab 05/16/15 0055  NA 141  K 4.3  CL 101  CO2 25  GLUCOSE 156*  BUN 13  CREATININE 1.03  CALCIUM 10.1  AST 27  ALT 22  ALKPHOS 62  BILITOT 0.6   ------------------------------------------------------------------------------------------------------------------ estimated creatinine clearance is 92.3 mL/min (by C-G formula based on Cr of 1.03). ------------------------------------------------------------------------------------------------------------------ No results for input(s): TSH, T4TOTAL, T3FREE, THYROIDAB in the last 72 hours.  Invalid input(s): FREET3   Coagulation profile No results for input(s): INR, PROTIME in the last 168 hours. ------------------------------------------------------------------------------------------------------------------- No results for input(s): DDIMER in the last 72 hours. -------------------------------------------------------------------------------------------------------------------  Cardiac Enzymes No results for input(s): CKMB, TROPONINI, MYOGLOBIN in the last 168 hours.  Invalid input(s): CK ------------------------------------------------------------------------------------------------------------------ Invalid input(s): POCBNP  ---------------------------------------------------------------------------------------------------------------  Urinalysis    Component Value Date/Time   COLORURINE YELLOW* 08/09/2014 1957   COLORURINE Yellow 09/23/2013 1902   APPEARANCEUR TURBID* 08/09/2014 1957   APPEARANCEUR Clear 09/23/2013 1902   LABSPEC 1.022 08/09/2014 1957   LABSPEC 1.024 09/23/2013 1902   PHURINE 9.0* 08/09/2014 1957   PHURINE 6.0 09/23/2013 1902   GLUCOSEU >500* 08/09/2014 1957   GLUCOSEU 150 mg/dL 09/23/2013 1902   HGBUR NEGATIVE 08/09/2014 1957   HGBUR Negative 09/23/2013  [place] NEGATIVE 08/09/2014 1957   BILIRUBINUR Negative 09/23/2013 1902   KETONESUR 2+* 08/09/2014 1957   KETONESUR 2+ 09/23/2013 1902   PROTEINUR 100* 08/09/2014 1957   PROTEINUR 30 mg/dL 09/23/2013 1902   NITRITE NEGATIVE 08/09/2014 1957   NITRITE Negative 09/23/2013 1902   LEUKOCYTESUR NEGATIVE 08/09/2014 1957   LEUKOCYTESUR Negative 09/23/2013 1902     RADIOLOGY: No results found.  EKG: Orders placed or performed during the hospital encounter of 06/15/14  . ED EKG  . ED EKG  . EKG 12-Lead  . EKG 12-Lead  . EKG    IMPRESSION AND PLAN: 35 year old male patient with history of cyclical vomiting syndrome presented with nausea and vomiting. Admitting diagnosis 1. Intractable nausea and vomiting 2. Cyclical vomiting syndrome 3. Abdominal discomfort 4. Leukocytosis which could be secondary to dehydration Treatment plan Admit patient to medical floor observation bed IV fluid hydration with potassium supplementation Pain management with IV morphine for abdominal pain Antiemetics Follow-up electrolytes Follow up wbc count DVT prophylaxis with subcutaneous Lovenox 40 MG daily.  All the records are reviewed and case discussed with ED provider. Management plans discussed with the patient, family and they are in agreement.  CODE STATUS:FULL Code Status History    Date Active Date Inactive Code Status Order ID Comments User Context   08/09/2014 11:57 PM 08/13/2014  6:17 PM Full Code [id number]  Nicholes Mango, MD Inpatient   06/15/2014  2:02 PM 06/22/2014  9:57 PM Full Code [id number]  Hillary Bow, MD ED       TOTAL TIME TAKING CARE OF THIS PATIENT: 50 minutes.    Saundra Shelling M.D on 05/16/2015 at 6:07 AM  Between 7am to 6pm - Pager - [phone]  After 6pm go to [email or web address] - password [email or web address] [facility]  [place] [facility]  Office  [phone]  CC: Primary care physician; No PCP Per Patient

## 2015-05-16 NOTE — Progress Notes (Signed)
[place] [facility] [place] NAME: Dakota Bowen    MR#:  [id number]  DATE OF BIRTH:  1980/09/23  SUBJECTIVE:  CHIEF COMPLAINT:   Chief Complaint  Patient presents with  . Emesis  Patient is a 35 year old Caucasian male with past medical history significant for history of cyclic vomiting syndrome, gastroesophageal reflux disease who presents to the hospital with complaints of nausea and vomiting since one day before admission. No hematemesis was noted. Patient also complains of abdominal discomfort which is aching in the left lower quadrant, admits of constipation since 3 days ago. No fevers. Feels somewhat improved since admission  Review of Systems  Constitutional: Positive for malaise/fatigue. Negative for fever, chills and weight loss.  HENT: Negative for congestion.   Eyes: Negative for blurred vision and double vision.  Respiratory: Negative for cough, sputum production, shortness of breath and wheezing.   Cardiovascular: Negative for chest pain, palpitations, orthopnea, leg swelling and PND.  Gastrointestinal: Positive for nausea, vomiting, abdominal pain and constipation. Negative for diarrhea and blood in stool.  Genitourinary: Negative for dysuria, urgency, frequency and hematuria.  Musculoskeletal: Negative for falls.  Neurological: Negative for dizziness, tremors, focal weakness and headaches.  Endo/Heme/Allergies: Does not bruise/bleed easily.  Psychiatric/Behavioral: Negative for depression. The patient does not have insomnia.     VITAL SIGNS: Blood pressure 111/70, pulse 72, temperature 98.9 F (37.2 C), temperature source Rectal, resp. rate 16, height 6' (1.829 m), weight 65.772 kg (145 lb), SpO2 100 %.  PHYSICAL EXAMINATION:   GENERAL:  35 y.o.-year-old patient lying in the bed with no acute distress. Pale, chronically ill appearance, sunken eyes  EYES: Pupils equal, round, reactive to light and accommodation. No scleral  icterus. Extraocular muscles intact.  HEENT: Head atraumatic, normocephalic. Oropharynx and nasopharynx clear.  NECK:  Supple, no jugular venous distention. No thyroid enlargement, no tenderness.  LUNGS: Normal breath sounds bilaterally, no wheezing, rales,rhonchi or crepitation. No use of accessory muscles of respiration.  CARDIOVASCULAR: S1, S2 normal. No murmurs, rubs, or gallops.  ABDOMEN: Soft, very uncomfortable diffusely, mostly left lower quadrant but no rebound or guarding was noted, nondistended. Bowel sounds present. No organomegaly or mass.  EXTREMITIES: No pedal edema, cyanosis, or clubbing.  NEUROLOGIC: Cranial nerves II through XII are intact. Muscle strength 5/5 in all extremities. Sensation intact. Gait not checked.  PSYCHIATRIC: The patient is alert and oriented x 3.  SKIN: No obvious rash, lesion, or ulcer.   ORDERS/RESULTS REVIEWED:   CBC  Recent Labs Lab 05/16/15 0055  WBC 13.3*  HGB 16.3  HCT 48.3  PLT 295  MCV 91.9  MCH 31.0  MCHC 33.7  RDW 13.7   ------------------------------------------------------------------------------------------------------------------  Chemistries   Recent Labs Lab 05/16/15 0055  NA 141  K 4.3  CL 101  CO2 25  GLUCOSE 156*  BUN 13  CREATININE 1.03  CALCIUM 10.1  AST 27  ALT 22  ALKPHOS 62  BILITOT 0.6   ------------------------------------------------------------------------------------------------------------------ estimated creatinine clearance is 92.3 mL/min (by C-G formula based on Cr of 1.03). ------------------------------------------------------------------------------------------------------------------ No results for input(s): TSH, T4TOTAL, T3FREE, THYROIDAB in the last 72 hours.  Invalid input(s): FREET3  Cardiac Enzymes No results for input(s): CKMB, TROPONINI, MYOGLOBIN in the last 168 hours.  Invalid input(s):  CK ------------------------------------------------------------------------------------------------------------------ Invalid input(s): POCBNP ---------------------------------------------------------------------------------------------------------------  RADIOLOGY: No results found.  EKG:  Orders placed or performed during the hospital encounter of 06/15/14  . ED EKG  . ED EKG  . EKG 12-Lead  . EKG  12-Lead  . EKG    ASSESSMENT AND PLAN:  Active Problems:   Nausea & vomiting #1. Cyclic nausea and vomiting, continue patient on IV fluids, change diet to clear liquid diet to be advanced as tolerated, continue antiemetics with Zofran and Compazine, Reglan #2. Leukocytosis, likely stress related, following the morning, not on antibiotics #3. Hyperglycemia, get a hemoglobin A1c #4. Left lower quadrant abdominal pain, likely due to constipation, give patient one enema, follow tomorrow morning clinically  Management plans discussed with the patient, family and they are in agreement.   DRUG ALLERGIES:   CODE STATUS:     Code Status Orders        Start     Ordered   05/16/15 0803  Full code   Continuous     05/16/15 0802    Code Status History    Date Active Date Inactive Code Status Order ID Comments User Context   08/09/2014 11:57 PM 08/13/2014  6:17 PM Full Code [id number]  Nicholes Mango, MD Inpatient   06/15/2014  2:02 PM 06/22/2014  9:57 PM Full Code [id number]  Hillary Bow, MD ED      TOTAL TIME TAKING CARE OF THIS PATIENT: 40 minutes.    Theodoro Grist M.D on 05/16/2015 at 11:30 AM  Between 7am to 6pm - Pager - [phone]  After 6pm go to [email or web address] - password [email or web address] [facility]  [facility]  Office  [phone]  CC: Primary care physician; No PCP Per Patient

## 2015-05-16 NOTE — ED Notes (Signed)
Patient actively vomiting again. Provider notified.

## 2015-05-16 NOTE — ED Notes (Signed)
Patient reports nausea and vomiting for the past 6 hours.  Patient actively vomiting in triage.

## 2015-05-17 ENCOUNTER — Observation Stay: Payer: Self-pay

## 2015-05-17 LAB — CBC
HCT: 43.8 % (ref 40.0–52.0)
Hemoglobin: 15 g/dL (ref 13.0–18.0)
MCH: 32 pg (ref 26.0–34.0)
MCHC: 34.2 g/dL (ref 32.0–36.0)
MCV: 93.5 fL (ref 80.0–100.0)
PLATELETS: 217 10*3/uL (ref 150–440)
RBC: 4.69 MIL/uL (ref 4.40–5.90)
RDW: 13.3 % (ref 11.5–14.5)
WBC: 7.7 10*3/uL (ref 3.8–10.6)

## 2015-05-17 LAB — BASIC METABOLIC PANEL
Anion gap: 6 (ref 5–15)
BUN: 7 mg/dL (ref 6–20)
CALCIUM: 8.6 mg/dL — AB (ref 8.9–10.3)
CO2: 24 mmol/L (ref 22–32)
CREATININE: 0.85 mg/dL (ref 0.61–1.24)
Chloride: 111 mmol/L (ref 101–111)
GFR calc non Af Amer: >60 mL/min (ref >60)
Glucose, Bld: 96 mg/dL (ref 65–99)
Potassium: 3.6 mmol/L (ref 3.5–5.1)
SODIUM: 141 mmol/L (ref 135–145)

## 2015-05-17 MED ORDER — DOCUSATE SODIUM 100 MG PO CAPS
100.0000 mg | ORAL_CAPSULE | Freq: Two times a day (BID) | ORAL | Status: DC
  Administered 2015-05-17 – 2015-05-19 (×5): 100 mg via ORAL
  Filled 2015-05-17 (×5): qty 1

## 2015-05-17 MED ORDER — SENNA 8.6 MG PO TABS
1.0000 | ORAL_TABLET | Freq: Every day | ORAL | Status: DC
  Administered 2015-05-17 – 2015-05-19 (×3): 8.6 mg via ORAL
  Filled 2015-05-17 (×3): qty 1

## 2015-05-17 NOTE — Progress Notes (Signed)
Antiemetics effective; tolerating coffee; increased diet to full liquids for breakfast; Barbaraann Faster, RN 5:59 AM 05/17/2015

## 2015-05-17 NOTE — Progress Notes (Signed)
Received report, patient resting in the bed, alert and oriented, c/o pain in abdomen, morphine 2mg  administer via iv will continue to monitor

## 2015-05-17 NOTE — Progress Notes (Signed)
[place] [facility] [place] NAME: Dakota Bowen    MR#:  [id number]  DATE OF BIRTH:  1980/11/20  SUBJECTIVE:  CHIEF COMPLAINT:   Chief Complaint  Patient presents with  . Emesis  Patient is a 35 year old Caucasian male with past medical history significant for history of cyclic vomiting syndrome, gastroesophageal reflux disease who presents to the hospital with complaints of nausea and vomiting since one day before admission. No hematemesis was noted. Patient also complains of abdominal discomfort which is aching in the left lower quadrant, admits of constipation since 3 days ago. No fevers. Patient had an enema yesterday with minimal stool, remains nauseated intermittently with some abdominal pain, dry heaving, was not able to tolerate clear liquid diet  Review of Systems  Constitutional: Positive for malaise/fatigue. Negative for fever, chills and weight loss.  HENT: Negative for congestion.   Eyes: Negative for blurred vision and double vision.  Respiratory: Negative for cough, sputum production, shortness of breath and wheezing.   Cardiovascular: Negative for chest pain, palpitations, orthopnea, leg swelling and PND.  Gastrointestinal: Positive for nausea, vomiting and abdominal pain. Negative for diarrhea and blood in stool.  Genitourinary: Negative for dysuria, urgency, frequency and hematuria.  Musculoskeletal: Negative for falls.  Neurological: Negative for dizziness, tremors, focal weakness and headaches.  Endo/Heme/Allergies: Does not bruise/bleed easily.  Psychiatric/Behavioral: Negative for depression. The patient does not have insomnia.     VITAL SIGNS: Blood pressure 111/81, pulse 67, temperature 97.9 F (36.6 C), temperature source Oral, resp. rate 20, height 6' (1.829 m), weight 62 kg (136 lb 11 oz), SpO2 98 %.  PHYSICAL EXAMINATION:   GENERAL:  35 y.o.-year-old patient lying in the bed with no acute distress. A little,  however, more animated today and smiling, appears a little bit more comfortable  EYES: Pupils equal, round, reactive to light and accommodation. No scleral icterus. Extraocular muscles intact.  HEENT: Head atraumatic, normocephalic. Oropharynx and nasopharynx clear.  NECK:  Supple, no jugular venous distention. No thyroid enlargement, no tenderness.  LUNGS: Normal breath sounds bilaterally, no wheezing, rales,rhonchi or crepitation. No use of accessory muscles of respiration.  CARDIOVASCULAR: S1, S2 normal. No murmurs, rubs, or gallops.  ABDOMEN: Soft, not painful, nondistended. Bowel sounds present. No organomegaly or mass.  EXTREMITIES: No pedal edema, cyanosis, or clubbing.  NEUROLOGIC: Cranial nerves II through XII are intact. Muscle strength 5/5 in all extremities. Sensation intact. Gait not checked.  PSYCHIATRIC: The patient is alert and oriented x 3.  SKIN: No obvious rash, lesion, or ulcer.   ORDERS/RESULTS REVIEWED:   CBC  Recent Labs Lab 05/16/15 0055 05/17/15 0425  WBC 13.3* 7.7  HGB 16.3 15.0  HCT 48.3 43.8  PLT 295 217  MCV 91.9 93.5  MCH 31.0 32.0  MCHC 33.7 34.2  RDW 13.7 13.3   ------------------------------------------------------------------------------------------------------------------  Chemistries   Recent Labs Lab 05/16/15 0055 05/17/15 0425  NA 141 141  K 4.3 3.6  CL 101 111  CO2 25 24  GLUCOSE 156* 96  BUN 13 7  CREATININE 1.03 0.85  CALCIUM 10.1 8.6*  AST 27  --   ALT 22  --   ALKPHOS 62  --   BILITOT 0.6  --    ------------------------------------------------------------------------------------------------------------------ estimated creatinine clearance is 105.4 mL/min (by C-G formula based on Cr of 0.85). ------------------------------------------------------------------------------------------------------------------ No results for input(s): TSH, T4TOTAL, T3FREE, THYROIDAB in the last 72 hours.  Invalid input(s): FREET3  Cardiac  Enzymes No results for input(s): CKMB,  TROPONINI, MYOGLOBIN in the last 168 hours.  Invalid input(s): CK ------------------------------------------------------------------------------------------------------------------ Invalid input(s): POCBNP ---------------------------------------------------------------------------------------------------------------  RADIOLOGY: Dg Abd 2 Views  05/17/2015  CLINICAL DATA:  Nausea and vomiting for 2 days.  Pain. EXAM: ABDOMEN - 2 VIEW COMPARISON:  CT abdomen and pelvis Jun 15, 2014 FINDINGS: Supine and upright images were obtained. There is mild stool throughout the colon. There is no bowel dilatation or air-fluid level suggesting obstruction. No free air. There is a small phlebolith in the left pelvis. Visualized lung bases are clear. Surgical clip noted in right upper quadrant. IMPRESSION: No demonstrable bowel obstruction or free air. Visualized lung bases are clear. Electronically Signed   By: Lowella Grip III M.D.   On: 05/17/2015 09:09    EKG:  Orders placed or performed during the hospital encounter of 06/15/14  . ED EKG  . ED EKG  . EKG 12-Lead  . EKG 12-Lead  . EKG    ASSESSMENT AND PLAN:  Active Problems:   Nausea & vomiting #1. Cyclic nausea and vomiting, continue patient on IV fluids, still not able to tolerate any diet, continue clear liquid diet to be advanced as tolerated, continue antiemetics with Zofran and Compazine, Reglan #2. Leukocytosis, likely stress related, resolved with conservative therapy #3. Hyperglycemia,  hemoglobin A1c was 5.2, no diabetes #4. Left lower quadrant abdominal pain, due to constipation, patient was given enema with some stool production, pain resolved #5, constipation, initiate patient on Colace, Senokot Management plans discussed with the patient, family and they are in agreement.   DRUG ALLERGIES:   CODE STATUS:     Code Status Orders        Start     Ordered   05/16/15 0803  Full code    Continuous     05/16/15 0802    Code Status History    Date Active Date Inactive Code Status Order ID Comments User Context   08/09/2014 11:57 PM 08/13/2014  6:17 PM Full Code [id number]  Nicholes Mango, MD Inpatient   06/15/2014  2:02 PM 06/22/2014  9:57 PM Full Code [id number]  Hillary Bow, MD ED      TOTAL TIME TAKING CARE OF THIS PATIENT: 35 minutes.  Emotional support was provided  [facility] M.D on 05/17/2015 at 11:56 AM  Between 7am to 6pm - Pager - [phone]  After 6pm go to [email or web address] - password [email or web address] [facility]  [facility]  Office  [phone]  CC: Primary care physician; No PCP Per Patient

## 2015-05-18 LAB — COMPREHENSIVE METABOLIC PANEL
ALT: 77 U/L — ABNORMAL HIGH (ref 17–63)
ANION GAP: 7 (ref 5–15)
AST: 50 U/L — ABNORMAL HIGH (ref 15–41)
Albumin: 3.9 g/dL (ref 3.5–5.0)
Alkaline Phosphatase: 57 U/L (ref 38–126)
BILIRUBIN TOTAL: 0.8 mg/dL (ref 0.3–1.2)
BUN: 7 mg/dL (ref 6–20)
CALCIUM: 8.8 mg/dL — AB (ref 8.9–10.3)
CHLORIDE: 103 mmol/L (ref 101–111)
CO2: 27 mmol/L (ref 22–32)
Creatinine, Ser: 0.91 mg/dL (ref 0.61–1.24)
Glucose, Bld: 82 mg/dL (ref 65–99)
POTASSIUM: 4 mmol/L (ref 3.5–5.1)
Sodium: 137 mmol/L (ref 135–145)
TOTAL PROTEIN: 6 g/dL — AB (ref 6.5–8.1)

## 2015-05-18 MED ORDER — METOCLOPRAMIDE HCL 5 MG/ML IJ SOLN
10.0000 mg | Freq: Four times a day (QID) | INTRAMUSCULAR | Status: DC
  Administered 2015-05-18 – 2015-05-19 (×4): 10 mg via INTRAVENOUS
  Filled 2015-05-18 (×4): qty 2

## 2015-05-18 MED ORDER — PANTOPRAZOLE SODIUM 40 MG PO TBEC
40.0000 mg | DELAYED_RELEASE_TABLET | Freq: Every day | ORAL | Status: DC
  Administered 2015-05-18: 40 mg via ORAL
  Filled 2015-05-18: qty 1

## 2015-05-18 MED ORDER — PANTOPRAZOLE SODIUM 40 MG PO TBEC
40.0000 mg | DELAYED_RELEASE_TABLET | Freq: Two times a day (BID) | ORAL | Status: DC
  Administered 2015-05-18 – 2015-05-19 (×2): 40 mg via ORAL
  Filled 2015-05-18 (×2): qty 1

## 2015-05-18 MED ORDER — SUCRALFATE 1 GM/10ML PO SUSP
1.0000 g | Freq: Four times a day (QID) | ORAL | Status: DC
  Administered 2015-05-18 – 2015-05-19 (×4): 1 g via ORAL
  Filled 2015-05-18 (×4): qty 10

## 2015-05-18 MED ORDER — TRAMADOL-ACETAMINOPHEN 37.5-325 MG PO TABS
1.0000 | ORAL_TABLET | ORAL | Status: DC | PRN
  Administered 2015-05-19: 1 via ORAL
  Filled 2015-05-18: qty 1

## 2015-05-18 NOTE — Progress Notes (Signed)
Tolerated full liquids this shift (2 cans sprite) without nausea or vomiting.  Continues to complain of abdominal pain.  Sleeping after IV Morphine given.

## 2015-05-18 NOTE — Progress Notes (Signed)
[place] at [place] NAME: Dakota Bowen    MR#:  [id number]  DATE OF BIRTH:  1980/12/06  SUBJECTIVE:  CHIEF COMPLAINT:   Chief Complaint  Patient presents with  . Emesis  Patient is a 35 year old Caucasian male with past medical history significant for history of cyclic vomiting syndrome, gastroesophageal reflux disease who presents to the hospital with complaints of nausea and vomiting since one day before admission. No hematemesis was noted. Patient still complains of abdominal pain, epigastric area, intermittent nausea. Still not able to eat or drink fluids. He was initiated on Protonix and Carafate today, he is being continued on IV fluids since admission   Review of Systems  Constitutional: Positive for malaise/fatigue. Negative for fever, chills and weight loss.  HENT: Negative for congestion.   Eyes: Negative for blurred vision and double vision.  Respiratory: Negative for cough, sputum production, shortness of breath and wheezing.   Cardiovascular: Negative for chest pain, palpitations, orthopnea, leg swelling and PND.  Gastrointestinal: Positive for nausea, vomiting and abdominal pain. Negative for diarrhea and blood in stool.  Genitourinary: Negative for dysuria, urgency, frequency and hematuria.  Musculoskeletal: Negative for falls.  Neurological: Negative for dizziness, tremors, focal weakness and headaches.  Endo/Heme/Allergies: Does not bruise/bleed easily.  Psychiatric/Behavioral: Negative for depression. The patient does not have insomnia.     VITAL SIGNS: Blood pressure 168/108, pulse 69, temperature 97.6 F (36.4 C), temperature source Oral, resp. rate 18, height 6' (1.829 m), weight 62 kg (136 lb 11 oz), SpO2 100 %.  PHYSICAL EXAMINATION:   GENERAL:  35 y.o.-year-old patient lying in the bed with no acute distress. Pale grayish complexion. EYES: Pupils equal, round, reactive to light and accommodation. No  scleral icterus. Extraocular muscles intact.  HEENT: Head atraumatic, normocephalic. Oropharynx and nasopharynx clear.  NECK:  Supple, no jugular venous distention. No thyroid enlargement, no tenderness.  LUNGS: Normal breath sounds bilaterally, no wheezing, rales,rhonchi or crepitation. No use of accessory muscles of respiration.  CARDIOVASCULAR: S1, S2 normal. No murmurs, rubs, or gallops.  ABDOMEN: Soft,  mild discomfort in epigastric area but no rebound or guarding was noted nondistended. Bowel sounds present. No organomegaly or mass.  EXTREMITIES: No pedal edema, cyanosis, or clubbing.  NEUROLOGIC: Cranial nerves II through XII are intact. Muscle strength 5/5 in all extremities. Sensation intact. Gait not checked.  PSYCHIATRIC: The patient is alert and oriented x 3.  SKIN: No obvious rash, lesion, or ulcer.   ORDERS/RESULTS REVIEWED:   CBC  Recent Labs Lab 05/16/15 0055 05/17/15 0425  WBC 13.3* 7.7  HGB 16.3 15.0  HCT 48.3 43.8  PLT 295 217  MCV 91.9 93.5  MCH 31.0 32.0  MCHC 33.7 34.2  RDW 13.7 13.3   ------------------------------------------------------------------------------------------------------------------  Chemistries   Recent Labs Lab 05/16/15 0055 05/17/15 0425  NA 141 141  K 4.3 3.6  CL 101 111  CO2 25 24  GLUCOSE 156* 96  BUN 13 7  CREATININE 1.03 0.85  CALCIUM 10.1 8.6*  AST 27  --   ALT 22  --   ALKPHOS 62  --   BILITOT 0.6  --    ------------------------------------------------------------------------------------------------------------------ estimated creatinine clearance is 105.4 mL/min (by C-G formula based on Cr of 0.85). ------------------------------------------------------------------------------------------------------------------ No results for input(s): TSH, T4TOTAL, T3FREE, THYROIDAB in the last 72 hours.  Invalid input(s): FREET3  Cardiac Enzymes No results for input(s): CKMB, TROPONINI, MYOGLOBIN in the last 168  hours.  Invalid input(s): CK ------------------------------------------------------------------------------------------------------------------ Invalid  input(s): POCBNP ---------------------------------------------------------------------------------------------------------------  RADIOLOGY: Dg Abd 2 Views  05/17/2015  CLINICAL DATA:  Nausea and vomiting for 2 days.  Pain. EXAM: ABDOMEN - 2 VIEW COMPARISON:  CT abdomen and pelvis Jun 15, 2014 FINDINGS: Supine and upright images were obtained. There is mild stool throughout the colon. There is no bowel dilatation or air-fluid level suggesting obstruction. No free air. There is a small phlebolith in the left pelvis. Visualized lung bases are clear. Surgical clip noted in right upper quadrant. IMPRESSION: No demonstrable bowel obstruction or free air. Visualized lung bases are clear. Electronically Signed   By: Lowella Grip III M.D.   On: 05/17/2015 09:09    EKG:  Orders placed or performed during the hospital encounter of 06/15/14  . ED EKG  . ED EKG  . EKG 12-Lead  . EKG 12-Lead  . EKG    ASSESSMENT AND PLAN:  Active Problems:   Nausea & vomiting #1. Cyclic nausea and vomiting, continue patient on IV fluids, still not able to tolerate any diet, continue liquid diet to be advanced as tolerated, continue antiemetics with Zofran and Compazine, Reglan. Adding Protonix and Carafate, get gastroenterology consultation if no improvement with current medications by tomorrow  #2. Leukocytosis, likely stress related, resolved with conservative therapy #3. Hyperglycemia,  hemoglobin A1c was 5.2, no diabetes #4. Left lower quadrant abdominal pain, due to constipation, patient was given enema with some stool production, pain resolved #5, constipation,  continue Colace, Senokot Management plans discussed with the patient, family and they are in agreement.   DRUG ALLERGIES:   CODE STATUS:     Code Status Orders        Start     Ordered    05/16/15 0803  Full code   Continuous     05/16/15 0802    Code Status History    Date Active Date Inactive Code Status Order ID Comments User Context   08/09/2014 11:57 PM 08/13/2014  6:17 PM Full Code [id number]  Nicholes Mango, MD Inpatient   06/15/2014  2:02 PM 06/22/2014  9:57 PM Full Code [id number]  Hillary Bow, MD ED      TOTAL TIME TAKING CARE OF THIS PATIENT: 35 minutes.    Theodoro Grist M.D on 05/18/2015 at 1:31 PM  Between 7am to 6pm - Pager - [phone]  After 6pm go to [email or web address] - password [email or web address] [facility]  [facility]  Office  [phone]  CC: Primary care physician; No PCP Per Patient

## 2015-05-19 DIAGNOSIS — K2900 Acute gastritis without bleeding: Secondary | ICD-10-CM

## 2015-05-19 DIAGNOSIS — K5900 Constipation, unspecified: Secondary | ICD-10-CM

## 2015-05-19 DIAGNOSIS — R739 Hyperglycemia, unspecified: Secondary | ICD-10-CM

## 2015-05-19 DIAGNOSIS — D72829 Elevated white blood cell count, unspecified: Secondary | ICD-10-CM

## 2015-05-19 DIAGNOSIS — R1032 Left lower quadrant pain: Secondary | ICD-10-CM

## 2015-05-19 LAB — LIPASE, BLOOD: Lipase: 25 U/L (ref 11–51)

## 2015-05-19 MED ORDER — ONDANSETRON HCL 4 MG PO TABS: 4.0000 mg | ORAL_TABLET | Freq: Four times a day (QID) | ORAL | Status: DC | PRN

## 2015-05-19 MED ORDER — PANTOPRAZOLE SODIUM 40 MG PO TBEC: 40.0000 mg | DELAYED_RELEASE_TABLET | Freq: Every day | ORAL | Status: DC

## 2015-05-19 MED ORDER — SUCRALFATE 1 G PO TABS: 1.0000 g | ORAL_TABLET | Freq: Three times a day (TID) | ORAL | Status: DC

## 2015-05-19 MED ORDER — TRAMADOL-ACETAMINOPHEN 37.5-325 MG PO TABS: 1.0000 | ORAL_TABLET | ORAL | Status: DC | PRN

## 2015-05-19 NOTE — Discharge Summary (Signed)
[facility] NAME: Dakota Bowen    MR#:  [id number]  DATE OF BIRTH:  1980-07-23  DATE OF ADMISSION:  05/16/2015 ADMITTING PHYSICIAN: Saundra Shelling, MD  DATE OF DISCHARGE: 05/19/2015 11:50 AM  PRIMARY CARE PHYSICIAN: No PCP Per Patient     ADMISSION DIAGNOSIS:  Intractable cyclical vomiting with nausea [G43.A1]  DISCHARGE DIAGNOSIS:  Principal Problem:   Acute gastritis Active Problems:   Nausea & vomiting   Leukocytosis   Hyperglycemia   LLQ pain   Constipation   SECONDARY DIAGNOSIS:   Past Medical History  Diagnosis Date  . GERD (gastroesophageal reflux disease)   . Cyclical vomiting syndrome     .pro HOSPITAL COURSE:  Patient is a 35 year old Caucasian male with past medical history significant for history of cyclic vomiting syndrome, gastroesophageal reflux disease who presents to the hospital with complaints of nausea and vomiting since one day before admission. No hematemesis was noted. Patient Complained of abdominal pain, epigastric area, intermittent nausea.  He was initiated on Protonix and Carafate , IV fluids and gradually improved. On the day of discharge she was able to eat soft diet with no significant discomfort or vomiting. He was felt to be stable to be discharged home. He was advised to follow-up with primary care physician and gastroenterologist as outpatient for possible EGD and to rule out peptic ulcer disease.  Discussion by problem: #1. Acute gastritis with nausea, vomiting and epigastric abdominal pain, etiology is unclear, improved on Carafate and Protonix, patient was advised to continue current medications and follow-up was gastroenterologist for possible EGD as outpatient #2. Cyclic nausea, vomiting, patient was continued on IV fluids while in the hospital, supportive therapy, improved with conservative management, was ready to be discharged on 05/19/2015 #3. Leukocytosis, likely stress  related, resolved with conservative therapy, no infection was noted #4. Hyperglycemia, hemoglobin A1c was 5.2, no diabetes #5. Left lower quadrant abdominal pain, likely constipation, patient was initiated on Colace and Senokot, given an enema with some salt production and pain resolved, patient was advised to continue stool softeners/stimulants if needed   DISCHARGE CONDITIONS:   Stable  CONSULTS OBTAINED:     DRUG ALLERGIES:     DISCHARGE MEDICATIONS:   Discharge Medication List as of 05/19/2015 10:41 AM    CONTINUE these medications which have CHANGED   Details  ondansetron (ZOFRAN) 4 MG tablet Take 1 tablet (4 mg total) by mouth every 6 (six) hours as needed for nausea., Starting 05/19/2015, Until Discontinued, Print    pantoprazole (PROTONIX) 40 MG tablet Take 1 tablet (40 mg total) by mouth daily., Starting 05/19/2015, Until Discontinued, Print    sucralfate (CARAFATE) 1 g tablet Take 1 tablet (1 g total) by mouth 4 (four) times daily -  with meals and at bedtime., Starting 05/19/2015, Until Discontinued, Print      STOP taking these medications     traMADol-acetaminophen (ULTRACET) 37.5-325 MG tablet          DISCHARGE INSTRUCTIONS:    Patient is to follow-up with primary care physician and gastroenterologist as outpatient  If you experience worsening of your admission symptoms, develop shortness of breath, life threatening emergency, suicidal or homicidal thoughts you must seek medical attention immediately by calling 911 or calling your MD immediately  if symptoms less severe.  You Must read complete instructions/literature along with all the possible adverse reactions/side effects for all the Medicines you take and that have been prescribed to you. Take any  new Medicines after you have completely understood and accept all the possible adverse reactions/side effects.   Please note  You were cared for by a hospitalist during your hospital stay. If you have any questions  about your discharge medications or the care you received while you were in the hospital after you are discharged, you can call the unit and asked to speak with the hospitalist on call if the hospitalist that took care of you is not available. Once you are discharged, your primary care physician will handle any further medical issues. Please note that NO REFILLS for any discharge medications will be authorized once you are discharged, as it is imperative that you return to your primary care physician (or establish a relationship with a primary care physician if you do not have one) for your aftercare needs so that they can reassess your need for medications and monitor your lab values.    Today   CHIEF COMPLAINT:   Chief Complaint  Patient presents with  . Emesis    HISTORY OF PRESENT ILLNESS:  Dakota Bowen  is a 34 y.o. male with a known history of cyclic vomiting syndrome, gastroesophageal reflux disease who presents to the hospital with complaints of nausea and vomiting since one day before admission. No hematemesis was noted. Patient Complained of abdominal pain, epigastric area, intermittent nausea.  He was initiated on Protonix and Carafate , IV fluids and gradually improved. On the day of discharge she was able to eat soft diet with no significant discomfort or vomiting. He was felt to be stable to be discharged home. He was advised to follow-up with primary care physician and gastroenterologist as outpatient for possible EGD and to rule out peptic ulcer disease.  Discussion by problem: #1. Acute gastritis with nausea, vomiting and epigastric abdominal pain, etiology is unclear, improved on Carafate and Protonix, patient was advised to continue current medications and follow-up was gastroenterologist for possible EGD as outpatient #2. Cyclic nausea, vomiting, patient was continued on IV fluids while in the hospital, supportive therapy, improved with conservative management, was ready to be  discharged on 05/19/2015 #3. Leukocytosis, likely stress related, resolved with conservative therapy, no infection was noted #4. Hyperglycemia, hemoglobin A1c was 5.2, no diabetes #5. Left lower quadrant abdominal pain, likely constipation, patient was initiated on Colace and Senokot, given an enema with some salt production and pain resolved, patient was advised to continue stool softeners/stimulants if needed    VITAL SIGNS:  Blood pressure 109/76, pulse 58, temperature 97.7 F (36.5 C), temperature source Oral, resp. rate 18, height 6' (1.829 m), weight 62 kg (136 lb 11 oz), SpO2 98 %.  I/O:   Intake/Output Summary (Last 24 hours) at 05/19/15 1334 Last data filed at 05/19/15 0900  Gross per 24 hour  Intake   1924 ml  Output    500 ml  Net   1424 ml    PHYSICAL EXAMINATION:  GENERAL:  35 y.o.-year-old patient lying in the bed with no acute distress.  EYES: Pupils equal, round, reactive to light and accommodation. No scleral icterus. Extraocular muscles intact.  HEENT: Head atraumatic, normocephalic. Oropharynx and nasopharynx clear.  NECK:  Supple, no jugular venous distention. No thyroid enlargement, no tenderness.  LUNGS: Normal breath sounds bilaterally, no wheezing, rales,rhonchi or crepitation. No use of accessory muscles of respiration.  CARDIOVASCULAR: S1, S2 normal. No murmurs, rubs, or gallops.  ABDOMEN: Soft, non-tender, non-distended. Bowel sounds present. No organomegaly or mass.  EXTREMITIES: No pedal edema, cyanosis, or clubbing.  NEUROLOGIC: Cranial nerves II through XII are intact. Muscle strength 5/5 in all extremities. Sensation intact. Gait not checked.  PSYCHIATRIC: The patient is alert and oriented x 3.  SKIN: No obvious rash, lesion, or ulcer.   DATA REVIEW:   CBC  Recent Labs Lab 05/17/15 0425  WBC 7.7  HGB 15.0  HCT 43.8  PLT 217    Chemistries   Recent Labs Lab 05/18/15 1930  NA 137  K 4.0  CL 103  CO2 27  GLUCOSE 82  BUN 7   CREATININE 0.91  CALCIUM 8.8*  AST 50*  ALT 77*  ALKPHOS 57  BILITOT 0.8    Cardiac Enzymes No results for input(s): TROPONINI in the last 168 hours.  Microbiology Results  Results for orders placed or performed in visit on 05/16/11  Urine culture     Status: None   Collection Time: 05/16/11  1:37 PM  Result Value Ref Range Status   Micro Text Report   Final       SOURCE: CLEAN CATCH    COMMENT                   MIXED BACTERIAL ORGANISMS   COMMENT                   RESULTS SUGGESTIVE OF CONTAMINATION   COMMENT                   SUGGEST RECOLLECTION   ANTIBIOTIC                                                        RADIOLOGY:  No results found.  EKG:   Orders placed or performed during the hospital encounter of 06/15/14  . ED EKG  . ED EKG  . EKG 12-Lead  . EKG 12-Lead  . EKG      Management plans discussed with the patient, family and they are in agreement.  CODE STATUS:     Code Status Orders        Start     Ordered   05/16/15 0803  Full code   Continuous     05/16/15 0802    Code Status History    Date Active Date Inactive Code Status Order ID Comments User Context   08/09/2014 11:57 PM 08/13/2014  6:17 PM Full Code [id number]  Nicholes Mango, MD Inpatient   06/15/2014  2:02 PM 06/22/2014  9:57 PM Full Code [id number]  Hillary Bow, MD ED      TOTAL TIME TAKING CARE OF THIS PATIENT: 40  minutes.    Theodoro Grist M.D on 05/19/2015 at 1:34 PM  Between 7am to 6pm - Pager - [phone]  After 6pm go to [email or web address] - password [email or web address] [facility]  [facility]  Office  [phone]  CC: Primary care physician; No PCP Per Patient

## 2015-05-19 NOTE — Progress Notes (Signed)
Alert and oriented. Vital signs stable . No signs of acute distress. Discharge instructions given. Patient verbalizes understanding. No other issues noted at this time.   

## 2015-05-19 NOTE — Care Management Note (Signed)
Case Management Note  Patient Details  Name: DAYSEAN DIGGINS MRN: [id number] Date of Birth: 1980-01-18  Subjective/Objective:    35 y.o. M admitted 05/16/2015 for N/V. To be discharged home today and is self pay . CM spoke with pt to assess if he can afford his medications and offer  Assistance as needed. Pt reports that he can afford his medications with the assistance of GoodRx coupons which I have printed and given him. I also gave him applications for [facility] and Medication Management as well as, directions to Medication Management and directions for application. Made MD aware that we need hard copies of scripts as pt aware he will need to present to specific pharmacy. No further care management needs at this time.                 Action/Plan:Will sign off for now. CM will be available should additional needs arise.    Expected Discharge Date:                  Expected Discharge Plan:  Home/Self Care  In-House Referral:     Discharge planning Services  CM Consult, Medication Assistance  Post Acute Care Choice:    Choice offered to:  Patient  DME Arranged:    DME Agency:     HH Arranged:    [place] Agency:     Status of Service:  Completed, signed off  Medicare Important Message Given:    Date Medicare IM Given:    Medicare IM give by:    Date Additional Medicare IM Given:    Additional Medicare Important Message give by:     If discussed at [place] of Stay Meetings, dates discussed:    Additional Comments:  Delrae Sawyers, RN 05/19/2015, 10:30 AM

## 2015-08-15 ENCOUNTER — Emergency Department
Admission: EM | Admit: 2015-08-15 | Discharge: 2015-08-15 | Disposition: A | Discharge status: Home / Self Care | Payer: Self-pay | Attending: Emergency Medicine | Admitting: Emergency Medicine

## 2015-08-15 DIAGNOSIS — F17200 Nicotine dependence, unspecified, uncomplicated: Secondary | ICD-10-CM | POA: Insufficient documentation

## 2015-08-15 DIAGNOSIS — Z79899 Other long term (current) drug therapy: Secondary | ICD-10-CM | POA: Insufficient documentation

## 2015-08-15 DIAGNOSIS — R1115 Cyclical vomiting syndrome unrelated to migraine: Secondary | ICD-10-CM

## 2015-08-15 DIAGNOSIS — G43A0 Cyclical vomiting, not intractable: Secondary | ICD-10-CM | POA: Insufficient documentation

## 2015-08-15 LAB — COMPREHENSIVE METABOLIC PANEL
ALBUMIN: 4.9 g/dL (ref 3.5–5.0)
ALT: 23 U/L (ref 17–63)
AST: 27 U/L (ref 15–41)
Alkaline Phosphatase: 64 U/L (ref 38–126)
Anion gap: 10 (ref 5–15)
BUN: 14 mg/dL (ref 6–20)
CHLORIDE: 103 mmol/L (ref 101–111)
CO2: 26 mmol/L (ref 22–32)
CREATININE: 0.71 mg/dL (ref 0.61–1.24)
Calcium: 9.6 mg/dL (ref 8.9–10.3)
GFR calc Af Amer: >60 mL/min (ref >60)
GLUCOSE: 150 mg/dL — AB (ref 65–99)
POTASSIUM: 4.1 mmol/L (ref 3.5–5.1)
SODIUM: 139 mmol/L (ref 135–145)
Total Bilirubin: 1 mg/dL (ref 0.3–1.2)
Total Protein: 7.6 g/dL (ref 6.5–8.1)

## 2015-08-15 LAB — LIPASE, BLOOD: LIPASE: 16 U/L (ref 11–51)

## 2015-08-15 LAB — CBC
HEMATOCRIT: 45.2 % (ref 40.0–52.0)
Hemoglobin: 15.8 g/dL (ref 13.0–18.0)
MCH: 32.5 pg (ref 26.0–34.0)
MCHC: 34.9 g/dL (ref 32.0–36.0)
MCV: 93.1 fL (ref 80.0–100.0)
PLATELETS: 226 10*3/uL (ref 150–440)
RBC: 4.86 MIL/uL (ref 4.40–5.90)
RDW: 13.9 % (ref 11.5–14.5)
WBC: 10.1 10*3/uL (ref 3.8–10.6)

## 2015-08-15 MED ORDER — LORAZEPAM 2 MG/ML IJ SOLN
1.0000 mg | Freq: Once | INTRAMUSCULAR | Status: AC
  Administered 2015-08-15: 1 mg via INTRAVENOUS
  Filled 2015-08-15: qty 1

## 2015-08-15 MED ORDER — PROMETHAZINE HCL 25 MG PO TABS: 25.0000 mg | ORAL_TABLET | ORAL | 1 refills | Status: DC | PRN

## 2015-08-15 MED ORDER — ONDANSETRON HCL 4 MG/2ML IJ SOLN
INTRAMUSCULAR | Status: AC
  Filled 2015-08-15: qty 2

## 2015-08-15 MED ORDER — METOCLOPRAMIDE HCL 5 MG/ML IJ SOLN
10.0000 mg | Freq: Once | INTRAMUSCULAR | Status: AC
  Administered 2015-08-15: 10 mg via INTRAVENOUS
  Filled 2015-08-15: qty 2

## 2015-08-15 MED ORDER — SODIUM CHLORIDE 0.9 % IV SOLN
Freq: Once | INTRAVENOUS | Status: AC
  Administered 2015-08-15: 16:00:00 via INTRAVENOUS

## 2015-08-15 MED ORDER — HALOPERIDOL LACTATE 5 MG/ML IJ SOLN
2.0000 mg | Freq: Once | INTRAMUSCULAR | Status: AC
  Administered 2015-08-15: 2 mg via INTRAVENOUS
  Filled 2015-08-15: qty 1

## 2015-08-15 MED ORDER — ONDANSETRON HCL 4 MG/2ML IJ SOLN
4.0000 mg | Freq: Once | INTRAMUSCULAR | Status: AC | PRN
  Administered 2015-08-15: 4 mg via INTRAVENOUS

## 2015-08-15 MED ORDER — PROMETHAZINE HCL 25 MG RE SUPP: 25.0000 mg | Freq: Four times a day (QID) | RECTAL | 1 refills | Status: DC | PRN

## 2015-08-15 MED ORDER — PROMETHAZINE HCL 25 MG/ML IJ SOLN
25.0000 mg | Freq: Once | INTRAMUSCULAR | Status: AC
  Administered 2015-08-15: 25 mg via INTRAVENOUS
  Filled 2015-08-15: qty 1

## 2015-08-15 MED ORDER — ONDANSETRON HCL 4 MG/2ML IJ SOLN
4.0000 mg | Freq: Once | INTRAMUSCULAR | Status: AC
  Administered 2015-08-15: 4 mg via INTRAVENOUS
  Filled 2015-08-15: qty 2

## 2015-08-15 NOTE — ED Provider Notes (Signed)
[facility] Emergency Department Provider Note        Time seen: ----------------------------------------- 3:18 PM on 08/15/2015 -----------------------------------------    I have reviewed the triage vital signs and the nursing notes.   HISTORY  Chief Complaint Emesis    HPI Dakota Bowen is a 35 y.o. male who presents to ER for "cyclic vomiting syndrome".He states he has had constant vomiting since 3 AM today and has not tolerated anything by mouth. He does report a history of cyclic vomiting syndrome with no specific etiology. He has had diarrhea and abdominal pain currently. He is brought in shivering and vomiting. Patient reports having had a cholecystectomy in the past which did not relieve his symptoms.   Past Medical History:  Diagnosis Date  . Cyclical vomiting syndrome   . GERD (gastroesophageal reflux disease)     Patient Active Problem List   Diagnosis Date Noted  . Acute gastritis 05/19/2015  . Leukocytosis 05/19/2015  . Hyperglycemia 05/19/2015  . LLQ pain 05/19/2015  . Constipation 05/19/2015  . Nausea & vomiting 05/16/2015  . Malnutrition of moderate degree ([place]) 08/11/2014  . Hematemesis with nausea   . Hematemesis 08/09/2014  . Duodenitis 06/22/2014  . Gastritis 06/17/2014  . Vomiting 06/15/2014    Past Surgical History:  Procedure Laterality Date  . CHOLECYSTECTOMY    . ESOPHAGOGASTRODUODENOSCOPY (EGD) WITH PROPOFOL N/A 06/21/2014   Procedure: ESOPHAGOGASTRODUODENOSCOPY (EGD) WITH PROPOFOL. Looking in the esophagus stomach and upper small intestine with a lighted tube to examine and treat.;  Surgeon: Lollie Sails, MD;  Location: [facility] ENDOSCOPY;  Service: Endoscopy;  Laterality: N/A;    Allergies Penicillins  Social History Social History  Substance Use Topics  . Smoking status: Current Every Day Smoker    Packs/day: 1.50  . Smokeless tobacco: Not on file  . Alcohol use Yes     Comment: 6-7 beers a week     Review of Systems Constitutional: Negative for fever. Cardiovascular: Negative for chest pain. Respiratory: Negative for shortness of breath. Gastrointestinal: Positive for abdominal pain, vomiting and diarrhea Genitourinary: Negative for dysuria. Musculoskeletal: Negative for back pain. Skin: Negative for rash. Neurological: Negative for headaches, positive for weakness  10-point ROS otherwise negative.  ____________________________________________   PHYSICAL EXAM:  VITAL SIGNS: ED Triage Vitals [08/15/15 1401]  Enc Vitals Group     BP (!) 154/79     Pulse Rate (!) 59     Resp 16     Temp 97.7 F (36.5 C)     Temp Source Oral     SpO2 100 %     Weight 136 lb (61.7 kg)     Height      Head Circumference      Peak Flow      Pain Score 7     Pain Loc      Pain Edu?      Excl. in Blanket?     Constitutional: Alert and oriented. Mild distress Eyes: Conjunctivae are normal. PERRL. Normal extraocular movements. ENT   Head: Normocephalic and atraumatic.   Nose: No congestion/rhinnorhea.   Mouth/Throat: Mucous membranes are moist.   Neck: No stridor. Cardiovascular: Normal rate, regular rhythm. No murmurs, rubs, or gallops. Respiratory: Normal respiratory effort without tachypnea nor retractions. Breath sounds are clear and equal bilaterally. No wheezes/rales/rhonchi. Gastrointestinal: Nonfocal tenderness, normal bowel sounds. Musculoskeletal: Nontender with normal range of motion in all extremities. No lower extremity tenderness nor edema. Neurologic:  Normal speech and language. No gross focal  neurologic deficits are appreciated.  Skin:  Skin is warm, dry and intact. Piloerection Psychiatric: Mood and affect are normal. Speech and behavior are normal.  ____________________________________________  ED COURSE:  Pertinent labs & imaging results that were available during my care of the patient were reviewed by me and considered in my medical decision making  (see chart for details). Clinical Course  Patient resents in mild distress from possible dehydration and from cyclic vomiting syndrome. He'll be given IV fluids as well as antipsychotics and antiemetics.  Procedures ____________________________________________   LABS (pertinent positives/negatives)  Labs Reviewed  COMPREHENSIVE METABOLIC PANEL - Abnormal; Notable for the following:       Result Value   Glucose, Bld 150 (*)    All other components within normal limits  LIPASE, BLOOD  CBC  URINALYSIS COMPLETEWITH MICROSCOPIC ([facility] ONLY)   ____________________________________________  FINAL ASSESSMENT AND PLAN  Cyclic vomiting syndrome  Plan: Patient with labs as dictated above. Patient is in no acute distress, currently feeling better after Ativan, Haldol, Zofran, Reglan and saline. He is stable for outpatient follow-up, I will prescribe Phenergan.   Earleen Newport, MD   Note: This dictation was prepared with Dragon dictation. Any transcriptional errors that result from this process are unintentional    Earleen Newport, MD 08/15/15 1714

## 2015-08-15 NOTE — ED Notes (Signed)
Sleeping.  fam member at bedside

## 2015-08-15 NOTE — ED Triage Notes (Signed)
Pt arrives to ER via POV c/o vomiting constantly since 3AM today. Pt hx of cyclic vomiting syndrome. Abdominal pain and diarrhea present. PT shivering and vomiting at time of triage.

## 2015-08-16 ENCOUNTER — Encounter: Payer: Self-pay | Admitting: Emergency Medicine

## 2015-08-16 ENCOUNTER — Emergency Department
Admission: EM | Admit: 2015-08-16 | Discharge: 2015-08-17 | Disposition: A | Discharge status: Home / Self Care | Payer: Self-pay | Attending: Emergency Medicine | Admitting: Emergency Medicine

## 2015-08-16 DIAGNOSIS — F17200 Nicotine dependence, unspecified, uncomplicated: Secondary | ICD-10-CM | POA: Insufficient documentation

## 2015-08-16 DIAGNOSIS — R1115 Cyclical vomiting syndrome unrelated to migraine: Secondary | ICD-10-CM

## 2015-08-16 DIAGNOSIS — R1084 Generalized abdominal pain: Secondary | ICD-10-CM | POA: Insufficient documentation

## 2015-08-16 DIAGNOSIS — G43A0 Cyclical vomiting, not intractable: Secondary | ICD-10-CM | POA: Insufficient documentation

## 2015-08-16 DIAGNOSIS — Z79899 Other long term (current) drug therapy: Secondary | ICD-10-CM | POA: Insufficient documentation

## 2015-08-16 LAB — CBC
HCT: 46.6 % (ref 40.0–52.0)
HEMOGLOBIN: 16.5 g/dL (ref 13.0–18.0)
MCH: 32.3 pg (ref 26.0–34.0)
MCHC: 35.4 g/dL (ref 32.0–36.0)
MCV: 91.2 fL (ref 80.0–100.0)
PLATELETS: 226 10*3/uL (ref 150–440)
RBC: 5.11 MIL/uL (ref 4.40–5.90)
RDW: 13.5 % (ref 11.5–14.5)
WBC: 10.8 10*3/uL — ABNORMAL HIGH (ref 3.8–10.6)

## 2015-08-16 LAB — COMPREHENSIVE METABOLIC PANEL
ALBUMIN: 4.7 g/dL (ref 3.5–5.0)
ALK PHOS: 61 U/L (ref 38–126)
ALT: 20 U/L (ref 17–63)
AST: 22 U/L (ref 15–41)
Anion gap: 9 (ref 5–15)
BUN: 10 mg/dL (ref 6–20)
CHLORIDE: 99 mmol/L — AB (ref 101–111)
CO2: 27 mmol/L (ref 22–32)
CREATININE: 0.72 mg/dL (ref 0.61–1.24)
Calcium: 9.3 mg/dL (ref 8.9–10.3)
GFR calc non Af Amer: >60 mL/min (ref >60)
GLUCOSE: 108 mg/dL — AB (ref 65–99)
Potassium: 3 mmol/L — ABNORMAL LOW (ref 3.5–5.1)
SODIUM: 135 mmol/L (ref 135–145)
Total Bilirubin: 0.9 mg/dL (ref 0.3–1.2)
Total Protein: 7.7 g/dL (ref 6.5–8.1)

## 2015-08-16 LAB — LIPASE, BLOOD: LIPASE: 25 U/L (ref 11–51)

## 2015-08-16 MED ORDER — LORAZEPAM 2 MG/ML IJ SOLN
2.0000 mg | Freq: Once | INTRAMUSCULAR | Status: AC
  Administered 2015-08-16: 2 mg via INTRAVENOUS
  Filled 2015-08-16: qty 1

## 2015-08-16 MED ORDER — PROMETHAZINE HCL 25 MG/ML IJ SOLN
INTRAMUSCULAR | Status: AC
  Administered 2015-08-16: 12.5 mg via INTRAVENOUS
  Filled 2015-08-16: qty 1

## 2015-08-16 MED ORDER — PROMETHAZINE HCL 25 MG/ML IJ SOLN
12.5000 mg | Freq: Once | INTRAMUSCULAR | Status: AC
  Administered 2015-08-16: 12.5 mg via INTRAVENOUS

## 2015-08-16 MED ORDER — SODIUM CHLORIDE 0.9 % IV BOLUS (SEPSIS)
1000.0000 mL | Freq: Once | INTRAVENOUS | Status: AC
  Administered 2015-08-16: 1000 mL via INTRAVENOUS

## 2015-08-16 MED ORDER — POTASSIUM CHLORIDE 10 MEQ/100ML IV SOLN
10.0000 meq | INTRAVENOUS | Status: AC
  Administered 2015-08-17 (×3): 10 meq via INTRAVENOUS
  Filled 2015-08-16 (×3): qty 100

## 2015-08-16 NOTE — ED Notes (Signed)
Lea RN charge attempted IV x2 without success - per charge nurse Raquel RN called to attempt to start IV

## 2015-08-16 NOTE — ED Notes (Signed)
Pt was here yesterday for nausea and vomiting with abd pain - he continues to vomit today (unable to estimate how many times he has vomited in the last 24 hours) - Pt family is very verbal and demanding something for the pt pain, vomiting, and fluids to rehydrate him - He has not eaten or drank in 3 days without vomiting - Pt reports the last time he voided was Sunday morning

## 2015-08-16 NOTE — ED Provider Notes (Signed)
[facility] Emergency Department Provider Note   ____________________________________________   First MD Initiated Contact with Patient 08/16/15 2314     (approximate)  I have reviewed the triage vital signs and the nursing notes.   HISTORY  Chief Complaint Emesis    HPI Dakota Bowen is a 35 y.o. male with a history of cyclic vomiting syndrome who comes into the hospital today with vomiting. He reports is been going on for days and he thinks he is getting dehydrated. He is also feeling weak and he can't stop vomiting. The patient reports that he's been given Phenergan pills in the past that he can keep it down. The patient was seen here yesterday and reports that he was sent home after a short amount of time. According to the previous note the patient received some medications and was sent home with improvement in his symptoms. The patient reports that soon as he arrived home he started vomiting. He reports that his emesis is yellow with phlegm. He reports that he has not seen a specialist. He reports that this all started on Sunday morning. He's had one episode of diarrhea since then. He has some diffuse abdominal pain which she rates a 10 out of 10 in intensity and describes it as crampy. The patient also has some throat pain. When asked he reports that this happens once every couple of months. He is having some chest pain headache dizziness as well. Is here for evaluation of his symptoms.   Past Medical History:  Diagnosis Date  . Cyclical vomiting syndrome   . GERD (gastroesophageal reflux disease)     Patient Active Problem List   Diagnosis Date Noted  . Acute gastritis 05/19/2015  . Leukocytosis 05/19/2015  . Hyperglycemia 05/19/2015  . LLQ pain 05/19/2015  . Constipation 05/19/2015  . Nausea & vomiting 05/16/2015  . Malnutrition of moderate degree ([place]) 08/11/2014  . Hematemesis with nausea   . Hematemesis 08/09/2014  . Duodenitis  06/22/2014  . Gastritis 06/17/2014  . Vomiting 06/15/2014    Past Surgical History:  Procedure Laterality Date  . CHOLECYSTECTOMY    . ESOPHAGOGASTRODUODENOSCOPY (EGD) WITH PROPOFOL N/A 06/21/2014   Procedure: ESOPHAGOGASTRODUODENOSCOPY (EGD) WITH PROPOFOL. Looking in the esophagus stomach and upper small intestine with a lighted tube to examine and treat.;  Surgeon: Lollie Sails, MD;  Location: [facility] ENDOSCOPY;  Service: Endoscopy;  Laterality: N/A;    Prior to Admission medications   Medication Sig Start Date End Date Taking? Authorizing Provider  metoCLOPramide (REGLAN) 10 MG tablet Take 1 tablet (10 mg total) by mouth every 8 (eight) hours as needed. 08/17/15   Loney Hering, MD  ondansetron (ZOFRAN) 4 MG tablet Take 1 tablet (4 mg total) by mouth every 6 (six) hours as needed for nausea. 05/19/15   Theodoro Grist, MD  pantoprazole (PROTONIX) 40 MG tablet Take 1 tablet (40 mg total) by mouth daily. 05/19/15   Theodoro Grist, MD  promethazine (PHENERGAN) 25 MG suppository Place 1 suppository (25 mg total) rectally every 6 (six) hours as needed for nausea. 08/15/15 08/14/16  Earleen Newport, MD  promethazine (PHENERGAN) 25 MG tablet Take 1 tablet (25 mg total) by mouth every 4 (four) hours as needed for nausea or vomiting. 08/15/15   Earleen Newport, MD  sucralfate (CARAFATE) 1 g tablet Take 1 tablet (1 g total) by mouth 4 (four) times daily -  with meals and at bedtime. 05/19/15   Theodoro Grist, MD  Allergies Penicillins  Family History  Problem Relation Age of Onset  . Diabetes Mother   . Diabetes Father     Social History Social History  Substance Use Topics  . Smoking status: Current Every Day Smoker    Packs/day: 1.50  . Smokeless tobacco: Never Used  . Alcohol use Yes     Comment: 3-4beers/day    Review of Systems Constitutional: No fever/chills Eyes: No visual changes. ENT:  sore throat. Cardiovascular:  chest pain. Respiratory:  shortness of  breath. Gastrointestinal:  abdominal pain, nausea, vomiting.  No diarrhea.  No constipation. Genitourinary: Negative for dysuria. Musculoskeletal: Negative for back pain. Skin: Negative for rash. Neurological: Headache and dizziness  10-point ROS otherwise negative.  ____________________________________________   PHYSICAL EXAM:  VITAL SIGNS: ED Triage Vitals [08/16/15 2032]  Enc Vitals Group     BP (!) 131/96     Pulse Rate 74     Resp 18     Temp 98.2 F (36.8 C)     Temp Source Oral     SpO2 99 %     Weight 150 lb (68 kg)     Height 6' (1.829 m)     Head Circumference      Peak Flow      Pain Score 9     Pain Loc      Pain Edu?      Excl. in [place]?     Constitutional: Alert and oriented. Well appearing and in Moderate distress. Eyes: Conjunctivae are normal. PERRL. EOMI. Head: Atraumatic. Nose: No congestion/rhinnorhea. Mouth/Throat: Mucous membranes are moist.  Oropharynx non-erythematous. Cardiovascular: Normal rate, regular rhythm. Grossly normal heart sounds.  Good peripheral circulation. Respiratory: Normal respiratory effort.  No retractions. Lungs CTAB. Gastrointestinal: Soft with diffuse tenderness to palpation. No distention. Positive bowel sounds Musculoskeletal: No lower extremity tenderness nor edema.   Neurologic:  Normal speech and language.  Skin:  Skin is warm, dry and intact.  Psychiatric: Mood and affect are normal.   ____________________________________________   LABS (all labs ordered are listed, but only abnormal results are displayed)  Labs Reviewed  COMPREHENSIVE METABOLIC PANEL - Abnormal; Notable for the following:       Result Value   Potassium 3.0 (*)    Chloride 99 (*)    Glucose, Bld 108 (*)    All other components within normal limits  CBC - Abnormal; Notable for the following:    WBC 10.8 (*)    All other components within normal limits  URINALYSIS COMPLETEWITH MICROSCOPIC ([facility] ONLY) - Abnormal; Notable for the following:     Color, Urine YELLOW (*)    APPearance CLEAR (*)    Glucose, UA 50 (*)    Ketones, ur TRACE (*)    Protein, ur 30 (*)    All other components within normal limits  LIPASE, BLOOD   ____________________________________________  EKG  none ____________________________________________  RADIOLOGY  CT abdomen and pelvis: Findings concerning for mild colitis ____________________________________________   PROCEDURES  Procedure(s) performed: None  Procedures  Critical Care performed: No  ____________________________________________   INITIAL IMPRESSION / ASSESSMENT AND PLAN / ED COURSE  Pertinent labs & imaging results that were available during my care of the patient were reviewed by me and considered in my medical decision making (see chart for details).  This is a 35 year old male who comes into the hospital today with vomiting and nausea. The patient does have a history of cyclic vomiting syndrome. The patient received a liter of normal saline  and some Phenergan. I will also give him some Ativan and reassess the patient. If his pain persists I will then perform a CT scan of his abdomen to determine if he has any intra-abdominal pathology.  The patient received a dose of Zofran as he was still vomiting and also 2.5 mg of Haldol. He received a second liter of normal saline and then Reglan 10 mg IV. I did a CT scan and it did not show any acute illness. He did have some bowel wall thickening and a possibility of mild colitis. The patient though has not been having any diarrhea. I discussed with the patient whether he would like to try the Reglan at home instead of Phenergan suppositories and he said that he would try the medicine at home. The patient reports that he is nauseous but he was able to drink his contrast without any vomiting. The patient will be discharged home to follow-up with his primary care physician.   ____________________________________________   FINAL  CLINICAL IMPRESSION(S) / ED DIAGNOSES  Final diagnoses:  Non-intractable cyclical vomiting with nausea  Generalized abdominal pain      NEW MEDICATIONS STARTED DURING THIS VISIT:  Discharge Medication List as of 08/17/2015  6:31 AM    START taking these medications   Details  metoCLOPramide (REGLAN) 10 MG tablet Take 1 tablet (10 mg total) by mouth every 8 (eight) hours as needed., Starting Wed 08/17/2015, Print         Note:  This document was prepared using Dragon voice recognition software and may include unintentional dictation errors.    Loney Hering, MD 08/17/15 [phone]

## 2015-08-16 NOTE — ED Triage Notes (Addendum)
Pt reports N/V, generalized abd pain since Sunday with hx cyclic vomiting; st seen here last night and received IVFs; st unable to keep PO antiemetics down and did not get phenergan supposity rx filled

## 2015-08-16 NOTE — ED Notes (Signed)
Pt unable to urinate at this moment, given specimen cup for when is able to void.  

## 2015-08-16 NOTE — ED Notes (Addendum)
Attempted to restart IV x2 in left fa without success - Iris RN called to attempt - Iris RN attempted x2 without success - Lea RN charge nurse notified and she states she will attempt - pt is aware of need for urine sample but is unable to void at this time

## 2015-08-17 ENCOUNTER — Encounter: Payer: Self-pay | Admitting: Radiology

## 2015-08-17 ENCOUNTER — Emergency Department: Payer: Self-pay

## 2015-08-17 LAB — URINALYSIS COMPLETE WITH MICROSCOPIC (ARMC ONLY)
BILIRUBIN URINE: NEGATIVE
Bacteria, UA: NONE SEEN
Glucose, UA: 50 mg/dL — AB
HGB URINE DIPSTICK: NEGATIVE
LEUKOCYTES UA: NEGATIVE
Nitrite: NEGATIVE
PH: 7 (ref 5.0–8.0)
Protein, ur: 30 mg/dL — AB
Specific Gravity, Urine: 1.017 (ref 1.005–1.030)
Squamous Epithelial / LPF: NONE SEEN

## 2015-08-17 MED ORDER — HALOPERIDOL LACTATE 5 MG/ML IJ SOLN
INTRAMUSCULAR | Status: AC
  Administered 2015-08-17: 2.5 mg via INTRAVENOUS
  Filled 2015-08-17: qty 1

## 2015-08-17 MED ORDER — IOPAMIDOL (ISOVUE-300) INJECTION 61%
100.0000 mL | Freq: Once | INTRAVENOUS | Status: AC | PRN
  Administered 2015-08-17: 100 mL via INTRAVENOUS

## 2015-08-17 MED ORDER — ONDANSETRON HCL 4 MG/2ML IJ SOLN
4.0000 mg | Freq: Once | INTRAMUSCULAR | Status: AC
  Administered 2015-08-17: 4 mg via INTRAVENOUS

## 2015-08-17 MED ORDER — SODIUM CHLORIDE 0.9 % IV BOLUS (SEPSIS)
1000.0000 mL | Freq: Once | INTRAVENOUS | Status: AC
  Administered 2015-08-17: 1000 mL via INTRAVENOUS

## 2015-08-17 MED ORDER — HALOPERIDOL LACTATE 5 MG/ML IJ SOLN
2.5000 mg | Freq: Once | INTRAMUSCULAR | Status: AC
  Administered 2015-08-17: 2.5 mg via INTRAVENOUS

## 2015-08-17 MED ORDER — METOCLOPRAMIDE HCL 10 MG PO TABS: 10.0000 mg | ORAL_TABLET | Freq: Three times a day (TID) | ORAL | 0 refills | Status: DC | PRN

## 2015-08-17 MED ORDER — METOCLOPRAMIDE HCL 5 MG/ML IJ SOLN
10.0000 mg | Freq: Once | INTRAMUSCULAR | Status: AC
Start: 2015-08-17 — End: 2015-08-17
  Administered 2015-08-17: 10 mg via INTRAVENOUS
  Filled 2015-08-17: qty 2

## 2015-08-17 MED ORDER — DIATRIZOATE MEGLUMINE & SODIUM 66-10 % PO SOLN
15.0000 mL | Freq: Once | ORAL | Status: AC
  Administered 2015-08-17: 15 mL via ORAL

## 2015-08-17 MED ORDER — ONDANSETRON HCL 4 MG/2ML IJ SOLN
INTRAMUSCULAR | Status: AC
  Administered 2015-08-17: 4 mg via INTRAVENOUS
  Filled 2015-08-17: qty 2

## 2015-08-17 NOTE — ED Notes (Signed)
Pt assisted with ambulating to the restroom. Steady gait noted. No distress.

## 2015-08-17 NOTE — ED Notes (Signed)
Patient transported to CT 

## 2015-08-17 NOTE — ED Notes (Addendum)
Dr Dahlia Client notified of 10 out of 10 pain in abd - pt is actively vomiting - Dr Dahlia Client gave verbal order for Zofran 4mg  and Haldol 2.5mg  mixed in NACL - pt states he is still unable to void

## 2015-08-17 NOTE — ED Notes (Signed)
Pt is laying in the bed with eyes closed and appears in no distress at this time

## 2015-10-02 ENCOUNTER — Encounter: Payer: Self-pay | Admitting: Emergency Medicine

## 2015-10-02 ENCOUNTER — Emergency Department
Admission: EM | Admit: 2015-10-02 | Discharge: 2015-10-02 | Disposition: A | Discharge status: Home / Self Care | Payer: Self-pay | Attending: Emergency Medicine | Admitting: Emergency Medicine

## 2015-10-02 DIAGNOSIS — G43A0 Cyclical vomiting, not intractable: Secondary | ICD-10-CM | POA: Insufficient documentation

## 2015-10-02 DIAGNOSIS — R1115 Cyclical vomiting syndrome unrelated to migraine: Secondary | ICD-10-CM

## 2015-10-02 DIAGNOSIS — F17200 Nicotine dependence, unspecified, uncomplicated: Secondary | ICD-10-CM | POA: Insufficient documentation

## 2015-10-02 LAB — LIPASE, BLOOD: LIPASE: 27 U/L (ref 11–51)

## 2015-10-02 LAB — COMPREHENSIVE METABOLIC PANEL
ALBUMIN: 5.1 g/dL — AB (ref 3.5–5.0)
ALT: 26 U/L (ref 17–63)
AST: 26 U/L (ref 15–41)
Alkaline Phosphatase: 67 U/L (ref 38–126)
Anion gap: 11 (ref 5–15)
BUN: 13 mg/dL (ref 6–20)
CHLORIDE: 96 mmol/L — AB (ref 101–111)
CO2: 32 mmol/L (ref 22–32)
CREATININE: 1.19 mg/dL (ref 0.61–1.24)
Calcium: 9.8 mg/dL (ref 8.9–10.3)
GFR calc Af Amer: >60 mL/min (ref >60)
GLUCOSE: 173 mg/dL — AB (ref 65–99)
POTASSIUM: 3.2 mmol/L — AB (ref 3.5–5.1)
Sodium: 139 mmol/L (ref 135–145)
Total Bilirubin: 0.4 mg/dL (ref 0.3–1.2)
Total Protein: 8.8 g/dL — ABNORMAL HIGH (ref 6.5–8.1)

## 2015-10-02 LAB — CBC
HEMATOCRIT: 51.3 % (ref 40.0–52.0)
Hemoglobin: 17.8 g/dL (ref 13.0–18.0)
MCH: 32.1 pg (ref 26.0–34.0)
MCHC: 34.7 g/dL (ref 32.0–36.0)
MCV: 92.4 fL (ref 80.0–100.0)
PLATELETS: 321 10*3/uL (ref 150–440)
RBC: 5.55 MIL/uL (ref 4.40–5.90)
RDW: 13.6 % (ref 11.5–14.5)
WBC: 19.8 10*3/uL — AB (ref 3.8–10.6)

## 2015-10-02 MED ORDER — DIPHENHYDRAMINE HCL 50 MG/ML IJ SOLN
25.0000 mg | Freq: Once | INTRAMUSCULAR | Status: AC
  Administered 2015-10-02: 25 mg via INTRAVENOUS
  Filled 2015-10-02: qty 1

## 2015-10-02 MED ORDER — FAMOTIDINE IN NACL 20-0.9 MG/50ML-% IV SOLN
20.0000 mg | Freq: Once | INTRAVENOUS | Status: AC
  Administered 2015-10-02: 20 mg via INTRAVENOUS
  Filled 2015-10-02: qty 50

## 2015-10-02 MED ORDER — LACTATED RINGERS IV BOLUS (SEPSIS)
1000.0000 mL | Freq: Once | INTRAVENOUS | Status: AC
  Administered 2015-10-02: 1000 mL via INTRAVENOUS

## 2015-10-02 MED ORDER — SODIUM CHLORIDE 0.9 % IV BOLUS (SEPSIS)
1000.0000 mL | Freq: Once | INTRAVENOUS | Status: AC
  Administered 2015-10-02: 1000 mL via INTRAVENOUS

## 2015-10-02 MED ORDER — DEXTROSE 5 % AND 0.9 % NACL IV BOLUS
1000.0000 mL | Freq: Once | INTRAVENOUS | Status: DC
  Filled 2015-10-02: qty 1000

## 2015-10-02 MED ORDER — ONDANSETRON HCL 4 MG/2ML IJ SOLN
4.0000 mg | Freq: Once | INTRAMUSCULAR | Status: AC
  Administered 2015-10-02: 4 mg via INTRAVENOUS
  Filled 2015-10-02: qty 2

## 2015-10-02 MED ORDER — PROMETHAZINE HCL 25 MG RE SUPP: 25.0000 mg | Freq: Four times a day (QID) | RECTAL | 0 refills | Status: DC | PRN

## 2015-10-02 MED ORDER — GI COCKTAIL ~~LOC~~
30.0000 mL | Freq: Once | ORAL | Status: AC
  Administered 2015-10-02: 30 mL via ORAL
  Filled 2015-10-02: qty 30

## 2015-10-02 MED ORDER — METOCLOPRAMIDE HCL 5 MG/ML IJ SOLN
10.0000 mg | Freq: Once | INTRAMUSCULAR | Status: AC
  Administered 2015-10-02: 10 mg via INTRAVENOUS
  Filled 2015-10-02: qty 2

## 2015-10-02 MED ORDER — FAMOTIDINE 20 MG PO TABS: 20.0000 mg | ORAL_TABLET | Freq: Two times a day (BID) | ORAL | 0 refills | Status: DC

## 2015-10-02 NOTE — ED Provider Notes (Signed)
[facility] Emergency Department Provider Note  ____________________________________________  Time seen: Approximately 12:40 PM  I have reviewed the triage vital signs and the nursing notes.   HISTORY  Chief Complaint Emesis    HPI JHAMARI KOVALESKI is a 35 y.o. male who complains of upper abdominal pain nausea and vomiting. He reports that he has cyclical vomiting and GERD. He also reports that he has not been interfering to his usual diet and been eating a lot of foods known to provoke his GERD. No diarrhea. No fevers chills chest pain shortness of breath or dizziness or syncope. He reports difficulty tolerating oral intake for the past week and a 5 pound weight loss. Tried taking nausea medicine at home without relief. Pain is nonradiating, moderate intensity, worsened by oral intake, no alleviating factors.     Past Medical History:  Diagnosis Date  . Cyclical vomiting syndrome   . GERD (gastroesophageal reflux disease)      Patient Active Problem List   Diagnosis Date Noted  . Acute gastritis 05/19/2015  . Leukocytosis 05/19/2015  . Hyperglycemia 05/19/2015  . LLQ pain 05/19/2015  . Constipation 05/19/2015  . Nausea & vomiting 05/16/2015  . Malnutrition of moderate degree ([place]) 08/11/2014  . Hematemesis with nausea   . Hematemesis 08/09/2014  . Duodenitis 06/22/2014  . Gastritis 06/17/2014  . Vomiting 06/15/2014     Past Surgical History:  Procedure Laterality Date  . CHOLECYSTECTOMY    . ESOPHAGOGASTRODUODENOSCOPY (EGD) WITH PROPOFOL N/A 06/21/2014   Procedure: ESOPHAGOGASTRODUODENOSCOPY (EGD) WITH PROPOFOL. Looking in the esophagus stomach and upper small intestine with a lighted tube to examine and treat.;  Surgeon: Lollie Sails, MD;  Location[facility] ENDOSCOPY;  Service: Endoscopy;  Laterality: N/A;     Prior to Admission medications   Medication Sig Start Date End Date Taking? Authorizing Provider  famotidine (PEPCID) 20 MG  tablet Take 1 tablet (20 mg total) by mouth 2 (two) times daily. 10/02/15   Carrie Mew, MD  metoCLOPramide (REGLAN) 10 MG tablet Take 1 tablet (10 mg total) by mouth every 8 (eight) hours as needed. 08/17/15   Loney Hering, MD  ondansetron (ZOFRAN) 4 MG tablet Take 1 tablet (4 mg total) by mouth every 6 (six) hours as needed for nausea. 05/19/15   Theodoro Grist, MD  pantoprazole (PROTONIX) 40 MG tablet Take 1 tablet (40 mg total) by mouth daily. 05/19/15   Theodoro Grist, MD  promethazine (PHENERGAN) 25 MG suppository Place 1 suppository (25 mg total) rectally every 6 (six) hours as needed for nausea. 10/02/15   Carrie Mew, MD  sucralfate (CARAFATE) 1 g tablet Take 1 tablet (1 g total) by mouth 4 (four) times daily -  with meals and at bedtime. 05/19/15   Theodoro Grist, MD     Allergies Penicillins   Family History  Problem Relation Age of Onset  . Diabetes Mother   . Diabetes Father     Social History Social History  Substance Use Topics  . Smoking status: Current Every Day Smoker    Packs/day: 1.50  . Smokeless tobacco: Never Used  . Alcohol use Yes     Comment: 3-4beers/day    Review of Systems  Constitutional:   No fever or chills.  ENT:   No sore throat. No rhinorrhea. Cardiovascular:   No chest pain. Respiratory:   No dyspnea or cough. Gastrointestinal:   Positive upper abdominal pain with vomiting. No diarrhea or constipation..  10-point ROS otherwise negative.  ____________________________________________  PHYSICAL EXAM:  VITAL SIGNS: ED Triage Vitals  Enc Vitals Group     BP 10/02/15 0805 133/90     Pulse Rate 10/02/15 0805 75     Resp 10/02/15 0805 16     Temp 10/02/15 0805 98 F (36.7 C)     Temp Source 10/02/15 0805 Oral     SpO2 10/02/15 0805 98 %     Weight 10/02/15 0806 135 lb (61.2 kg)     Height 10/02/15 0806 6' (1.829 m)     Head Circumference --      Peak Flow --      Pain Score 10/02/15 0806 8     Pain Loc --      Pain Edu? --       Excl. in [place]? --     Vital signs reviewed, nursing assessments reviewed.   Constitutional:   Alert and oriented. Ill-appearing. Eyes:   No scleral icterus. No conjunctival pallor. PERRL. EOMI.  No nystagmus. ENT   Head:   Normocephalic and atraumatic.   Nose:   No congestion/rhinnorhea. No septal hematoma   Mouth/Throat:   Dry mucous membranes, no pharyngeal erythema. No peritonsillar mass.    Neck:   No stridor. No SubQ emphysema. No meningismus. Hematological/Lymphatic/Immunilogical:   No cervical lymphadenopathy. Cardiovascular:   Tachycardia heart rate 110. Symmetric bilateral radial and DP pulses.  No murmurs.  Respiratory:   Normal respiratory effort without tachypnea nor retractions. Breath sounds are clear and equal bilaterally. No wheezes/rales/rhonchi. Gastrointestinal:   Soft with epigastric tenderness. Non distended. There is no CVA tenderness.  No rebound, rigidity, or guarding. Genitourinary:   deferred Musculoskeletal:   Nontender with normal range of motion in all extremities. No joint effusions.  No lower extremity tenderness.  No edema. Neurologic:   Normal speech and language.  CN 2-10 normal. Motor grossly intact. No gross focal neurologic deficits are appreciated.  Skin:    Skin is warm, dry and intact. No rash noted.  No petechiae, purpura, or bullae.  ____________________________________________    LABS (pertinent positives/negatives) (all labs ordered are listed, but only abnormal results are displayed) Labs Reviewed  COMPREHENSIVE METABOLIC PANEL - Abnormal; Notable for the following:       Result Value   Potassium 3.2 (*)    Chloride 96 (*)    Glucose, Bld 173 (*)    Total Protein 8.8 (*)    Albumin 5.1 (*)    All other components within normal limits  CBC - Abnormal; Notable for the following:    WBC 19.8 (*)    All other components within normal limits  LIPASE, BLOOD  URINALYSIS COMPLETEWITH MICROSCOPIC ([facility] ONLY)    ____________________________________________   EKG    ____________________________________________    RADIOLOGY    ____________________________________________   PROCEDURES Procedures  ____________________________________________   INITIAL IMPRESSION / ASSESSMENT AND PLAN / ED COURSE  Pertinent labs & imaging results that were available during my care of the patient were reviewed by me and considered in my medical decision making (see chart for details).  Patient presents with epigastric tenderness and vomiting. Clinically dehydrated consistent with symptoms for the past week. Labs unremarkable without acidosis. Has a leukocytosis but no apparent infectious source. Abdomen is overall benign. I think the leukocytosis is an acute phase wreck and due to his recurrent vomiting and pain.  Considering the patient's symptoms, medical history, and physical examination today, I have low suspicion for cholecystitis or biliary pathology, pancreatitis, perforation or bowel obstruction, hernia, intra-abdominal  abscess, AAA or dissection, volvulus or intussusception, mesenteric ischemia, or appendicitis.  After 2 L of IV fluids, IV antiemetics and antacids, patient is feeling better and tolerating oral intake. I'll prescribe famotidine and Phenergan suppositories for home use in addition to the Reglan Carafate PPI and Zofran he already has. Continue to follow up with primary care.  EGD performed by GI one year ago shows some chronic inflammation and duodenitis with benign biopsies     Clinical Course   ____________________________________________   FINAL CLINICAL IMPRESSION(S) / ED DIAGNOSES  Final diagnoses:  Non-intractable cyclical vomiting with nausea       Portions of this note were generated with dragon dictation software. Dictation errors may occur despite best attempts at proofreading.    Carrie Mew, MD 10/02/15 1245

## 2015-10-02 NOTE — ED Notes (Signed)
NAD noted at time of D/C. Pt denies questions or concerns. Pt ambulatory to the lobby at this time. Pt refused wheelchair to the lobby.  

## 2015-10-02 NOTE — ED Notes (Signed)
This RN introduced self to patient. Pt noted to be resting in bed with no acute distress. Pt states "I feel like shit". Patient noted to have 3 visitors at bedside, this RN explained 2 visitor policy to patient and patient states understanding. Pt requests a sprite to drink, states he feels like he can keep it down. Per MD okay to give.

## 2015-10-02 NOTE — ED Notes (Signed)
Pt tolerating PO fluids. Pt states he has not vomited since being given a Sprite. Delay and treatment plan explained to patient. Pt states understanding at this time.

## 2015-10-02 NOTE — ED Triage Notes (Signed)
Pt reports having a hx of cyclic vomiting which started yesterday. Pt states he just needs some fluids and nausea medication. Pt denies any diarrhea. Pt states he comes in for the same thing every 3 months.

## 2015-10-02 NOTE — ED Notes (Signed)
IVF's orders from pharmacy

## 2015-10-02 NOTE — ED Triage Notes (Signed)
"  I can't stop throwing up".  States vomiting began last night.  Patient states he has cyclical vomiting syndrome.  Has phenergan at home, but unable to keep meds down.

## 2016-02-24 ENCOUNTER — Encounter: Payer: Self-pay | Admitting: Emergency Medicine

## 2016-02-24 ENCOUNTER — Inpatient Hospital Stay: Payer: Self-pay

## 2016-02-24 ENCOUNTER — Inpatient Hospital Stay
Admission: EM | Admit: 2016-02-24 | Discharge: 2016-03-01 | DRG: 370 | Disposition: A | Discharge status: Home / Self Care | Payer: Self-pay | Attending: Specialist | Admitting: Specialist

## 2016-02-24 DIAGNOSIS — E876 Hypokalemia: Secondary | ICD-10-CM | POA: Diagnosis present

## 2016-02-24 DIAGNOSIS — K226 Gastro-esophageal laceration-hemorrhage syndrome: Principal | ICD-10-CM | POA: Diagnosis present

## 2016-02-24 DIAGNOSIS — F1290 Cannabis use, unspecified, uncomplicated: Secondary | ICD-10-CM | POA: Diagnosis present

## 2016-02-24 DIAGNOSIS — R1084 Generalized abdominal pain: Secondary | ICD-10-CM

## 2016-02-24 DIAGNOSIS — G43A0 Cyclical vomiting, not intractable: Secondary | ICD-10-CM | POA: Diagnosis present

## 2016-02-24 DIAGNOSIS — K2970 Gastritis, unspecified, without bleeding: Secondary | ICD-10-CM | POA: Diagnosis present

## 2016-02-24 DIAGNOSIS — Z79899 Other long term (current) drug therapy: Secondary | ICD-10-CM

## 2016-02-24 DIAGNOSIS — Z880 Allergy status to penicillin: Secondary | ICD-10-CM

## 2016-02-24 DIAGNOSIS — F17210 Nicotine dependence, cigarettes, uncomplicated: Secondary | ICD-10-CM | POA: Diagnosis present

## 2016-02-24 DIAGNOSIS — K922 Gastrointestinal hemorrhage, unspecified: Secondary | ICD-10-CM

## 2016-02-24 DIAGNOSIS — E860 Dehydration: Secondary | ICD-10-CM | POA: Diagnosis present

## 2016-02-24 DIAGNOSIS — K210 Gastro-esophageal reflux disease with esophagitis: Secondary | ICD-10-CM | POA: Diagnosis present

## 2016-02-24 DIAGNOSIS — R1110 Vomiting, unspecified: Secondary | ICD-10-CM

## 2016-02-24 DIAGNOSIS — K920 Hematemesis: Secondary | ICD-10-CM | POA: Diagnosis present

## 2016-02-24 DIAGNOSIS — F1490 Cocaine use, unspecified, uncomplicated: Secondary | ICD-10-CM | POA: Diagnosis present

## 2016-02-24 LAB — URINE DRUG SCREEN, QUALITATIVE (ARMC ONLY)
Amphetamines, Ur Screen: NOT DETECTED
BARBITURATES, UR SCREEN: NOT DETECTED
BENZODIAZEPINE, UR SCRN: POSITIVE — AB
CANNABINOID 50 NG, UR ~~LOC~~: POSITIVE — AB
Cocaine Metabolite,Ur ~~LOC~~: POSITIVE — AB
MDMA (Ecstasy)Ur Screen: NOT DETECTED
Methadone Scn, Ur: NOT DETECTED
OPIATE, UR SCREEN: POSITIVE — AB
PHENCYCLIDINE (PCP) UR S: NOT DETECTED
Tricyclic, Ur Screen: NOT DETECTED

## 2016-02-24 LAB — COMPREHENSIVE METABOLIC PANEL
ALT: 50 U/L (ref 17–63)
ANION GAP: 16 — AB (ref 5–15)
AST: 37 U/L (ref 15–41)
Albumin: 5.1 g/dL — ABNORMAL HIGH (ref 3.5–5.0)
Alkaline Phosphatase: 131 U/L — ABNORMAL HIGH (ref 38–126)
BUN: 17 mg/dL (ref 6–20)
CALCIUM: 10.1 mg/dL (ref 8.9–10.3)
CO2: 27 mmol/L (ref 22–32)
CREATININE: 1.28 mg/dL — AB (ref 0.61–1.24)
Chloride: 95 mmol/L — ABNORMAL LOW (ref 101–111)
Glucose, Bld: 145 mg/dL — ABNORMAL HIGH (ref 65–99)
Potassium: 3.9 mmol/L (ref 3.5–5.1)
SODIUM: 138 mmol/L (ref 135–145)
Total Bilirubin: 0.8 mg/dL (ref 0.3–1.2)
Total Protein: 8.5 g/dL — ABNORMAL HIGH (ref 6.5–8.1)

## 2016-02-24 LAB — URINALYSIS, COMPLETE (UACMP) WITH MICROSCOPIC
BILIRUBIN URINE: NEGATIVE
GLUCOSE, UA: 50 mg/dL — AB
Hgb urine dipstick: NEGATIVE
KETONES UR: 20 mg/dL — AB
LEUKOCYTES UA: NEGATIVE
Nitrite: NEGATIVE
PH: 7 (ref 5.0–8.0)
PROTEIN: 30 mg/dL — AB
Specific Gravity, Urine: 1.027 (ref 1.005–1.030)

## 2016-02-24 LAB — CBC
HCT: 51.1 % (ref 40.0–52.0)
HEMOGLOBIN: 17.4 g/dL (ref 13.0–18.0)
MCH: 30.8 pg (ref 26.0–34.0)
MCHC: 34 g/dL (ref 32.0–36.0)
MCV: 90.8 fL (ref 80.0–100.0)
PLATELETS: 305 10*3/uL (ref 150–440)
RBC: 5.63 MIL/uL (ref 4.40–5.90)
RDW: 12.6 % (ref 11.5–14.5)
WBC: 17.2 10*3/uL — AB (ref 3.8–10.6)

## 2016-02-24 LAB — TYPE AND SCREEN
ABO/RH(D): A POS
Antibody Screen: NEGATIVE

## 2016-02-24 LAB — LIPASE, BLOOD: LIPASE: 22 U/L (ref 11–51)

## 2016-02-24 MED ORDER — ONDANSETRON HCL 4 MG/2ML IJ SOLN
4.0000 mg | Freq: Four times a day (QID) | INTRAMUSCULAR | Status: DC | PRN
  Administered 2016-02-24 – 2016-02-26 (×6): 4 mg via INTRAVENOUS
  Filled 2016-02-24 (×7): qty 2

## 2016-02-24 MED ORDER — PANTOPRAZOLE SODIUM 40 MG IV SOLR: 40.0000 mg | Freq: Two times a day (BID) | INTRAVENOUS | Status: DC

## 2016-02-24 MED ORDER — MORPHINE SULFATE (PF) 4 MG/ML IV SOLN
4.0000 mg | Freq: Once | INTRAVENOUS | Status: AC
  Administered 2016-02-24: 4 mg via INTRAVENOUS
  Filled 2016-02-24: qty 1

## 2016-02-24 MED ORDER — SODIUM CHLORIDE 0.9 % IV BOLUS (SEPSIS)
1000.0000 mL | Freq: Once | INTRAVENOUS | Status: AC
  Administered 2016-02-24: 1000 mL via INTRAVENOUS

## 2016-02-24 MED ORDER — INFLUENZA VAC SPLIT QUAD 0.5 ML IM SUSY: 0.5000 mL | PREFILLED_SYRINGE | INTRAMUSCULAR | Status: DC

## 2016-02-24 MED ORDER — PANTOPRAZOLE SODIUM 40 MG IV SOLR: 40.0000 mg | Freq: Once | INTRAVENOUS | Status: DC

## 2016-02-24 MED ORDER — SODIUM CHLORIDE 0.9 % IV SOLN
INTRAVENOUS | Status: DC
  Administered 2016-02-24 – 2016-03-01 (×17): via INTRAVENOUS

## 2016-02-24 MED ORDER — SODIUM CHLORIDE 0.9 % IV SOLN
80.0000 mg | Freq: Once | INTRAVENOUS | Status: AC
  Administered 2016-02-24: 80 mg via INTRAVENOUS
  Filled 2016-02-24: qty 80

## 2016-02-24 MED ORDER — PROMETHAZINE HCL 25 MG/ML IJ SOLN
25.0000 mg | Freq: Four times a day (QID) | INTRAMUSCULAR | Status: DC | PRN
  Administered 2016-02-24 – 2016-03-01 (×19): 25 mg via INTRAVENOUS
  Filled 2016-02-24 (×19): qty 1

## 2016-02-24 MED ORDER — SODIUM CHLORIDE 0.9 % IV SOLN
8.0000 mg/h | INTRAVENOUS | Status: DC
  Administered 2016-02-24 – 2016-02-25 (×2): 8 mg/h via INTRAVENOUS
  Filled 2016-02-24 (×3): qty 80

## 2016-02-24 MED ORDER — ONDANSETRON HCL 4 MG/2ML IJ SOLN
4.0000 mg | Freq: Once | INTRAMUSCULAR | Status: AC | PRN
  Administered 2016-02-24: 4 mg via INTRAVENOUS
  Filled 2016-02-24: qty 2

## 2016-02-24 MED ORDER — ONDANSETRON HCL 4 MG/2ML IJ SOLN
4.0000 mg | Freq: Once | INTRAMUSCULAR | Status: AC
  Administered 2016-02-24: 4 mg via INTRAVENOUS
  Filled 2016-02-24: qty 2

## 2016-02-24 MED ORDER — OXYCODONE-ACETAMINOPHEN 5-325 MG PO TABS
1.0000 | ORAL_TABLET | Freq: Four times a day (QID) | ORAL | Status: DC | PRN
  Administered 2016-02-24 – 2016-02-27 (×7): 1 via ORAL
  Filled 2016-02-24 (×8): qty 1

## 2016-02-24 NOTE — ED Notes (Signed)
Patient actively vomiting upon entry into room. Emotional support provided. Patient placed on cardiac monitor. Given new emesis bag and cold rag.

## 2016-02-24 NOTE — ED Provider Notes (Signed)
[facility] Emergency Department Provider Note  ____________________________________________   First MD Initiated Contact with Patient 02/24/16 1107     (approximate)  I have reviewed the triage vital signs and the nursing notes.   HISTORY  Chief Complaint Emesis   HPI Dakota Bowen is a 36 y.o. male with a history of cyclic vomiting syndrome who is presenting to the emergency department with over 100 episodes of vomiting over the past 24 hours.Says that he has had intermittent blood in his vomitus. Denies diarrhea. Says that he is having diffuse abdominal pain which is a 7 out of 10 and cramping right now. Denies any radiation. Says that he last smoked marijuana several weeks ago. Says that he has had multiple episodes of cyclic vomiting in the past and has been admitted to the hospital for them.   Past Medical History:  Diagnosis Date  . Cyclical vomiting syndrome   . GERD (gastroesophageal reflux disease)     Patient Active Problem List   Diagnosis Date Noted  . Acute gastritis 05/19/2015  . Leukocytosis 05/19/2015  . Hyperglycemia 05/19/2015  . LLQ pain 05/19/2015  . Constipation 05/19/2015  . Nausea & vomiting 05/16/2015  . Malnutrition of moderate degree ([place]) 08/11/2014  . Hematemesis with nausea   . Hematemesis 08/09/2014  . Duodenitis 06/22/2014  . Gastritis 06/17/2014  . Vomiting 06/15/2014    Past Surgical History:  Procedure Laterality Date  . CHOLECYSTECTOMY    . ESOPHAGOGASTRODUODENOSCOPY (EGD) WITH PROPOFOL N/A 06/21/2014   Procedure: ESOPHAGOGASTRODUODENOSCOPY (EGD) WITH PROPOFOL. Looking in the esophagus stomach and upper small intestine with a lighted tube to examine and treat.;  Surgeon: Lollie Sails, MD;  Location: [facility] ENDOSCOPY;  Service: Endoscopy;  Laterality: N/A;    Prior to Admission medications   Medication Sig Start Date End Date Taking? Authorizing Provider  famotidine (PEPCID) 20 MG tablet Take 1 tablet  (20 mg total) by mouth 2 (two) times daily. 10/02/15  Yes Carrie Mew, MD  metoCLOPramide (REGLAN) 10 MG tablet Take 1 tablet (10 mg total) by mouth every 8 (eight) hours as needed. 08/17/15  Yes Loney Hering, MD  ondansetron (ZOFRAN) 4 MG tablet Take 1 tablet (4 mg total) by mouth every 6 (six) hours as needed for nausea. 05/19/15  Yes Theodoro Grist, MD  pantoprazole (PROTONIX) 40 MG tablet Take 1 tablet (40 mg total) by mouth daily. 05/19/15  Yes Theodoro Grist, MD  promethazine (PHENERGAN) 25 MG suppository Place 1 suppository (25 mg total) rectally every 6 (six) hours as needed for nausea. 10/02/15  Yes Carrie Mew, MD  sucralfate (CARAFATE) 1 g tablet Take 1 tablet (1 g total) by mouth 4 (four) times daily -  with meals and at bedtime. 05/19/15  Yes Theodoro Grist, MD    Allergies Penicillins  Family History  Problem Relation Age of Onset  . Diabetes Mother   . Diabetes Father     Social History Social History  Substance Use Topics  . Smoking status: Current Every Day Smoker    Packs/day: 1.50  . Smokeless tobacco: Never Used  . Alcohol use Yes     Comment: 3-4beers/day    Review of Systems Constitutional: No fever/chills Eyes: No visual changes. ENT: No sore throat. Cardiovascular: Denies chest pain. Respiratory: Denies shortness of breath. Gastrointestinal: No diarrhea.  No constipation. Genitourinary: Negative for dysuria. Musculoskeletal: Negative for back pain. Skin: Negative for rash. Neurological: Negative for headaches, focal weakness or numbness.  10-point ROS otherwise negative.  ____________________________________________   PHYSICAL EXAM:  VITAL SIGNS: ED Triage Vitals  Enc Vitals Group     BP 02/24/16 1057 (!) 134/92     Pulse Rate 02/24/16 1057 80     Resp 02/24/16 1230 20     Temp 02/24/16 1057 98 F (36.7 C)     Temp Source 02/24/16 1057 Oral     SpO2 02/24/16 1057 100 %     Weight 02/24/16 1059 140 lb (63.5 kg)     Height 02/24/16 1059  6' (1.829 m)     Head Circumference --      Peak Flow --      Pain Score 02/24/16 1059 7     Pain Loc --      Pain Edu? --      Excl. in [place]? --     Constitutional: Alert and oriented. Tremulous. Actively vomiting in the room. Coffee grounds in the vomitus bag. Eyes: Conjunctivae are normal. PERRL. EOMI. Head: Atraumatic. Nose: No congestion/rhinnorhea. Mouth/Throat: Mucous membranes are moist.   Neck: No stridor.   Cardiovascular: Normal rate, regular rhythm. Grossly normal heart sounds.   Respiratory: Normal respiratory effort.  No retractions. Lungs CTAB. Gastrointestinal: Soft and nontender. No distention.  Musculoskeletal: No lower extremity tenderness nor edema.  No joint effusions. Neurologic:  Normal speech and language. No gross focal neurologic deficits are appreciated.  Skin:  Skin is warm, dry and intact. No rash noted. Psychiatric: Mood and affect are normal. Speech and behavior are normal.  ____________________________________________   LABS (all labs ordered are listed, but only abnormal results are displayed)  Labs Reviewed  COMPREHENSIVE METABOLIC PANEL - Abnormal; Notable for the following:       Result Value   Chloride 95 (*)    Glucose, Bld 145 (*)    Creatinine, Ser 1.28 (*)    Total Protein 8.5 (*)    Albumin 5.1 (*)    Alkaline Phosphatase 131 (*)    Anion gap 16 (*)    All other components within normal limits  CBC - Abnormal; Notable for the following:    WBC 17.2 (*)    All other components within normal limits  LIPASE, BLOOD  URINALYSIS, COMPLETE (UACMP) WITH MICROSCOPIC  TYPE AND SCREEN  TYPE AND SCREEN   ____________________________________________  EKG   ____________________________________________  RADIOLOGY   ____________________________________________   PROCEDURES  Procedure(s) performed:   Procedures  Critical Care performed:   ____________________________________________   INITIAL IMPRESSION / ASSESSMENT AND  PLAN / ED COURSE  Pertinent labs & imaging results that were available during my care of the patient were reviewed by me and considered in my medical decision making (see chart for details).  ----------------------------------------- 1:02 PM on 02/24/2016 -----------------------------------------  Patient with elevated white blood cell count. Diffuse abdominal tenderness which is likely consistent with his cyclic vomiting. To check chest x-ray for signs of any free air. Plan on admission to the hospital for GI bleeding. Signed out to Dr. Anselm Jungling. Patient aware of the plan and willing to comply. Unlikely to be other diagnoses such as appendicitis or colonic issue. Symptoms are consistent with previous visits for cyclic vomiting. Patient understands the plan and is willing to comply with admission. Has not vomited in about 20 minutes at this point. White blood cell count likely from multiple lobes of the vomiting and inflammatory response.      ____________________________________________   FINAL CLINICAL IMPRESSION(S) / ED DIAGNOSES  Acute GI bleeding. Diffuse abdominal pain.    NEW  MEDICATIONS STARTED DURING THIS VISIT:  New Prescriptions   No medications on file     Note:  This document was prepared using Dragon voice recognition software and may include unintentional dictation errors.    Orbie Pyo, MD 02/24/16 [phone]

## 2016-02-24 NOTE — ED Notes (Signed)
Patient states he is unable to void at this time. Patient is aware a urine sample is needed.

## 2016-02-24 NOTE — ED Triage Notes (Addendum)
Arrived via EMS from home with reports of vomiting that started 2 days ago. Pt states he has a hx of cyclic vomiting. Tried to take Phenergan at home, but unable to keep it down.  Pt states he is having abdominal pain and rib pain as well from vomiting. Pt has had multiple episodes of vomiting in the past 24 hours. Every time he tries to sip on something he dry heaves or vomits.  Lives alone. Pt also states that he is weak and fell recently due to his weakness. C/o hands being numb since last night.

## 2016-02-24 NOTE — Progress Notes (Signed)
Pt came to floor at 1450. VSS. Pt was oriented to room and safety plan. Yellow socks are on and bed is in low and locked position with bed alarm on.

## 2016-02-24 NOTE — H&P (Signed)
[place] [facility] [place] NAME: Dakota Bowen    MR#:  [id number]  DATE OF BIRTH:  September 19, 1980  DATE OF ADMISSION:  02/24/2016  PRIMARY CARE PHYSICIAN: Albina Billet, MD   REQUESTING/REFERRING PHYSICIAN: Schaevitz  CHIEF COMPLAINT:   Chief Complaint  Patient presents with  . Emesis    HISTORY OF PRESENT ILLNESS: Dakota Bowen  is a 36 y.o. male with a known history of Cyclical vomiting syndrome, gastroesophageal reflux disease- started vomiting 2 days ago and could not control it. He came to emergency room with significant vomiting and also noted it is dark color with some blood in there. He denies any associated diarrhea or fever or chills. He has generalized abdominal pain which is constant. His hemoglobin is stable, started on IV fluid and given to hospitalist team for further management.  PAST MEDICAL HISTORY:   Past Medical History:  Diagnosis Date  . Cyclical vomiting syndrome   . GERD (gastroesophageal reflux disease)     PAST SURGICAL HISTORY: Past Surgical History:  Procedure Laterality Date  . CHOLECYSTECTOMY    . ESOPHAGOGASTRODUODENOSCOPY (EGD) WITH PROPOFOL N/A 06/21/2014   Procedure: ESOPHAGOGASTRODUODENOSCOPY (EGD) WITH PROPOFOL. Looking in the esophagus stomach and upper small intestine with a lighted tube to examine and treat.;  Surgeon: Lollie Sails, MD;  Location: [facility] ENDOSCOPY;  Service: Endoscopy;  Laterality: N/A;    SOCIAL HISTORY:  Social History  Substance Use Topics  . Smoking status: Current Every Day Smoker    Packs/day: 1.50  . Smokeless tobacco: Never Used  . Alcohol use Yes     Comment: 3-4beers/day    FAMILY HISTORY:  Family History  Problem Relation Age of Onset  . Diabetes Mother   . Diabetes Father     DRUG ALLERGIES:  Penniciline  REVIEW OF SYSTEMS:   CONSTITUTIONAL: No fever, fatigue or weakness.  EYES: No blurred or double vision.  EARS, NOSE, AND THROAT: No tinnitus or ear pain.   RESPIRATORY: No cough, shortness of breath, wheezing or hemoptysis.  CARDIOVASCULAR: No chest pain, orthopnea, edema.  GASTROINTESTINAL: Positive for nausea, vomiting,  abdominal pain. No diarrhea. GENITOURINARY: No dysuria, hematuria.  ENDOCRINE: No polyuria, nocturia,  HEMATOLOGY: No anemia, easy bruising or bleeding SKIN: No rash or lesion. MUSCULOSKELETAL: No joint pain or arthritis.   NEUROLOGIC: No tingling, numbness, weakness.  PSYCHIATRY: No anxiety or depression.   MEDICATIONS AT HOME:  Prior to Admission medications   Medication Sig Start Date End Date Taking? Authorizing Provider  famotidine (PEPCID) 20 MG tablet Take 1 tablet (20 mg total) by mouth 2 (two) times daily. 10/02/15  Yes Carrie Mew, MD  metoCLOPramide (REGLAN) 10 MG tablet Take 1 tablet (10 mg total) by mouth every 8 (eight) hours as needed. 08/17/15  Yes Loney Hering, MD  ondansetron (ZOFRAN) 4 MG tablet Take 1 tablet (4 mg total) by mouth every 6 (six) hours as needed for nausea. 05/19/15  Yes Theodoro Grist, MD  pantoprazole (PROTONIX) 40 MG tablet Take 1 tablet (40 mg total) by mouth daily. 05/19/15  Yes Theodoro Grist, MD  promethazine (PHENERGAN) 25 MG suppository Place 1 suppository (25 mg total) rectally every 6 (six) hours as needed for nausea. 10/02/15  Yes Carrie Mew, MD  sucralfate (CARAFATE) 1 g tablet Take 1 tablet (1 g total) by mouth 4 (four) times daily -  with meals and at bedtime. 05/19/15  Yes Theodoro Grist, MD      PHYSICAL EXAMINATION:   VITAL SIGNS:  Blood pressure 120/88, pulse 80, temperature 98 F (36.7 C), temperature source Oral, resp. rate 20, height 6' (1.829 m), weight 63.5 kg (140 lb), SpO2 100 %.  GENERAL:  36 y.o.-year-old patient lying in the bed with acute distress Due to continuous vomiting.  EYES: Pupils equal, round, reactive to light and accommodation. No scleral icterus. Extraocular muscles intact.  HEENT: Head atraumatic, normocephalic. Oropharynx and nasopharynx  clear.  NECK:  Supple, no jugular venous distention. No thyroid enlargement, no tenderness.  LUNGS: Normal breath sounds bilaterally, no wheezing, rales,rhonchi or crepitation. No use of accessory muscles of respiration.  CARDIOVASCULAR: S1, S2 normal. No murmurs, rubs, or gallops.  ABDOMEN: Soft, mild tender, nondistended. Bowel sounds present. No organomegaly or mass.  EXTREMITIES: No pedal edema, cyanosis, or clubbing.  NEUROLOGIC: Cranial nerves II through XII are intact. Muscle strength 5/5 in all extremities. Sensation intact. Gait not checked.  PSYCHIATRIC: The patient is alert and oriented x 3.  SKIN: No obvious rash, lesion, or ulcer.   LABORATORY PANEL:   CBC  Recent Labs Lab 02/24/16 1101  WBC 17.2*  HGB 17.4  HCT 51.1  PLT 305  MCV 90.8  MCH 30.8  MCHC 34.0  RDW 12.6   ------------------------------------------------------------------------------------------------------------------  Chemistries   Recent Labs Lab 02/24/16 1101  NA 138  K 3.9  CL 95*  CO2 27  GLUCOSE 145*  BUN 17  CREATININE 1.28*  CALCIUM 10.1  AST 37  ALT 50  ALKPHOS 131*  BILITOT 0.8   ------------------------------------------------------------------------------------------------------------------ estimated creatinine clearance is 71 mL/min (by C-G formula based on SCr of 1.28 mg/dL (H)). ------------------------------------------------------------------------------------------------------------------ No results for input(s): TSH, T4TOTAL, T3FREE, THYROIDAB in the last 72 hours.  Invalid input(s): FREET3   Coagulation profile No results for input(s): INR, PROTIME in the last 168 hours. ------------------------------------------------------------------------------------------------------------------- No results for input(s): DDIMER in the last 72 hours. -------------------------------------------------------------------------------------------------------------------  Cardiac  Enzymes No results for input(s): CKMB, TROPONINI, MYOGLOBIN in the last 168 hours.  Invalid input(s): CK ------------------------------------------------------------------------------------------------------------------ Invalid input(s): POCBNP  ---------------------------------------------------------------------------------------------------------------  Urinalysis    Component Value Date/Time   COLORURINE YELLOW (A) 08/16/2015 2037   APPEARANCEUR CLEAR (A) 08/16/2015 2037   APPEARANCEUR Clear 09/23/2013 1902   LABSPEC 1.017 08/16/2015 2037   LABSPEC 1.024 09/23/2013 1902   PHURINE 7.0 08/16/2015 2037   GLUCOSEU 50 (A) 08/16/2015 2037   GLUCOSEU 150 mg/dL 09/23/2013 1902   HGBUR NEGATIVE 08/16/2015 2037   BILIRUBINUR NEGATIVE 08/16/2015 2037   BILIRUBINUR Negative 09/23/2013 1902   KETONESUR TRACE (A) 08/16/2015 2037   PROTEINUR 30 (A) 08/16/2015 2037   NITRITE NEGATIVE 08/16/2015 2037   LEUKOCYTESUR NEGATIVE 08/16/2015 2037   LEUKOCYTESUR Negative 09/23/2013 1902     RADIOLOGY: No results found.  EKG: Orders placed or performed during the hospital encounter of 06/15/14  . ED EKG  . ED EKG  . EKG 12-Lead  . EKG 12-Lead  . EKG    IMPRESSION AND PLAN:  * Cyclical vomiting syndrome   We'll give IV Phenergan and Zofran to control it.   GI consult. Will do urine drug screen.   IV fluids.  * Upper GI bleed   He has coffee-ground colored vomiting.   Likely due to tear secondary to constant vomiting   Hemoglobin is stable currently, get GI consult. Follow hemoglobin.   Will not give anticoagulants at this time  *  active smoking   Counseled to quit smoking for 4 minutes and offered nicotine patch.  All the records are reviewed and case discussed with  ED provider. Management plans discussed with the patient, family and they are in agreement.  CODE STATUS: Full code. Code Status History    Date Active Date Inactive Code Status Order ID Comments User Context    05/16/2015  8:02 AM 05/19/2015  3:44 PM Full Code [id number]  Saundra Shelling, MD Inpatient   08/09/2014 11:57 PM 08/13/2014  6:17 PM Full Code [id number]  Nicholes Mango, MD Inpatient   06/15/2014  2:02 PM 06/22/2014  9:57 PM Full Code [id number]  Hillary Bow, MD ED       TOTAL TIME TAKING CARE OF THIS PATIENT: 45 minutes.    Vaughan Basta M.D on 02/24/2016   Between 7am to 6pm - Pager - [phone]  After 6pm go to [email or web address] - password [email or web address] [place] [facility]  Office  [phone]  CC: Primary care physician; Albina Billet, MD   Note: This dictation was prepared with Dragon dictation along with smaller phrase technology. Any transcriptional errors that result from this process are unintentional.

## 2016-02-24 NOTE — ED Notes (Signed)
Re-draw of Type and Screen sent at this time. Informed lab if this 3rd sample is hemolyzed, they will have to come and draw it.

## 2016-02-25 LAB — CBC
HCT: 42.2 % (ref 40.0–52.0)
HEMOGLOBIN: 14.2 g/dL (ref 13.0–18.0)
MCH: 31 pg (ref 26.0–34.0)
MCHC: 33.7 g/dL (ref 32.0–36.0)
MCV: 92.1 fL (ref 80.0–100.0)
PLATELETS: 228 10*3/uL (ref 150–440)
RBC: 4.58 MIL/uL (ref 4.40–5.90)
RDW: 13.1 % (ref 11.5–14.5)
WBC: 12.2 10*3/uL — AB (ref 3.8–10.6)

## 2016-02-25 LAB — BASIC METABOLIC PANEL
ANION GAP: 6 (ref 5–15)
BUN: 12 mg/dL (ref 6–20)
CHLORIDE: 105 mmol/L (ref 101–111)
CO2: 26 mmol/L (ref 22–32)
CREATININE: 0.8 mg/dL (ref 0.61–1.24)
Calcium: 8.7 mg/dL — ABNORMAL LOW (ref 8.9–10.3)
GFR calc non Af Amer: >60 mL/min (ref >60)
Glucose, Bld: 106 mg/dL — ABNORMAL HIGH (ref 65–99)
Potassium: 3.3 mmol/L — ABNORMAL LOW (ref 3.5–5.1)
Sodium: 137 mmol/L (ref 135–145)

## 2016-02-25 MED ORDER — MORPHINE SULFATE (PF) 2 MG/ML IV SOLN
2.0000 mg | Freq: Once | INTRAVENOUS | Status: AC
  Administered 2016-02-25: 2 mg via INTRAVENOUS
  Filled 2016-02-25: qty 1

## 2016-02-25 MED ORDER — PANTOPRAZOLE SODIUM 40 MG IV SOLR
40.0000 mg | Freq: Two times a day (BID) | INTRAVENOUS | Status: DC
  Administered 2016-02-25 – 2016-02-29 (×10): 40 mg via INTRAVENOUS
  Filled 2016-02-25 (×10): qty 40

## 2016-02-25 MED ORDER — POTASSIUM CHLORIDE CRYS ER 20 MEQ PO TBCR
20.0000 meq | EXTENDED_RELEASE_TABLET | Freq: Two times a day (BID) | ORAL | Status: AC
  Administered 2016-02-25 – 2016-02-27 (×6): 20 meq via ORAL
  Filled 2016-02-25 (×6): qty 1

## 2016-02-25 NOTE — Consult Note (Signed)
Consultation  Referring Provider:     No ref. provider found Primary Care Physician:  Albina Billet, MD Primary Gastroenterologist:  None Reason for Consultation:     Cyclic vomiting syndrome  Date of Admission:  02/24/2016 Date of Consultation:  02/25/2016         HPI:   Dakota Bowen is a 36 y.o. male with a history notable for cyclic vomiting syndrome and recreational drug use who presents for a severe episode of cyclic vomiting.   The patient reports that he has had cyclic vomiting for many years now and gets hospitalized about once every 8 or 9 months with a severe episode. He feels that his current symptoms are consistent with a typical episode. He has never seen an outpatient gastroenterologist for this problem. Since being admitted to the hospital, he feels only a little bit better and continues to feel dehydrated.   Of note, he noted some scant blood in his emesis, which he says he has seen in the past when he vomits a lot.   He denies any drug use for the past 2 weeks.  He denies lightheadedness, dizziness, abdominal pain, diarrhea, constipation or significant weight loss.  Past Medical History:  Diagnosis Date  . Cyclical vomiting syndrome   . GERD (gastroesophageal reflux disease)     Past Surgical History:  Procedure Laterality Date  . CHOLECYSTECTOMY    . ESOPHAGOGASTRODUODENOSCOPY (EGD) WITH PROPOFOL N/A 06/21/2014   Procedure: ESOPHAGOGASTRODUODENOSCOPY (EGD) WITH PROPOFOL. Looking in the esophagus stomach and upper small intestine with a lighted tube to examine and treat.;  Surgeon: Lollie Sails, MD;  Location: [facility] ENDOSCOPY;  Service: Endoscopy;  Laterality: N/A;    Prior to Admission medications   Medication Sig Start Date End Date Taking? Authorizing Provider  famotidine (PEPCID) 20 MG tablet Take 1 tablet (20 mg total) by mouth 2 (two) times daily. 10/02/15  Yes Carrie Mew, MD  metoCLOPramide (REGLAN) 10 MG tablet Take 1 tablet (10 mg total) by  mouth every 8 (eight) hours as needed. 08/17/15  Yes Loney Hering, MD  ondansetron (ZOFRAN) 4 MG tablet Take 1 tablet (4 mg total) by mouth every 6 (six) hours as needed for nausea. 05/19/15  Yes Theodoro Grist, MD  pantoprazole (PROTONIX) 40 MG tablet Take 1 tablet (40 mg total) by mouth daily. 05/19/15  Yes Theodoro Grist, MD  promethazine (PHENERGAN) 25 MG suppository Place 1 suppository (25 mg total) rectally every 6 (six) hours as needed for nausea. 10/02/15  Yes Carrie Mew, MD  sucralfate (CARAFATE) 1 g tablet Take 1 tablet (1 g total) by mouth 4 (four) times daily -  with meals and at bedtime. 05/19/15  Yes Theodoro Grist, MD    Family History  Problem Relation Age of Onset  . Diabetes Mother   . Diabetes Father      Social History  Substance Use Topics  . Smoking status: Current Every Day Smoker    Packs/day: 1.50  . Smokeless tobacco: Never Used  . Alcohol use Yes     Comment: 3-4beers/day    Allergies as of 02/24/2016 - Review Complete 02/24/2016  Allergen Reaction Noted  . Penicillins Itching, Rash, and Other (See Comments) 06/15/2014    Review of Systems:    All systems reviewed and negative except where noted in HPI.   Physical Exam:  Vital signs in last 24 hours: Temp:  [98.1 F (36.7 C)-98.4 F (36.9 C)] 98.4 F (36.9 C) (02/10 0754) Pulse Rate:  [63-80]  63 (02/10 0754) Resp:  [16-20] 16 (02/10 0754) BP: (113-131)/(75-88) 131/88 (02/10 0754) SpO2:  [98 %-100 %] 100 % (02/10 0754) Last BM Date: 02/22/16   General:  Young white male laying listlessly in bed Head:  Normocephalic Eyes:  No scleral icterus icterus appreciated Ears:  Normal auditory acuity. Lungs: no respiratory distress, lungs clear to auscultation bilaterally. Heart:  Regular rate and rhythm, normal S1, S2, without murmurs Abdomen: +BS, soft, nondistended, nontender Rectal: Not indicated Msk:  Symmetrical without gross deformities. Extremities: warm without edema, cyanosis or  clubbing. Neurologic: Alert, grossly normal Skin: Numerous tattoos, no other visible lesions or rashes Psych: Alert, appears fatigued, responding to questions appropriately  LAB RESULTS:  Recent Labs  02/24/16 1101 02/25/16 0528  WBC 17.2* 12.2*  HGB 17.4 14.2  HCT 51.1 42.2  PLT 305 228   BMET  Recent Labs  02/24/16 1101 02/25/16 0528  NA 138 137  K 3.9 3.3*  CL 95* 105  CO2 27 26  GLUCOSE 145* 106*  BUN 17 12  CREATININE 1.28* 0.80  CALCIUM 10.1 8.7*   LFT  Recent Labs  02/24/16 1101  PROT 8.5*  ALBUMIN 5.1*  AST 37  ALT 50  ALKPHOS 131*  BILITOT 0.8   PT/INR No results for input(s): LABPROT, INR in the last 72 hours.  STUDIES: Dg Chest 1 View  Result Date: 02/24/2016 CLINICAL DATA:  Vomiting for 2 days. EXAM: CHEST 1 VIEW COMPARISON:  PA and lateral chest 12/12/2010. FINDINGS: Lungs clear. Heart size normal. No pneumothorax pleural fluid. No bony abnormality. IMPRESSION: Negative chest. Electronically Signed   By: Inge Rise M.D.   On: 02/24/2016 13:26      Impression / Plan:   Dakota Bowen is a 36 y.o. y/o male with a history notable for cyclic vomiting syndrome who presents for a flare. 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 He was re-educated on this. Therefore, there is no treatment at this point other than supportive management. Should he elect to discontinue marijuana and other drugs that could be contaminated with marijuana for at least a year and he continues to have severe CVS, then he will benefit from outpatient gastroenterology consultation for longer term control medications.   The patient's scant blood in his emesis is most likely due to gastritis, esophagitis or a very small mallory-weiss tear in the setting of cyclic vomiting. At this time, since he is hemodynamically stable with a stable hemoglobin (after accounting for correction of dehydration), there is no indication for endoscopy, either inpatient or outpatient.    - continue supportive management with IV anti-emetics - PPI bid  - minimize narcotics to the degree possible as they will certainly exacerbate nausea and emesis - monitor Hg daily - discharge when patient is symptomatically improved  Thank you for involving me in the care of this patient.     LOS: 1 day   Lisbeth Renshaw, MD  02/25/2016, 11:58 AM

## 2016-02-25 NOTE — Progress Notes (Signed)
[place] at [place] NAME: Dakota Bowen    MR#:  [id number]  DATE OF BIRTH:  [birth year]  SUBJECTIVE:   Patient is here due to intractable nausea and vomiting due to cyclical vomiting syndrome. Urine drug screen is positive for marijuana, cocaine. Still having some nausea vomiting today, not tolerating clear liquid diet. Complaining of some vague generalized abdominal pain.  REVIEW OF SYSTEMS:    Review of Systems  Constitutional: Negative for chills and fever.  HENT: Negative for congestion and tinnitus.   Eyes: Negative for blurred vision and double vision.  Respiratory: Negative for cough, shortness of breath and wheezing.   Cardiovascular: Negative for chest pain, orthopnea and PND.  Gastrointestinal: Positive for abdominal pain, nausea and vomiting. Negative for diarrhea.  Genitourinary: Negative for dysuria and hematuria.  Neurological: Negative for dizziness, sensory change and focal weakness.  All other systems reviewed and are negative.   Nutrition: NPO Tolerating Diet: No Tolerating PT: Ambulatory  VITALS:  Blood pressure (!) 125/92, pulse 70, temperature 98.8 F (37.1 C), temperature source Oral, resp. rate 16, height 6' (1.829 m), weight 63.5 kg (140 lb), SpO2 99 %.  PHYSICAL EXAMINATION:   Physical Exam  GENERAL:  36 y.o.-year-old patient lying in bed with in mild GI distress.   EYES: Pupils equal, round, reactive to light and accommodation. No scleral icterus. Extraocular muscles intact.  HEENT: Head atraumatic, normocephalic. Oropharynx and nasopharynx clear.  NECK:  Supple, no jugular venous distention. No thyroid enlargement, no tenderness.  LUNGS: Normal breath sounds bilaterally, no wheezing, rales, rhonchi. No use of accessory muscles of respiration.  CARDIOVASCULAR: S1, S2 normal. No murmurs, rubs, or gallops.  ABDOMEN: Soft, nontender, nondistended. Bowel sounds present. No organomegaly or mass.  EXTREMITIES:  No cyanosis, clubbing or edema b/l.    NEUROLOGIC: Cranial nerves II through XII are intact. No focal Motor or sensory deficits b/l.   PSYCHIATRIC: The patient is alert and oriented x 3.  SKIN: No obvious rash, lesion, or ulcer.    LABORATORY PANEL:   CBC  Recent Labs Lab 02/25/16 0528  WBC 12.2*  HGB 14.2  HCT 42.2  PLT 228   ------------------------------------------------------------------------------------------------------------------  Chemistries   Recent Labs Lab 02/24/16 1101 02/25/16 0528  NA 138 137  K 3.9 3.3*  CL 95* 105  CO2 27 26  GLUCOSE 145* 106*  BUN 17 12  CREATININE 1.28* 0.80  CALCIUM 10.1 8.7*  AST 37  --   ALT 50  --   ALKPHOS 131*  --   BILITOT 0.8  --    ------------------------------------------------------------------------------------------------------------------  Cardiac Enzymes No results for input(s): TROPONINI in the last 168 hours. ------------------------------------------------------------------------------------------------------------------  RADIOLOGY:  Dg Chest 1 View  Result Date: 02/24/2016 CLINICAL DATA:  Vomiting for 2 days. EXAM: CHEST 1 VIEW COMPARISON:  PA and lateral chest 12/12/2010. FINDINGS: Lungs clear. Heart size normal. No pneumothorax pleural fluid. No bony abnormality. IMPRESSION: Negative chest. Electronically Signed   By: Inge Rise M.D.   On: 02/24/2016 13:26     ASSESSMENT AND PLAN:   36 year old male with past medical history of substance abuse, cyclical vomiting syndrome who presented to the hospital due to intractable nausea and vomiting and coffee-ground emesis.  1. Intractable nausea vomiting-secondary to cyclical vomiting syndrome and also due to underlying substance abuse. -Continue supportive care with IV fluids, antibiotics. Seen by gastroenterology and no plan for endoscopic evaluation presently. -Continue PPI IV twice a day.  2. GI bleed-suspected to have  a upper GI bleed given his  coffee-ground emesis. Hemoglobin stable. -This is likely due to underlying gastritis, possible small Mallory-Weiss tear due to the nausea vomiting. -Seen by gastroenterology and no plan for endoscopic evaluation, continue PPI twice a day and supportive care for now.  3. Hypokalemia - due to N/V and will replace and repeat in a.m.     All the records are reviewed and case discussed with Care Management/Social Worker. Management plans discussed with the patient, family and they are in agreement.  CODE STATUS: Full code  DVT Prophylaxis: ambulatory  TOTAL TIME TAKING CARE OF THIS PATIENT: 30 minutes.   POSSIBLE D/C IN 1-2 DAYS, DEPENDING ON CLINICAL CONDITION.   Henreitta Leber M.D on 02/25/2016 at 1:54 PM  Between 7am to 6pm - Pager - [phone]  After 6pm go to [email or web address] - Proofreader  [facility]  Office  [phone]  CC: Primary care physician; Albina Billet, MD

## 2016-02-25 NOTE — Progress Notes (Signed)
Pt refuses bed alarm, pt has fallen in the last 6 months. Pt was educated on the importance of bed alarm and to call nursing staff if pt wants to get up from bed. Will monitor pt closely.   Kyndahl Jablon [employer]

## 2016-02-26 LAB — BASIC METABOLIC PANEL
Anion gap: 7 (ref 5–15)
BUN: 7 mg/dL (ref 6–20)
CALCIUM: 8.8 mg/dL — AB (ref 8.9–10.3)
CO2: 26 mmol/L (ref 22–32)
CREATININE: 0.94 mg/dL (ref 0.61–1.24)
Chloride: 103 mmol/L (ref 101–111)
GFR calc Af Amer: >60 mL/min (ref >60)
GLUCOSE: 102 mg/dL — AB (ref 65–99)
Potassium: 3.6 mmol/L (ref 3.5–5.1)
Sodium: 136 mmol/L (ref 135–145)

## 2016-02-26 MED ORDER — ONDANSETRON HCL 4 MG/2ML IJ SOLN
4.0000 mg | Freq: Four times a day (QID) | INTRAMUSCULAR | Status: DC
  Administered 2016-02-26 – 2016-03-01 (×16): 4 mg via INTRAVENOUS
  Filled 2016-02-26 (×16): qty 2

## 2016-02-26 NOTE — Progress Notes (Signed)
[place] at [place] NAME: Rayhan Abrahams    MR#:  [id number]  DATE OF BIRTH:  August 14, 1980  SUBJECTIVE:   Patient continues to have nausea and vomiting. No further coffee-ground emesis.  REVIEW OF SYSTEMS:    Review of Systems  Constitutional: Negative for chills and fever.  HENT: Negative for congestion and tinnitus.   Eyes: Negative for blurred vision and double vision.  Respiratory: Negative for cough, shortness of breath and wheezing.   Cardiovascular: Negative for chest pain, orthopnea and PND.  Gastrointestinal: Positive for abdominal pain, nausea and vomiting. Negative for diarrhea.  Genitourinary: Negative for dysuria and hematuria.  Neurological: Negative for dizziness, sensory change and focal weakness.  All other systems reviewed and are negative.   Nutrition: Clear liquid Tolerating Diet: some Tolerating PT: Ambulatory  VITALS:  Blood pressure 114/81, pulse 65, temperature 98.5 F (36.9 C), temperature source Oral, resp. rate 16, height 6' (1.829 m), weight 63.5 kg (140 lb), SpO2 98 %.  PHYSICAL EXAMINATION:   Physical Exam  GENERAL:  36 y.o.-year-old patient lying in bed with in mild GI distress.   EYES: Pupils equal, round, reactive to light and accommodation. No scleral icterus. Extraocular muscles intact.  HEENT: Head atraumatic, normocephalic. Oropharynx and nasopharynx clear.  NECK:  Supple, no jugular venous distention. No thyroid enlargement, no tenderness.  LUNGS: Normal breath sounds bilaterally, no wheezing, rales, rhonchi. No use of accessory muscles of respiration.  CARDIOVASCULAR: S1, S2 normal. No murmurs, rubs, or gallops.  ABDOMEN: Soft, nontender, nondistended. Bowel sounds present. No organomegaly or mass.  EXTREMITIES: No cyanosis, clubbing or edema b/l.    NEUROLOGIC: Cranial nerves II through XII are intact. No focal Motor or sensory deficits b/l.   PSYCHIATRIC: The patient is alert and oriented x  3.  SKIN: No obvious rash, lesion, or ulcer.    LABORATORY PANEL:   CBC  Recent Labs Lab 02/25/16 0528  WBC 12.2*  HGB 14.2  HCT 42.2  PLT 228   ------------------------------------------------------------------------------------------------------------------  Chemistries   Recent Labs Lab 02/24/16 1101  02/26/16 0533  NA 138  < > 136  K 3.9  < > 3.6  CL 95*  < > 103  CO2 27  < > 26  GLUCOSE 145*  < > 102*  BUN 17  < > 7  CREATININE 1.28*  < > 0.94  CALCIUM 10.1  < > 8.8*  AST 37  --   --   ALT 50  --   --   ALKPHOS 131*  --   --   BILITOT 0.8  --   --   < > = values in this interval not displayed. ------------------------------------------------------------------------------------------------------------------  Cardiac Enzymes No results for input(s): TROPONINI in the last 168 hours. ------------------------------------------------------------------------------------------------------------------  RADIOLOGY:  No results found.   ASSESSMENT AND PLAN:   36 year old male with past medical history of substance abuse, cyclical vomiting syndrome who presented to the hospital due to intractable nausea and vomiting and coffee-ground emesis.  1. Intractable nausea vomiting-secondary to cyclical vomiting syndrome and also due to underlying substance abuse. -Continue supportive care with IV fluids, anti-emetics. Seen by gastroenterology and no plan for endoscopic evaluation presently. Will make Zofran Scheduled and cont. PRN phenergan. Avoid Narcotics if possible.  -Continue PPI  twice a day.  2. GI bleed-suspected to have a upper GI bleed given his coffee-ground emesis. Hemoglobin stable. -This is likely due to underlying gastritis, possible small Mallory-Weiss tear due to the nausea vomiting. -  Seen by gastroenterology and no plan for endoscopic evaluation, continue PPI twice a day and supportive care for now.  3. Hypokalemia - due to N/V and improving with  supplementation.    All the records are reviewed and case discussed with Care Management/Social Worker. Management plans discussed with the patient, family and they are in agreement.  CODE STATUS: Full code  DVT Prophylaxis: ambulatory  TOTAL TIME TAKING CARE OF THIS PATIENT: 25 minutes.   POSSIBLE D/C IN 1-2 DAYS, DEPENDING ON CLINICAL CONDITION.   Henreitta Leber M.D on 02/26/2016 at 1:41 PM  Between 7am to 6pm - Pager - [phone]  After 6pm go to [email or web address] - Proofreader  [facility]  Office  [phone]  CC: Primary care physician; Albina Billet, MD

## 2016-02-26 NOTE — Progress Notes (Signed)
Patient A/O.  VSS.  Patient still having lots of nausea and some vomiting.  Still having abdominal pain but oral pain medications does help.  Patient has not had a BM since last Tuesday, MD notified and acknowledged but no new orders for that.  Patient voiding adequately.  Only tolerating small amounts of liquids.

## 2016-02-26 NOTE — Plan of Care (Signed)
Problem: Fluid Volume: Goal: Ability to maintain a balanced intake and output will improve Outcome: Not Progressing Patient still nauseated and vomiting after taking in liquids  Problem: Nutrition: Goal: Adequate nutrition will be maintained Outcome: Not Progressing Not tolerating clear liquids well  Problem: Bowel/Gastric: Goal: Will not experience complications related to bowel motility Outcome: Not Progressing Patient has not had a BM since Tuesday

## 2016-02-27 MED ORDER — OXYCODONE-ACETAMINOPHEN 5-325 MG PO TABS
1.0000 | ORAL_TABLET | Freq: Two times a day (BID) | ORAL | Status: DC | PRN
  Administered 2016-02-27 – 2016-03-01 (×5): 1 via ORAL
  Filled 2016-02-27 (×5): qty 1

## 2016-02-27 MED ORDER — SENNOSIDES-DOCUSATE SODIUM 8.6-50 MG PO TABS
1.0000 | ORAL_TABLET | Freq: Every day | ORAL | Status: DC
  Administered 2016-02-27 – 2016-03-01 (×4): 1 via ORAL
  Filled 2016-02-27 (×4): qty 1

## 2016-02-27 MED ORDER — METOCLOPRAMIDE HCL 5 MG/ML IJ SOLN
10.0000 mg | Freq: Four times a day (QID) | INTRAMUSCULAR | Status: DC
  Administered 2016-02-27 – 2016-03-01 (×13): 10 mg via INTRAVENOUS
  Filled 2016-02-27 (×13): qty 2

## 2016-02-27 MED ORDER — POLYETHYLENE GLYCOL 3350 17 G PO PACK: 17.0000 g | PACK | Freq: Every day | ORAL | Status: DC | PRN

## 2016-02-27 NOTE — Progress Notes (Signed)
[place] at [place] NAME: Dakota Bowen    MR#:  [id number]  DATE OF BIRTH:  [birth year]  SUBJECTIVE:   Continues to have N/V.  Not tolerating clear liquids well.  No fever.  Still having some generalized abd. Pain.   REVIEW OF SYSTEMS:    Review of Systems  Constitutional: Negative for chills and fever.  HENT: Negative for congestion and tinnitus.   Eyes: Negative for blurred vision and double vision.  Respiratory: Negative for cough, shortness of breath and wheezing.   Cardiovascular: Negative for chest pain, orthopnea and PND.  Gastrointestinal: Positive for abdominal pain, nausea and vomiting. Negative for diarrhea.  Genitourinary: Negative for dysuria and hematuria.  Neurological: Negative for dizziness, sensory change and focal weakness.  All other systems reviewed and are negative.   Nutrition: NPO except ice chips & meds Tolerating Diet: NO Tolerating PT: Ambulatory  VITALS:  Blood pressure (!) 140/94, pulse 67, temperature 98.7 F (37.1 C), temperature source Oral, resp. rate 14, height 6' (1.829 m), weight 63.5 kg (140 lb), SpO2 100 %.  PHYSICAL EXAMINATION:   Physical Exam  GENERAL:  36 y.o.-year-old patient lying in bed with in mild GI distress.   EYES: Pupils equal, round, reactive to light and accommodation. No scleral icterus. Extraocular muscles intact.  HEENT: Head atraumatic, normocephalic. Oropharynx and nasopharynx clear.  NECK:  Supple, no jugular venous distention. No thyroid enlargement, no tenderness.  LUNGS: Normal breath sounds bilaterally, no wheezing, rales, rhonchi. No use of accessory muscles of respiration.  CARDIOVASCULAR: S1, S2 normal. No murmurs, rubs, or gallops.  ABDOMEN: Soft, Tender diffusely, but no rebound or rigidity, nondistended. Bowel sounds present. No organomegaly or mass.  EXTREMITIES: No cyanosis, clubbing or edema b/l.    NEUROLOGIC: Cranial nerves II through XII are intact. No  focal Motor or sensory deficits b/l.   PSYCHIATRIC: The patient is alert and oriented x 3.  SKIN: No obvious rash, lesion, or ulcer.    LABORATORY PANEL:   CBC  Recent Labs Lab 02/25/16 0528  WBC 12.2*  HGB 14.2  HCT 42.2  PLT 228   ------------------------------------------------------------------------------------------------------------------  Chemistries   Recent Labs Lab 02/24/16 1101  02/26/16 0533  NA 138  < > 136  K 3.9  < > 3.6  CL 95*  < > 103  CO2 27  < > 26  GLUCOSE 145*  < > 102*  BUN 17  < > 7  CREATININE 1.28*  < > 0.94  CALCIUM 10.1  < > 8.8*  AST 37  --   --   ALT 50  --   --   ALKPHOS 131*  --   --   BILITOT 0.8  --   --   < > = values in this interval not displayed. ------------------------------------------------------------------------------------------------------------------  Cardiac Enzymes No results for input(s): TROPONINI in the last 168 hours. ------------------------------------------------------------------------------------------------------------------  RADIOLOGY:  No results found.   ASSESSMENT AND PLAN:   36 year old male with past medical history of substance abuse, cyclical vomiting syndrome who presented to the hospital due to intractable nausea and vomiting and coffee-ground emesis.  1. Intractable nausea vomiting-secondary to cyclical vomiting syndrome and also due to underlying substance abuse. -Continue supportive care with IV fluids, anti-emetics. Seen by gastroenterology and no plan for endoscopic evaluation presently.  -Started on scheduled Zofran yesterday without much improvement. Start on IV Reglan, continue Zofran, continue as needed Phenergan. Continue PPI.  - slow to improve.   2. GI  bleed-suspected to have a upper GI bleed given his coffee-ground emesis. Hemoglobin stable and now ruled out.  -This is likely due to underlying gastritis, possible small Mallory-Weiss tear due to the nausea vomiting. -Seen by  gastroenterology and no plan for endoscopic evaluation, continue PPI twice a day and supportive care for now.  3. Hypokalemia - due to N/V and improving with supplementation.    All the records are reviewed and case discussed with Care Management/Social Worker. Management plans discussed with the patient, family and they are in agreement.  CODE STATUS: Full code  DVT Prophylaxis: ambulatory  TOTAL TIME TAKING CARE OF THIS PATIENT: 25 minutes.   POSSIBLE D/C IN 1-2 DAYS, DEPENDING ON CLINICAL CONDITION.   Henreitta Leber M.D on 02/27/2016 at 3:34 PM  Between 7am to 6pm - Pager - [phone]  After 6pm go to [email or web address] - Proofreader  [facility]  Office  [phone]  CC: Primary care physician; Albina Billet, MD

## 2016-02-27 NOTE — Progress Notes (Signed)
Patient with active emesis. HOB elevated. Too early for PRN med. Comfort measures provided - cool cloth provided and mouth wash to swish and spit. ELQ

## 2016-02-28 DIAGNOSIS — R112 Nausea with vomiting, unspecified: Secondary | ICD-10-CM

## 2016-02-28 LAB — GLUCOSE, CAPILLARY: GLUCOSE-CAPILLARY: 98 mg/dL (ref 65–99)

## 2016-02-28 MED ORDER — KETOROLAC TROMETHAMINE 30 MG/ML IJ SOLN
15.0000 mg | Freq: Once | INTRAMUSCULAR | Status: AC
  Administered 2016-02-28: 15 mg via INTRAVENOUS
  Filled 2016-02-28: qty 1

## 2016-02-28 MED ORDER — PROMETHAZINE HCL 25 MG RE SUPP: 25.0000 mg | Freq: Four times a day (QID) | RECTAL | 0 refills | Status: DC | PRN

## 2016-02-28 MED ORDER — PROMETHAZINE HCL 25 MG PO TABS: 25.0000 mg | ORAL_TABLET | Freq: Four times a day (QID) | ORAL | 0 refills | Status: DC | PRN

## 2016-02-28 NOTE — Progress Notes (Signed)
[place] at [place] NAME: Dakota Bowen    MR#:  [id number]  DATE OF BIRTH:  02/20/1980  SUBJECTIVE:   Started on soft diet this a.m. And tolerated breakfast o.k but after lunch had another episode of vomiting which was consistent with undigested food.  Discussed w/ GI over phone and will make NPO and plan for endoscopy tomorrow.   REVIEW OF SYSTEMS:    Review of Systems  Constitutional: Negative for chills and fever.  HENT: Negative for congestion and tinnitus.   Eyes: Negative for blurred vision and double vision.  Respiratory: Negative for cough, shortness of breath and wheezing.   Cardiovascular: Negative for chest pain, orthopnea and PND.  Gastrointestinal: Positive for abdominal pain, nausea and vomiting. Negative for diarrhea.  Genitourinary: Negative for dysuria and hematuria.  Neurological: Negative for dizziness, sensory change and focal weakness.  All other systems reviewed and are negative.   Nutrition: NPO except ice chips & meds Tolerating Diet: NO Tolerating PT: Ambulatory  VITALS:  Blood pressure 111/67, pulse (!) 109, temperature 98.4 F (36.9 C), temperature source Oral, resp. rate 14, height 6' (1.829 m), weight 63.5 kg (140 lb), SpO2 100 %.  PHYSICAL EXAMINATION:   Physical Exam  GENERAL:  36 y.o.-year-old patient lying in bed with in mild GI distress.   EYES: Pupils equal, round, reactive to light and accommodation. No scleral icterus. Extraocular muscles intact.  HEENT: Head atraumatic, normocephalic. Oropharynx and nasopharynx clear.  NECK:  Supple, no jugular venous distention. No thyroid enlargement, no tenderness.  LUNGS: Normal breath sounds bilaterally, no wheezing, rales, rhonchi. No use of accessory muscles of respiration.  CARDIOVASCULAR: S1, S2 normal. No murmurs, rubs, or gallops.  ABDOMEN: Soft, Tender diffusely, but no rebound or rigidity, nondistended. Bowel sounds present. No organomegaly or  mass.  EXTREMITIES: No cyanosis, clubbing or edema b/l.    NEUROLOGIC: Cranial nerves II through XII are intact. No focal Motor or sensory deficits b/l.   PSYCHIATRIC: The patient is alert and oriented x 3.  SKIN: No obvious rash, lesion, or ulcer.    LABORATORY PANEL:   CBC  Recent Labs Lab 02/25/16 0528  WBC 12.2*  HGB 14.2  HCT 42.2  PLT 228   ------------------------------------------------------------------------------------------------------------------  Chemistries   Recent Labs Lab 02/24/16 1101  02/26/16 0533  NA 138  < > 136  K 3.9  < > 3.6  CL 95*  < > 103  CO2 27  < > 26  GLUCOSE 145*  < > 102*  BUN 17  < > 7  CREATININE 1.28*  < > 0.94  CALCIUM 10.1  < > 8.8*  AST 37  --   --   ALT 50  --   --   ALKPHOS 131*  --   --   BILITOT 0.8  --   --   < > = values in this interval not displayed. ------------------------------------------------------------------------------------------------------------------  Cardiac Enzymes No results for input(s): TROPONINI in the last 168 hours. ------------------------------------------------------------------------------------------------------------------  RADIOLOGY:  No results found.   ASSESSMENT AND PLAN:   36 year old male with past medical history of substance abuse, cyclical vomiting syndrome who presented to the hospital due to intractable nausea and vomiting and coffee-ground emesis.  1. Intractable nausea vomiting-secondary to cyclical vomiting syndrome and also due to underlying substance abuse. -Despite being on scheduled and I antiemetics including Zofran and Reglan, IV fluids patient has not improved and continues to have nausea and vomiting. -Discussed with gastroenterology today  over the phone and plan for upper GI endoscopy tomorrow. -Continue IV Reglan, Zofran, Phenergan.  Avoid Narcotics.   2. GI bleed-suspected to have a upper GI bleed given his coffee-ground emesis. Hemoglobin stable and now  ruled out.  -This is likely due to underlying gastritis, possible small Mallory-Weiss tear due to the nausea vomiting. - pt. Continues to have N/V and discussed w/ GI and plan for endoscopy tomorrow. NPO except ice chips and meds for now.   3. Hypokalemia - due to N/V and resolved with supplementation.    All the records are reviewed and case discussed with Care Management/Social Worker. Management plans discussed with the patient, family and they are in agreement.  CODE STATUS: Full code  DVT Prophylaxis: ambulatory  TOTAL TIME TAKING CARE OF THIS PATIENT: 30 minutes.   POSSIBLE D/C IN 1-2 DAYS, DEPENDING ON CLINICAL CONDITION.   Henreitta Leber M.D on 02/28/2016 at 2:55 PM  Between 7am to 6pm - Pager - [phone]  After 6pm go to [email or web address] - Proofreader  [facility]  Office  [phone]  CC: Primary care physician; Albina Billet, MD

## 2016-02-28 NOTE — Plan of Care (Signed)
Problem: Fluid Volume: Goal: Ability to maintain a balanced intake and output will improve Outcome: Not Progressing Patient is NPO except for ice chips.  Problem: Nutrition: Goal: Adequate nutrition will be maintained Outcome: Not Progressing Patient is NPO except for ice chips.  Problem: Bowel/Gastric: Goal: Will not experience complications related to bowel motility Outcome: Progressing Patient is currently on stool softeners and miralax.

## 2016-02-28 NOTE — Discharge Instructions (Signed)

## 2016-02-28 NOTE — Plan of Care (Signed)
Problem: Nutrition: Goal: Adequate nutrition will be maintained Outcome: Progressing Pt tolerated a soft diet for breakfast.

## 2016-02-28 NOTE — Care Management (Signed)
Patient to discharge home today.  Patient listed as self pay patient.  Patient in shower at this time.  PCP listed as [facility].  Application for [facility] as additional resource.  Patient to discharge on phenergan.  RNCM signing off.

## 2016-02-28 NOTE — Progress Notes (Signed)
Per MD, D/C discharge order.

## 2016-02-28 NOTE — Progress Notes (Signed)
  Jonathon Bellows MD [address], [place], [place] [address] Phone: [phone] Fax : [phone]  Dakota Bowen is being followed for vomiting  Day 4 of follow up   Subjective: Was doing well but when started eating again started to throw up .  He says he had a migraine headache last night , feels better when he takes a hot shower.   Denies any exposure to marijuana in the hospital.    Objective: Vital signs in last 24 hours: Vitals:   02/27/16 1441 02/27/16 2011 02/28/16 0550 02/28/16 1355  BP: (!) 140/94 108/73 100/67 111/67  Pulse: 67 66 62 (!) 109  Resp: 14 17 18 14   Temp: 98.7 F (37.1 C) 97.9 F (36.6 C) 97.8 F (36.6 C) 98.4 F (36.9 C)  TempSrc: Oral Oral Oral Oral  SpO2: 100% 100% 98% 100%  Weight:      Height:       Weight change:   Intake/Output Summary (Last 24 hours) at 02/28/16 1611 Last data filed at 02/28/16 1522  Gross per 24 hour  Intake             3016 ml  Output             1025 ml  Net             1991 ml     Exam: Heart:: Regular rate and rhythm, S1S2 present or without murmur or extra heart sounds Lungs: normal and clear to auscultation Abdomen: soft, nontender, normal bowel sounds   Lab Results:  Micro Results: No results found for this or any previous visit (from the past 240 hour(s)). Studies/Results: No results found. Medications: I have reviewed the patient's current medications. Scheduled Meds: . metoCLOPramide (REGLAN) injection  10 mg Intravenous Q6H  . ondansetron (ZOFRAN) IV  4 mg Intravenous Q6H  . pantoprazole (PROTONIX) IV  40 mg Intravenous Q12H  . senna-docusate  1 tablet Oral Daily   Continuous Infusions: . sodium chloride 125 mL/hr at 02/28/16 1413   PRN Meds:.oxyCODONE-acetaminophen, polyethylene glycol, promethazine   Assessment: Principal Problem:   Intractable vomiting Active Problems:   Hematemesis   Dakota Bowen 36 y.o. male admitted vomiting with a urine tox positive for maijuana  and cocaine. He is still throwing up. 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 He is already on maxium dose of Reglan, Zofran as well as PRN phenergan.   Plan: 1. EGD contraindicated due to recent cocaine use  2. UGI series tomorrow  3. Clear liquid diet for now.  4. As an outpatient he would need to stop all drug use, need migraine prophylaxsis , can try Coenzyme Q for Cyclical vomiting syndrome which is an over the counter medication and has had shown  some good benefit with this condition.    LOS: 4 days   Jonathon Bellows 02/28/2016, 4:11 PM

## 2016-02-29 MED ORDER — ACETAMINOPHEN 325 MG PO TABS
650.0000 mg | ORAL_TABLET | Freq: Four times a day (QID) | ORAL | Status: DC | PRN
  Administered 2016-02-29: 650 mg via ORAL
  Filled 2016-02-29: qty 2

## 2016-02-29 NOTE — Progress Notes (Signed)
[facility] NAME: Dakota Bowen    MR#:  [id number]  DATE OF BIRTH:  11-22-1980  SUBJECTIVE:   Scheduled for UGI series this a.m. But cancelled as patient drank some coffee this a.m. No vomiting this a.m.    REVIEW OF SYSTEMS:    Review of Systems  Constitutional: Negative for chills and fever.  HENT: Negative for congestion and tinnitus.   Eyes: Negative for blurred vision and double vision.  Respiratory: Negative for cough, shortness of breath and wheezing.   Cardiovascular: Negative for chest pain, orthopnea and PND.  Gastrointestinal: Positive for abdominal pain, nausea and vomiting. Negative for diarrhea.  Genitourinary: Negative for dysuria and hematuria.  Neurological: Negative for dizziness, sensory change and focal weakness.  All other systems reviewed and are negative.   Nutrition: Clear liquids Tolerating Diet: Yes Tolerating PT: Ambulatory  VITALS:  Blood pressure 106/82, pulse 65, temperature 97.7 F (36.5 C), temperature source Oral, resp. rate 16, height 6' (1.829 m), weight 63.5 kg (140 lb), SpO2 100 %.  PHYSICAL EXAMINATION:   Physical Exam  GENERAL:  36 y.o.-year-old patient lying in bed with in mild GI distress.   EYES: Pupils equal, round, reactive to light and accommodation. No scleral icterus. Extraocular muscles intact.  HEENT: Head atraumatic, normocephalic. Oropharynx and nasopharynx clear.  NECK:  Supple, no jugular venous distention. No thyroid enlargement, no tenderness.  LUNGS: Normal breath sounds bilaterally, no wheezing, rales, rhonchi. No use of accessory muscles of respiration.  CARDIOVASCULAR: S1, S2 normal. No murmurs, rubs, or gallops.  ABDOMEN: Soft, Tender diffusely, but no rebound or rigidity, nondistended. Bowel sounds present. No organomegaly or mass.  EXTREMITIES: No cyanosis, clubbing or edema b/l.    NEUROLOGIC: Cranial nerves II through XII are intact. No focal Motor or  sensory deficits b/l.   PSYCHIATRIC: The patient is alert and oriented x 3.  SKIN: No obvious rash, lesion, or ulcer.    LABORATORY PANEL:   CBC  Recent Labs Lab 02/25/16 0528  WBC 12.2*  HGB 14.2  HCT 42.2  PLT 228   ------------------------------------------------------------------------------------------------------------------  Chemistries   Recent Labs Lab 02/24/16 1101  02/26/16 0533  NA 138  < > 136  K 3.9  < > 3.6  CL 95*  < > 103  CO2 27  < > 26  GLUCOSE 145*  < > 102*  BUN 17  < > 7  CREATININE 1.28*  < > 0.94  CALCIUM 10.1  < > 8.8*  AST 37  --   --   ALT 50  --   --   ALKPHOS 131*  --   --   BILITOT 0.8  --   --   < > = values in this interval not displayed. ------------------------------------------------------------------------------------------------------------------  Cardiac Enzymes No results for input(s): TROPONINI in the last 168 hours. ------------------------------------------------------------------------------------------------------------------  RADIOLOGY:  No results found.   ASSESSMENT AND PLAN:   36 year old male with past medical history of substance abuse, cyclical vomiting syndrome who presented to the hospital due to intractable nausea and vomiting and coffee-ground emesis.  1. Intractable nausea vomiting-secondary to cyclical vomiting syndrome and also due to underlying substance abuse. -cont. Scheduled Reglan, Zofran.   -Discussed with gastroenterology and plan for UGI series tomorrow a.m  -Avoid Narcotics.   2. GI bleed-suspected to have a upper GI bleed given his coffee-ground emesis. Hemoglobin stable and now ruled out.  -This is likely due to underlying gastritis, possible small Mallory-Weiss tear  due to nausea vomiting. - pt. Continues to have N/V and discussed w/ GI and plan for UGI series tomorrow.   3. Hypokalemia - due to N/V and resolved with supplementation.   Possible d/c home tomorrow if Upper GI series is  (-).   All the records are reviewed and case discussed with Care Management/Social Worker. Management plans discussed with the patient, family and they are in agreement.  CODE STATUS: Full code  DVT Prophylaxis: ambulatory  TOTAL TIME TAKING CARE OF THIS PATIENT: 30 minutes.   POSSIBLE D/C IN 1-2 DAYS, DEPENDING ON CLINICAL CONDITION.   Henreitta Leber M.D on 02/29/2016 at 1:53 PM  Between 7am to 6pm - Pager - [phone]  After 6pm go to [email or web address] - Proofreader  [facility]  Office  [phone]  CC: Primary care physician; Albina Billet, MD

## 2016-03-01 ENCOUNTER — Inpatient Hospital Stay: Payer: Self-pay

## 2016-03-01 DIAGNOSIS — R1110 Vomiting, unspecified: Secondary | ICD-10-CM

## 2016-03-01 MED ORDER — PANTOPRAZOLE SODIUM 40 MG PO TBEC
40.0000 mg | DELAYED_RELEASE_TABLET | Freq: Two times a day (BID) | ORAL | Status: DC
  Administered 2016-03-01: 40 mg via ORAL
  Filled 2016-03-01: qty 1

## 2016-03-01 NOTE — Progress Notes (Signed)
Jonathon Bellows MD [address], [place], [place] [address] Phone: [phone] Fax : [phone]  Dakota Bowen is being followed for vomiting  Day 6 of follow up   Subjective: Feels much better, wants to go home. No vomiting   Objective: Vital signs in last 24 hours: Vitals:   02/29/16 0850 02/29/16 0921 02/29/16 2025 03/01/16 0509  BP: 103/78 106/82 120/79 115/79  Pulse: 65 65 85 60  Resp: 18 16 19 17   Temp: 97.6 F (36.4 C) 97.7 F (36.5 C) 98.6 F (37 C) 97.7 F (36.5 C)  TempSrc: Oral Oral Oral Oral  SpO2: 100% 100% 100% 100%  Weight:      Height:       Weight change:   Intake/Output Summary (Last 24 hours) at 03/01/16 1139 Last data filed at 03/01/16 0500  Gross per 24 hour  Intake          2876.33 ml  Output              750 ml  Net          2126.33 ml     Exam: Heart:: Regular rate and rhythm, S1S2 present or without murmur or extra heart sounds Lungs: normal, clear to auscultation and clear to auscultation and percussion Abdomen: soft, nontender, normal bowel sounds   Lab Results: Micro Results: No results found for this or any previous visit (from the past 240 hour(s)). Studies/Results: Dg Ugi W/small Bowel  Result Date: 03/01/2016 CLINICAL DATA:  Abdominal pain, rib pain EXAM: UPPER GI SERIES WITH SMALL BOWEL FOLLOW-THROUGH FLUOROSCOPY TIME:  Fluoroscopy Time:  1.3 minute Radiation Exposure Index (if provided by the fluoroscopic device): 19.6 mGy Number of Acquired Spot Images: 5 TECHNIQUE: Combined double contrast and single contrast upper GI series using effervescent crystals, thick barium, and thin barium. Subsequently, serial images of the small bowel were obtained including spot views of the terminal ileum. COMPARISON:  None. FINDINGS: Examination of the esophagus demonstrated normal esophageal motility. Normal esophageal morphology without evidence of esophagitis or ulceration. No esophageal stricture, diverticula, or mass lesion. No  evidence of hiatal hernia. There is no spontaneous or inducible gastroesophageal reflux. Examination of the stomach demonstrated thickening of the rugal folds as can be seen with gastritis The gastric mucosa appeared unremarkable without evidence of ulceration, scarring, or mass lesion. Gastric motility and emptying was normal. Fluoroscopic examination of the duodenum demonstrates normal motility and morphology without evidence of ulceration or mass lesion. Medium density barium was periodically observed under fluoroscopy to travel from the stomach to the ascending colon (over a 90 minute time period). There is no evidence of small bowel stricture or obstruction. No large filling defects to suggest mass lesion. In addition, there is no evidence of tethering or definite inflammatory changes present within the small bowel. IMPRESSION: 1. Thickening of the rugal fold as can be seen with mild gastritis. 2. Normal small bowel follow-through. Electronically Signed   By: Kathreen Devoid   On: 03/01/2016 11:08   Medications: I have reviewed the patient's current medications. Scheduled Meds: . metoCLOPramide (REGLAN) injection  10 mg Intravenous Q6H  . ondansetron (ZOFRAN) IV  4 mg Intravenous Q6H  . pantoprazole  40 mg Oral BID  . senna-docusate  1 tablet Oral Daily   Continuous Infusions: . sodium chloride 125 mL/hr at 03/01/16 0058   PRN Meds:.acetaminophen, oxyCODONE-acetaminophen, polyethylene glycol, promethazine   Assessment: Principal Problem:   Intractable vomiting Active Problems:   Hematemesis  Dakota Bowen 36  y.o. male admitted vomiting with a urine tox positive for maijuana and cocaine. He is still throwing up. 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  - If has recurrence off all illegal drug use then can perform EGD as an outpatient  I will sign off.  Please call me if any further GI concerns or questions.  We would like to thank you for the opportunity to participate in the care of KILYN PINGLETON.    LOS: 6 days   Jonathon Bellows 03/01/2016, 11:39 AM

## 2016-03-01 NOTE — Progress Notes (Signed)
Patient was alert and oriented. Discharge instructions given. Patient verbalized understanding.

## 2016-03-01 NOTE — Discharge Summary (Signed)
[facility] NAME: Dakota Bowen    MR#:  [id number]  DATE OF BIRTH:  [birth year]  DATE OF ADMISSION:  02/24/2016 ADMITTING PHYSICIAN: Vaughan Basta, MD  DATE OF DISCHARGE: 03/01/2016  1:40 PM  PRIMARY CARE PHYSICIAN: Albina Billet, MD    ADMISSION DIAGNOSIS:  Generalized abdominal pain [R10.84] Gastrointestinal hemorrhage, unspecified gastrointestinal hemorrhage type [K92.2]  DISCHARGE DIAGNOSIS:  Principal Problem:   Intractable vomiting Active Problems:   Hematemesis   SECONDARY DIAGNOSIS:   Past Medical History:  Diagnosis Date  . Cyclical vomiting syndrome   . GERD (gastroesophageal reflux disease)     HOSPITAL COURSE:   36 year old male with past medical history of substance abuse, cyclical vomiting syndrome who presented to the hospital due to intractable nausea and vomiting and coffee-ground emesis.  1. Intractable nausea vomiting-secondary to cyclical vomiting syndrome and also due to underlying substance abuse. -Patient was treated supportively with IV fluids, scheduled IV Zofran and Reglan. He was so to improve and therefore gastroenterology consult was obtained. Patient underwent an upper GI series which showed evidence of gastritis without any evidence of obstruction. -With supportive care patient's nausea vomiting has significantly improved since admission. He is currently tolerating a regular diet well without any further nausea and vomiting. He is being discharged on oral antibiotics with Phenergan.Marland Kitchen He will also continue omeprazole twice daily given his gastritis. -She was strongly advised to abstain from marijuana abuse and also narcotics.  2. GI bleed-suspected to have a upper GI bleed given his coffee-ground emesis admission.  -This is likely due to underlying gastritis, possible small Mallory-Weiss tear due to nausea vomiting. -Patient with likely benefit from an upper GI endoscopy as an outpatient  as his hemoglobin has remained fairly stable in the hospital.  Cont. PPI BID.    3. Hypokalemia - due to N/V and resolved with supplementation.   DISCHARGE CONDITIONS:   Stable  CONSULTS OBTAINED:    DRUG ALLERGIES:    DISCHARGE MEDICATIONS:   Current Meds  Medication Sig  . famotidine (PEPCID) 20 MG tablet Take 1 tablet (20 mg total) by mouth 2 (two) times daily.  . metoCLOPramide (REGLAN) 10 MG tablet Take 1 tablet (10 mg total) by mouth every 8 (eight) hours as needed.  . ondansetron (ZOFRAN) 4 MG tablet Take 1 tablet (4 mg total) by mouth every 6 (six) hours as needed for nausea.  . pantoprazole (PROTONIX) 40 MG tablet Take 1 tablet (40 mg total) by mouth daily.  . sucralfate (CARAFATE) 1 g tablet Take 1 tablet (1 g total) by mouth 4 (four) times daily -  with meals and at bedtime.  . [DISCONTINUED] promethazine (PHENERGAN) 25 MG suppository Place 1 suppository (25 mg total) rectally every 6 (six) hours as needed for nausea.       DISCHARGE INSTRUCTIONS:   DIET:  Regular diet  DISCHARGE CONDITION:  Stable  ACTIVITY:  Activity as tolerated  OXYGEN:  Home Oxygen: No.   Oxygen Delivery: room air  DISCHARGE LOCATION:  home   If you experience worsening of your admission symptoms, develop shortness of breath, life threatening emergency, suicidal or homicidal thoughts you must seek medical attention immediately by calling 911 or calling your MD immediately  if symptoms less severe.  You Must read complete instructions/literature along with all the possible adverse reactions/side effects for all the Medicines you take and that have been prescribed to you. Take any new Medicines after you have completely understood and accpet all  the possible adverse reactions/side effects.   Please note  You were cared for by a hospitalist during your hospital stay. If you have any questions about your discharge medications or the care you received while you were in the hospital  after you are discharged, you can call the unit and asked to speak with the hospitalist on call if the hospitalist that took care of you is not available. Once you are discharged, your primary care physician will handle any further medical issues. Please note that NO REFILLS for any discharge medications will be authorized once you are discharged, as it is imperative that you return to your primary care physician (or establish a relationship with a primary care physician if you do not have one) for your aftercare needs so that they can reassess your need for medications and monitor your lab values.     Today   UGI series (-) for obstruction this a.m.  No N/V this a.m. Tolerating PO well. Will d/c home today.   VITAL SIGNS:  Blood pressure 115/79, pulse 60, temperature 97.7 F (36.5 C), temperature source Oral, resp. rate 17, height 6' (1.829 m), weight 63.5 kg (140 lb), SpO2 100 %.  I/O:   Intake/Output Summary (Last 24 hours) at 03/01/16 1458 Last data filed at 03/01/16 1338  Gross per 24 hour  Intake          2611.33 ml  Output             1100 ml  Net          1511.33 ml    PHYSICAL EXAMINATION:  GENERAL:  36 y.o.-year-old patient lying in the bed with no acute distress.  EYES: Pupils equal, round, reactive to light and accommodation. No scleral icterus. Extraocular muscles intact.  HEENT: Head atraumatic, normocephalic. Oropharynx and nasopharynx clear.  NECK:  Supple, no jugular venous distention. No thyroid enlargement, no tenderness.  LUNGS: Normal breath sounds bilaterally, no wheezing, rales,rhonchi. No use of accessory muscles of respiration.  CARDIOVASCULAR: S1, S2 normal. No murmurs, rubs, or gallops.  ABDOMEN: Soft, non-tender, non-distended. Bowel sounds present. No organomegaly or mass.  EXTREMITIES: No pedal edema, cyanosis, or clubbing.  NEUROLOGIC: Cranial nerves II through XII are intact. No focal motor or sensory defecits b/l.  PSYCHIATRIC: The patient is alert  and oriented x 3. Good affect.  SKIN: No obvious rash, lesion, or ulcer.   DATA REVIEW:   CBC  Recent Labs Lab 02/25/16 0528  WBC 12.2*  HGB 14.2  HCT 42.2  PLT 228    Chemistries   Recent Labs Lab 02/24/16 1101  02/26/16 0533  NA 138  < > 136  K 3.9  < > 3.6  CL 95*  < > 103  CO2 27  < > 26  GLUCOSE 145*  < > 102*  BUN 17  < > 7  CREATININE 1.28*  < > 0.94  CALCIUM 10.1  < > 8.8*  AST 37  --   --   ALT 50  --   --   ALKPHOS 131*  --   --   BILITOT 0.8  --   --   < > = values in this interval not displayed.  Cardiac Enzymes No results for input(s): TROPONINI in the last 168 hours.   RADIOLOGY:  Dg Ugi W/small Bowel  Result Date: 03/01/2016 CLINICAL DATA:  Abdominal pain, rib pain EXAM: UPPER GI SERIES WITH SMALL BOWEL FOLLOW-THROUGH FLUOROSCOPY TIME:  Fluoroscopy Time:  1.3 minute Radiation  Exposure Index (if provided by the fluoroscopic device): 19.6 mGy Number of Acquired Spot Images: 5 TECHNIQUE: Combined double contrast and single contrast upper GI series using effervescent crystals, thick barium, and thin barium. Subsequently, serial images of the small bowel were obtained including spot views of the terminal ileum. COMPARISON:  None. FINDINGS: Examination of the esophagus demonstrated normal esophageal motility. Normal esophageal morphology without evidence of esophagitis or ulceration. No esophageal stricture, diverticula, or mass lesion. No evidence of hiatal hernia. There is no spontaneous or inducible gastroesophageal reflux. Examination of the stomach demonstrated thickening of the rugal folds as can be seen with gastritis The gastric mucosa appeared unremarkable without evidence of ulceration, scarring, or mass lesion. Gastric motility and emptying was normal. Fluoroscopic examination of the duodenum demonstrates normal motility and morphology without evidence of ulceration or mass lesion. Medium density barium was periodically observed under fluoroscopy to  travel from the stomach to the ascending colon (over a 90 minute time period). There is no evidence of small bowel stricture or obstruction. No large filling defects to suggest mass lesion. In addition, there is no evidence of tethering or definite inflammatory changes present within the small bowel. IMPRESSION: 1. Thickening of the rugal fold as can be seen with mild gastritis. 2. Normal small bowel follow-through. Electronically Signed   By: Kathreen Devoid   On: 03/01/2016 11:08      Management plans discussed with the patient, family and they are in agreement.  CODE STATUS:     Code Status Orders        Start     Ordered   02/24/16 1453  Full code  Continuous     02/24/16 1452    Code Status History    Date Active Date Inactive Code Status Order ID Comments User Context   05/16/2015  8:02 AM 05/19/2015  3:44 PM Full Code [id number]  Saundra Shelling, MD Inpatient   08/09/2014 11:57 PM 08/13/2014  6:17 PM Full Code [id number]  Nicholes Mango, MD Inpatient   06/15/2014  2:02 PM 06/22/2014  9:57 PM Full Code [id number]  Hillary Bow, MD ED      TOTAL TIME TAKING CARE OF THIS PATIENT: 40 minutes.    Henreitta Leber M.D on 03/01/2016 at 2:58 PM  Between 7am to 6pm - Pager - [phone]  After 6pm go to [email or web address] - Proofreader  [facility]  Office  [phone]  CC: Primary care physician; Albina Billet, MD

## 2016-09-13 ENCOUNTER — Emergency Department
Admission: EM | Admit: 2016-09-13 | Discharge: 2016-09-13 | Disposition: A | Discharge status: Home / Self Care | Payer: Self-pay | Attending: Emergency Medicine | Admitting: Emergency Medicine

## 2016-09-13 ENCOUNTER — Encounter: Payer: Self-pay | Admitting: Emergency Medicine

## 2016-09-13 ENCOUNTER — Emergency Department: Payer: Self-pay

## 2016-09-13 DIAGNOSIS — F1410 Cocaine abuse, uncomplicated: Secondary | ICD-10-CM | POA: Insufficient documentation

## 2016-09-13 DIAGNOSIS — R112 Nausea with vomiting, unspecified: Secondary | ICD-10-CM | POA: Insufficient documentation

## 2016-09-13 DIAGNOSIS — F17210 Nicotine dependence, cigarettes, uncomplicated: Secondary | ICD-10-CM | POA: Insufficient documentation

## 2016-09-13 DIAGNOSIS — K2950 Unspecified chronic gastritis without bleeding: Secondary | ICD-10-CM | POA: Insufficient documentation

## 2016-09-13 DIAGNOSIS — Z79899 Other long term (current) drug therapy: Secondary | ICD-10-CM | POA: Insufficient documentation

## 2016-09-13 LAB — COMPREHENSIVE METABOLIC PANEL
ALK PHOS: 62 U/L (ref 38–126)
ALT: 33 U/L (ref 17–63)
ANION GAP: 12 (ref 5–15)
AST: 29 U/L (ref 15–41)
Albumin: 4.5 g/dL (ref 3.5–5.0)
BUN: 13 mg/dL (ref 6–20)
CALCIUM: 9.1 mg/dL (ref 8.9–10.3)
CO2: 23 mmol/L (ref 22–32)
Chloride: 106 mmol/L (ref 101–111)
Creatinine, Ser: 1.04 mg/dL (ref 0.61–1.24)
GFR calc non Af Amer: >60 mL/min (ref >60)
GLUCOSE: 143 mg/dL — AB (ref 65–99)
Potassium: 3.9 mmol/L (ref 3.5–5.1)
Sodium: 141 mmol/L (ref 135–145)
Total Bilirubin: 1.1 mg/dL (ref 0.3–1.2)
Total Protein: 7.3 g/dL (ref 6.5–8.1)

## 2016-09-13 LAB — CBC
HEMATOCRIT: 41.5 % (ref 40.0–52.0)
HEMOGLOBIN: 14.8 g/dL (ref 13.0–18.0)
MCH: 32.6 pg (ref 26.0–34.0)
MCHC: 35.6 g/dL (ref 32.0–36.0)
MCV: 91.4 fL (ref 80.0–100.0)
Platelets: 260 10*3/uL (ref 150–440)
RBC: 4.54 MIL/uL (ref 4.40–5.90)
RDW: 14.3 % (ref 11.5–14.5)
WBC: 16.4 10*3/uL — ABNORMAL HIGH (ref 3.8–10.6)

## 2016-09-13 MED ORDER — SODIUM CHLORIDE 0.9 % IV BOLUS (SEPSIS)
1000.0000 mL | Freq: Once | INTRAVENOUS | Status: AC
  Administered 2016-09-13: 1000 mL via INTRAVENOUS

## 2016-09-13 MED ORDER — PANTOPRAZOLE SODIUM 40 MG PO TBEC: 40.0000 mg | DELAYED_RELEASE_TABLET | Freq: Every day | ORAL | 0 refills | Status: DC

## 2016-09-13 MED ORDER — KETOROLAC TROMETHAMINE 30 MG/ML IJ SOLN
30.0000 mg | Freq: Once | INTRAMUSCULAR | Status: AC
  Administered 2016-09-13: 30 mg via INTRAVENOUS
  Filled 2016-09-13: qty 1

## 2016-09-13 MED ORDER — HALOPERIDOL LACTATE 5 MG/ML IJ SOLN
2.0000 mg | Freq: Once | INTRAMUSCULAR | Status: AC
  Administered 2016-09-13: 2 mg via INTRAVENOUS
  Filled 2016-09-13: qty 1

## 2016-09-13 MED ORDER — PROCHLORPERAZINE EDISYLATE 5 MG/ML IJ SOLN
10.0000 mg | Freq: Once | INTRAMUSCULAR | Status: AC
  Administered 2016-09-13: 10 mg via INTRAVENOUS
  Filled 2016-09-13: qty 2

## 2016-09-13 MED ORDER — ONDANSETRON 4 MG PO TBDP: 4.0000 mg | ORAL_TABLET | Freq: Three times a day (TID) | ORAL | 0 refills | Status: DC | PRN

## 2016-09-13 MED ORDER — IOPAMIDOL (ISOVUE-300) INJECTION 61%
100.0000 mL | Freq: Once | INTRAVENOUS | Status: AC | PRN
  Administered 2016-09-13: 100 mL via INTRAVENOUS

## 2016-09-13 MED ORDER — DIPHENHYDRAMINE HCL 50 MG/ML IJ SOLN
12.5000 mg | Freq: Once | INTRAMUSCULAR | Status: AC
  Administered 2016-09-13: 12.5 mg via INTRAVENOUS
  Filled 2016-09-13: qty 1

## 2016-09-13 MED ORDER — METOCLOPRAMIDE HCL 5 MG/ML IJ SOLN
10.0000 mg | Freq: Once | INTRAMUSCULAR | Status: AC
  Administered 2016-09-13: 10 mg via INTRAVENOUS
  Filled 2016-09-13: qty 2

## 2016-09-13 MED ORDER — FAMOTIDINE IN NACL 20-0.9 MG/50ML-% IV SOLN
20.0000 mg | Freq: Once | INTRAVENOUS | Status: AC
  Administered 2016-09-13: 20 mg via INTRAVENOUS
  Filled 2016-09-13: qty 50

## 2016-09-13 NOTE — ED Triage Notes (Signed)
Pt bib [facility] d/t vomiting x2 days and 2 episodes of diarrhea. Pt states this is chronic issue. Pt vomiting upon arrival, states began 5 hours after last cocaine use.

## 2016-09-13 NOTE — ED Notes (Signed)
Patient transported to CT 

## 2016-09-13 NOTE — Discharge Instructions (Signed)
Please follow the discharge instructions Dr. Owens Shark gave you this morning take the medicines he prescribed for you return as needed.

## 2016-09-13 NOTE — ED Triage Notes (Signed)
Pt reports discharged this morning after been seen for vomiting and abdominal pain. Pt reports vomiting in lobby and feels he can't go home.

## 2016-09-13 NOTE — ED Notes (Signed)
Pt visualized in NAD, resting in bed. Pt denies feeling any better. Will continue to monitor for further patient needs.

## 2016-09-13 NOTE — ED Notes (Signed)
NAD noted at time of D/C. Pt denies questions or concerns. Pt ambulatory to the lobby at this time.  

## 2016-09-13 NOTE — ED Notes (Signed)
Pt resting in bed with eyes closed. Respirations even and unlabored. Skin warm, dry, and intact. No vomiting noted since patient arrival to room. VSS. Will continue to monitor for further patient needs.

## 2016-09-13 NOTE — ED Provider Notes (Signed)
[facility] Emergency Department Provider Note   ____________________________________________   First MD Initiated Contact with Patient 09/13/16 [phone]     (approximate)  I have reviewed the triage vital signs and the nursing notes.   HISTORY  Chief Complaint Emesis    HPI Dakota Bowen is a 36 y.o. male patient seen and evaluated for vomiting today. He was discharged a few hours ago was out in the lobby and vomited and did not feel he can return home so checked back in again. Patient reports she's been vomiting for 2 days after using cocaine. Patient's white blood count earlier today was somewhat elevated at 16,000 CT scan was done and was negative. Patient complains of continued diffuse abdominal pain. Appears to be moderate in nature. Achy.   Past Medical History:  Diagnosis Date  . Cyclical vomiting syndrome   . GERD (gastroesophageal reflux disease)     Patient Active Problem List   Diagnosis Date Noted  . Intractable vomiting 02/24/2016  . Acute gastritis 05/19/2015  . Leukocytosis 05/19/2015  . Hyperglycemia 05/19/2015  . LLQ pain 05/19/2015  . Constipation 05/19/2015  . Nausea & vomiting 05/16/2015  . Malnutrition of moderate degree ([place]) 08/11/2014  . Hematemesis with nausea   . Hematemesis 08/09/2014  . Duodenitis 06/22/2014  . Gastritis 06/17/2014  . Vomiting 06/15/2014    Past Surgical History:  Procedure Laterality Date  . CHOLECYSTECTOMY    . ESOPHAGOGASTRODUODENOSCOPY (EGD) WITH PROPOFOL N/A 06/21/2014   Procedure: ESOPHAGOGASTRODUODENOSCOPY (EGD) WITH PROPOFOL. Looking in the esophagus stomach and upper small intestine with a lighted tube to examine and treat.;  Surgeon: Lollie Sails, MD;  Location: [facility] ENDOSCOPY;  Service: Endoscopy;  Laterality: N/A;    Prior to Admission medications   Medication Sig Start Date End Date Taking? Authorizing Provider  metoCLOPramide (REGLAN) 10 MG tablet Take 1 tablet (10 mg total)  by mouth every 8 (eight) hours as needed. 08/17/15  Yes Loney Hering, MD  ondansetron (ZOFRAN ODT) 4 MG disintegrating tablet Take 1 tablet (4 mg total) by mouth every 8 (eight) hours as needed for nausea or vomiting. 09/13/16  Yes Gregor Hams, MD  pantoprazole (PROTONIX) 40 MG tablet Take 1 tablet (40 mg total) by mouth daily. 09/13/16 09/13/17 Yes Gregor Hams, MD  famotidine (PEPCID) 20 MG tablet Take 1 tablet (20 mg total) by mouth 2 (two) times daily. Patient not taking: Reported on 09/13/2016 10/02/15   Carrie Mew, MD  ondansetron (ZOFRAN) 4 MG tablet Take 1 tablet (4 mg total) by mouth every 6 (six) hours as needed for nausea. Patient not taking: Reported on 09/13/2016 05/19/15   Theodoro Grist, MD  pantoprazole (PROTONIX) 40 MG tablet Take 1 tablet (40 mg total) by mouth daily. Patient not taking: Reported on 09/13/2016 05/19/15   Theodoro Grist, MD  promethazine (PHENERGAN) 25 MG suppository Place 1 suppository (25 mg total) rectally every 6 (six) hours as needed for nausea. Patient not taking: Reported on 09/13/2016 02/28/16   Henreitta Leber, MD  promethazine (PHENERGAN) 25 MG tablet Take 1 tablet (25 mg total) by mouth every 6 (six) hours as needed for nausea or vomiting. Patient not taking: Reported on 09/13/2016 02/28/16   Henreitta Leber, MD  sucralfate (CARAFATE) 1 g tablet Take 1 tablet (1 g total) by mouth 4 (four) times daily -  with meals and at bedtime. Patient not taking: Reported on 09/13/2016 05/19/15   Theodoro Grist, MD    Allergies Penicillins  Family History  Problem Relation Age of Onset  . Diabetes Mother   . Diabetes Father     Social History Social History  Substance Use Topics  . Smoking status: Current Every Day Smoker    Packs/day: 1.50  . Smokeless tobacco: Never Used  . Alcohol use Yes     Comment: 3-4beers/day    Review of Systems  Constitutional: No fever/chills Eyes: No visual changes. ENT: No sore throat. Cardiovascular: Denies  chest pain. Respiratory: Denies shortness of breath. Gastrointestinal: See history of present illness Genitourinary: Negative for dysuria. Musculoskeletal: Negative for back pain. Skin: Negative for rash. Neurological: Negative for headaches, focal weakness   ____________________________________________   PHYSICAL EXAM:  VITAL SIGNS: ED Triage Vitals  Enc Vitals Group     BP 09/13/16 0732 116/78     Pulse Rate 09/13/16 0732 72     Resp 09/13/16 0732 18     Temp 09/13/16 0732 98.4 F (36.9 C)     Temp Source 09/13/16 0732 Oral     SpO2 09/13/16 0732 100 %     Weight 09/13/16 0731 142 lb (64.4 kg)     Height --      Head Circumference --      Peak Flow --      Pain Score 09/13/16 0731 8     Pain Loc --      Pain Edu? --      Excl. in [place]? --     Constitutional: Alert and oriented. Looks chronically ill Eyes: Conjunctivae are normal. PERRL. EOMI. Head: Atraumatic. Nose: No congestion/rhinnorhea. Mouth/Throat: Mucous membranes are moist.  Oropharynx non-erythematous. Neck: No stridor.   Cardiovascular: Normal rate, regular rhythm. Grossly normal heart sounds.  Good peripheral circulation. Respiratory: Normal respiratory effort.  No retractions. Lungs CTAB. Gastrointestinal: Soft and moderately diffusely tender. No distention. No abdominal bruits. No CVA tenderness. Musculoskeletal: No lower extremity tenderness nor edema.  No joint effusions. Neurologic:  Normal speech and language. No gross focal neurologic deficits are appreciated. No gait instability! Skin:  Skin is warm, dry and intact. No rash noted. Psychiatric: Mood and affect are normal. Speech and behavior are normal.  ____________________________________________   LABS (all labs ordered are listed, but only abnormal results are displayed)  Labs Reviewed - No data to display ____________________________________________  EKG   ____________________________________________  RADIOLOGY  Ct Abdomen Pelvis W  Contrast  Result Date: 09/13/2016 CLINICAL DATA:  Acute on chronic vomiting and diarrhea for 2 days. History of cyclic vomiting syndrome, gastritis, duodenitis, cholecystectomy. EXAM: CT ABDOMEN AND PELVIS WITH CONTRAST TECHNIQUE: Multidetector CT imaging of the abdomen and pelvis was performed using the standard protocol following bolus administration of intravenous contrast. CONTRAST:  149mL ISOVUE-300 IOPAMIDOL (ISOVUE-300) INJECTION 61% COMPARISON:  Upper GI and small-bowel follow-through March 01, 2016 and CT abdomen and pelvis August 17, 2015 FINDINGS: LOWER CHEST: Lung bases are clear. Included heart size is normal. No pericardial effusion. HEPATOBILIARY: Status post cholecystectomy.  Normal CT liver. PANCREAS: Normal. SPLEEN: Normal. ADRENALS/URINARY TRACT: Kidneys are orthotopic, demonstrating symmetric enhancement. No nephrolithiasis, hydronephrosis or solid renal masses. The unopacified ureters are normal in course and caliber. Urinary bladder is partially distended and unremarkable. Normal adrenal glands. STOMACH/BOWEL: Very small hiatal hernia. Mildly thickened and redundant gastric rugae a The small and large bowel are normal in course and caliber without inflammatory changes, limited assessment without oral contrast. Normal appendix. VASCULAR/LYMPHATIC: Aortoiliac vessels are normal in course and caliber. Mild calcific atherosclerosis. No lymphadenopathy by CT size criteria. REPRODUCTIVE:  Borderline prostatomegaly. OTHER: No intraperitoneal free fluid or free air. MUSCULOSKELETAL: Nonacute. IMPRESSION: 1. No acute intra-abdominal or pelvic process. 2. Mild suspected chronic gastritis, corroborating recent upper GI. Aortic Atherosclerosis (ICD10-I70.0). Electronically Signed   By: Elon Alas M.D.   On: 09/13/2016 06:00    ____________________________________________   PROCEDURES  Procedure(s) performed:   Procedures  Critical Care performed:    ____________________________________________   INITIAL IMPRESSION / ASSESSMENT AND PLAN / ED COURSE  Pertinent labs & imaging results that were available during my care of the patient were reviewed by me and considered in my medical decision making (see chart for details).   Patient has no vomiting and ER now has a ride want to go home I will discharge him.     ____________________________________________   FINAL CLINICAL IMPRESSION(S) / ED DIAGNOSES  Final diagnoses:  Non-intractable vomiting with nausea, unspecified vomiting type      NEW MEDICATIONS STARTED DURING THIS VISIT:  New Prescriptions   No medications on file     Note:  This document was prepared using Dragon voice recognition software and may include unintentional dictation errors.    Nena Polio, MD 09/13/16 [phone]

## 2016-09-13 NOTE — ED Notes (Signed)
Pt aware of need for urine specimen. 

## 2016-09-13 NOTE — ED Notes (Signed)
ED Provider at bedside. 

## 2016-09-13 NOTE — ED Provider Notes (Signed)
[facility] Emergency Department Provider Note    First MD Initiated Contact with Patient 09/13/16 [phone]     (approximate)  I have reviewed the triage vital signs and the nursing notes.   HISTORY  Chief Complaint Nausea and Emesis    HPI Dakota Bowen is a 36 y.o. male with blow list of chronic medical conditions including cyclical vomiting syndrome presents to the emergency department with acute onset of generalized abdominal pain that is 10 out of 10 accompanied by vomiting 5 hours after crack cocaine use with onset 2 days ago. Patient denies any fever. Patient denies any diarrhea or constipation.   Past Medical History:  Diagnosis Date  . Cyclical vomiting syndrome   . GERD (gastroesophageal reflux disease)     Patient Active Problem List   Diagnosis Date Noted  . Intractable vomiting 02/24/2016  . Acute gastritis 05/19/2015  . Leukocytosis 05/19/2015  . Hyperglycemia 05/19/2015  . LLQ pain 05/19/2015  . Constipation 05/19/2015  . Nausea & vomiting 05/16/2015  . Malnutrition of moderate degree ([place]) 08/11/2014  . Hematemesis with nausea   . Hematemesis 08/09/2014  . Duodenitis 06/22/2014  . Gastritis 06/17/2014  . Vomiting 06/15/2014    Past Surgical History:  Procedure Laterality Date  . CHOLECYSTECTOMY    . ESOPHAGOGASTRODUODENOSCOPY (EGD) WITH PROPOFOL N/A 06/21/2014   Procedure: ESOPHAGOGASTRODUODENOSCOPY (EGD) WITH PROPOFOL. Looking in the esophagus stomach and upper small intestine with a lighted tube to examine and treat.;  Surgeon: Lollie Sails, MD;  Location: [facility] ENDOSCOPY;  Service: Endoscopy;  Laterality: N/A;    Prior to Admission medications   Medication Sig Start Date End Date Taking? Authorizing Provider  famotidine (PEPCID) 20 MG tablet Take 1 tablet (20 mg total) by mouth 2 (two) times daily. 10/02/15   Carrie Mew, MD  metoCLOPramide (REGLAN) 10 MG tablet Take 1 tablet (10 mg total) by mouth every 8 (eight)  hours as needed. 08/17/15   Loney Hering, MD  ondansetron (ZOFRAN ODT) 4 MG disintegrating tablet Take 1 tablet (4 mg total) by mouth every 8 (eight) hours as needed for nausea or vomiting. 09/13/16   Gregor Hams, MD  ondansetron (ZOFRAN) 4 MG tablet Take 1 tablet (4 mg total) by mouth every 6 (six) hours as needed for nausea. 05/19/15   Theodoro Grist, MD  pantoprazole (PROTONIX) 40 MG tablet Take 1 tablet (40 mg total) by mouth daily. 05/19/15   Theodoro Grist, MD  promethazine (PHENERGAN) 25 MG suppository Place 1 suppository (25 mg total) rectally every 6 (six) hours as needed for nausea. 02/28/16   Henreitta Leber, MD  promethazine (PHENERGAN) 25 MG tablet Take 1 tablet (25 mg total) by mouth every 6 (six) hours as needed for nausea or vomiting. 02/28/16   Henreitta Leber, MD  sucralfate (CARAFATE) 1 g tablet Take 1 tablet (1 g total) by mouth 4 (four) times daily -  with meals and at bedtime. 05/19/15   Theodoro Grist, MD    Allergies Penicillins  Family History  Problem Relation Age of Onset  . Diabetes Mother   . Diabetes Father     Social History Social History  Substance Use Topics  . Smoking status: Current Every Day Smoker    Packs/day: 1.50  . Smokeless tobacco: Never Used  . Alcohol use Yes     Comment: 3-4beers/day    Review of Systems Constitutional: No fever/chills Eyes: No visual changes. ENT: No sore throat. Cardiovascular: Denies chest pain. Respiratory: Denies  shortness of breath. Gastrointestinal: Positive abdominal pain and vomiting.  No diarrhea.  No constipation. Genitourinary: Negative for dysuria. Musculoskeletal: Negative for neck pain.  Negative for back pain. Integumentary: Negative for rash. Neurological: Negative for headaches, focal weakness or numbness.   ____________________________________________   PHYSICAL EXAM:  VITAL SIGNS: ED Triage Vitals  Enc Vitals Group     BP 09/13/16 0436 (!) 117/92     Pulse Rate 09/13/16 0442 64      Resp 09/13/16 0444 (!) 22     Temp 09/13/16 0444 97.9 F (36.6 C)     Temp Source 09/13/16 0444 Oral     SpO2 09/13/16 0442 100 %     Weight 09/13/16 0444 64.4 kg (142 lb)     Height 09/13/16 0444 1.829 m (6')     Head Circumference --      Peak Flow --      Pain Score 09/13/16 0444 8     Pain Loc --      Pain Edu? --      Excl. in [place]? --     Constitutional: Alert and oriented. Well appearing and in no acute distress. Eyes: Conjunctivae are normal.  Head: Atraumatic. Mouth/Throat: Mucous membranes are moist.  Oropharynx non-erythematous. Neck: No stridor.  Cardiovascular: Normal rate, regular rhythm. Good peripheral circulation. Grossly normal heart sounds. Respiratory: Normal respiratory effort.  No retractions. Lungs CTAB. Gastrointestinal: Generalized tenderness with very very mild palpation No distention.  Musculoskeletal: No lower extremity tenderness nor edema. No gross deformities of extremities. Neurologic:  Normal speech and language. No gross focal neurologic deficits are appreciated.  Skin:  Skin is warm, dry and intact. No rash noted. Psychiatric: Mood and affect are normal. Speech and behavior are normal.  ____________________________________________   LABS (all labs ordered are listed, but only abnormal results are displayed)  Labs Reviewed  CBC - Abnormal; Notable for the following:       Result Value   WBC 16.4 (*)    All other components within normal limits  COMPREHENSIVE METABOLIC PANEL - Abnormal; Notable for the following:    Glucose, Bld 143 (*)    All other components within normal limits  URINALYSIS, COMPLETE (UACMP) WITH MICROSCOPIC  URINE DRUG SCREEN, QUALITATIVE ([facility] ONLY)   _________________________________  RADIOLOGY I, Hanska Ernst Bowler, personally viewed and evaluated these images (plain radiographs) as part of my medical decision making, as well as reviewing the written report by the radiologist.  Ct Abdomen Pelvis W Contrast  Result  Date: 09/13/2016 CLINICAL DATA:  Acute on chronic vomiting and diarrhea for 2 days. History of cyclic vomiting syndrome, gastritis, duodenitis, cholecystectomy. EXAM: CT ABDOMEN AND PELVIS WITH CONTRAST TECHNIQUE: Multidetector CT imaging of the abdomen and pelvis was performed using the standard protocol following bolus administration of intravenous contrast. CONTRAST:  170mL ISOVUE-300 IOPAMIDOL (ISOVUE-300) INJECTION 61% COMPARISON:  Upper GI and small-bowel follow-through March 01, 2016 and CT abdomen and pelvis August 17, 2015 FINDINGS: LOWER CHEST: Lung bases are clear. Included heart size is normal. No pericardial effusion. HEPATOBILIARY: Status post cholecystectomy.  Normal CT liver. PANCREAS: Normal. SPLEEN: Normal. ADRENALS/URINARY TRACT: Kidneys are orthotopic, demonstrating symmetric enhancement. No nephrolithiasis, hydronephrosis or solid renal masses. The unopacified ureters are normal in course and caliber. Urinary bladder is partially distended and unremarkable. Normal adrenal glands. STOMACH/BOWEL: Very small hiatal hernia. Mildly thickened and redundant gastric rugae a The small and large bowel are normal in course and caliber without inflammatory changes, limited assessment without oral contrast. Normal appendix.  VASCULAR/LYMPHATIC: Aortoiliac vessels are normal in course and caliber. Mild calcific atherosclerosis. No lymphadenopathy by CT size criteria. REPRODUCTIVE: Borderline prostatomegaly. OTHER: No intraperitoneal free fluid or free air. MUSCULOSKELETAL: Nonacute. IMPRESSION: 1. No acute intra-abdominal or pelvic process. 2. Mild suspected chronic gastritis, corroborating recent upper GI. Aortic Atherosclerosis (ICD10-I70.0). Electronically Signed   By: Elon Alas M.D.   On: 09/13/2016 06:00      Procedures   ____________________________________________   INITIAL IMPRESSION / ASSESSMENT AND PLAN / ED COURSE  Pertinent labs & imaging results that were available during my  care of the patient were reviewed by me and considered in my medical decision making (see chart for details).  Patient given IV Reglan, Haldol 2.5 mg and a liter of normal saline as well as an additional liter of normal saline with no further vomiting noted in the emergency department. CT scan of the abdomen consistent with chronic gastritis.      ____________________________________________  FINAL CLINICAL IMPRESSION(S) / ED DIAGNOSES  Final diagnoses:  Other chronic gastritis without hemorrhage     MEDICATIONS GIVEN DURING THIS VISIT:  Medications  ketorolac (TORADOL) 30 MG/ML injection 30 mg (30 mg Intravenous Given 09/13/16 0456)  metoCLOPramide (REGLAN) injection 10 mg (10 mg Intravenous Given 09/13/16 0454)  haloperidol lactate (HALDOL) injection 2 mg (2 mg Intravenous Given 09/13/16 0456)  sodium chloride 0.9 % bolus 1,000 mL (1,000 mLs Intravenous New Bag/Given 09/13/16 0453)  sodium chloride 0.9 % bolus 1,000 mL (1,000 mLs Intravenous New Bag/Given 09/13/16 0453)  iopamidol (ISOVUE-300) 61 % injection 100 mL (100 mLs Intravenous Contrast Given 09/13/16 0523)     NEW OUTPATIENT MEDICATIONS STARTED DURING THIS VISIT:  New Prescriptions   ONDANSETRON (ZOFRAN ODT) 4 MG DISINTEGRATING TABLET    Take 1 tablet (4 mg total) by mouth every 8 (eight) hours as needed for nausea or vomiting.    Modified Medications   No medications on file    Discontinued Medications   No medications on file     Note:  This document was prepared using Dragon voice recognition software and may include unintentional dictation errors.    Gregor Hams, MD 09/13/16 [phone]

## 2016-09-13 NOTE — ED Notes (Signed)
Lights dimmed for patient comfort at this time, pt continues to rest in bed with NAD noted. Respirations even and unlabored. VSS. No vomiting noted at this time. Will continue to monitor for further patient needs.

## 2016-09-21 ENCOUNTER — Emergency Department: Payer: Self-pay

## 2016-09-21 ENCOUNTER — Encounter: Payer: Self-pay | Admitting: Emergency Medicine

## 2016-09-21 ENCOUNTER — Emergency Department
Admission: EM | Admit: 2016-09-21 | Discharge: 2016-09-24 | Disposition: A | Discharge status: Home / Self Care | Payer: Self-pay | Attending: Emergency Medicine | Admitting: Emergency Medicine

## 2016-09-21 DIAGNOSIS — F4325 Adjustment disorder with mixed disturbance of emotions and conduct: Secondary | ICD-10-CM

## 2016-09-21 DIAGNOSIS — F17200 Nicotine dependence, unspecified, uncomplicated: Secondary | ICD-10-CM | POA: Insufficient documentation

## 2016-09-21 DIAGNOSIS — T424X2A Poisoning by benzodiazepines, intentional self-harm, initial encounter: Secondary | ICD-10-CM | POA: Insufficient documentation

## 2016-09-21 DIAGNOSIS — R45851 Suicidal ideations: Secondary | ICD-10-CM | POA: Insufficient documentation

## 2016-09-21 DIAGNOSIS — Z79899 Other long term (current) drug therapy: Secondary | ICD-10-CM | POA: Insufficient documentation

## 2016-09-21 DIAGNOSIS — T424X1A Poisoning by benzodiazepines, accidental (unintentional), initial encounter: Secondary | ICD-10-CM

## 2016-09-21 DIAGNOSIS — T50902A Poisoning by unspecified drugs, medicaments and biological substances, intentional self-harm, initial encounter: Secondary | ICD-10-CM

## 2016-09-21 LAB — HEPATIC FUNCTION PANEL
ALK PHOS: 59 U/L (ref 38–126)
ALT: 26 U/L (ref 17–63)
AST: 34 U/L (ref 15–41)
Albumin: 4.5 g/dL (ref 3.5–5.0)
BILIRUBIN INDIRECT: 0.6 mg/dL (ref 0.3–0.9)
Bilirubin, Direct: 0.2 mg/dL (ref 0.1–0.5)
TOTAL PROTEIN: 7.1 g/dL (ref 6.5–8.1)
Total Bilirubin: 0.8 mg/dL (ref 0.3–1.2)

## 2016-09-21 LAB — BASIC METABOLIC PANEL
Anion gap: 7 (ref 5–15)
BUN: 11 mg/dL (ref 6–20)
CALCIUM: 9.1 mg/dL (ref 8.9–10.3)
CO2: 25 mmol/L (ref 22–32)
CREATININE: 0.78 mg/dL (ref 0.61–1.24)
Chloride: 106 mmol/L (ref 101–111)
Glucose, Bld: 89 mg/dL (ref 65–99)
Potassium: 3.7 mmol/L (ref 3.5–5.1)
SODIUM: 138 mmol/L (ref 135–145)

## 2016-09-21 LAB — SALICYLATE LEVEL

## 2016-09-21 LAB — MAGNESIUM: Magnesium: 2.3 mg/dL (ref 1.7–2.4)

## 2016-09-21 LAB — CBC WITH DIFFERENTIAL/PLATELET
BASOS ABS: 0.1 10*3/uL (ref 0–0.1)
BASOS PCT: 1 %
Eosinophils Absolute: 0.2 10*3/uL (ref 0–0.7)
Eosinophils Relative: 2 %
HEMATOCRIT: 40.5 % (ref 40.0–52.0)
Hemoglobin: 14 g/dL (ref 13.0–18.0)
Lymphocytes Relative: 20 %
Lymphs Abs: 2.2 10*3/uL (ref 1.0–3.6)
MCH: 32.2 pg (ref 26.0–34.0)
MCHC: 34.7 g/dL (ref 32.0–36.0)
MCV: 92.9 fL (ref 80.0–100.0)
MONO ABS: 1 10*3/uL (ref 0.2–1.0)
Monocytes Relative: 9 %
NEUTROS ABS: 7.9 10*3/uL — AB (ref 1.4–6.5)
NEUTROS PCT: 68 %
Platelets: 260 10*3/uL (ref 150–440)
RBC: 4.35 MIL/uL — ABNORMAL LOW (ref 4.40–5.90)
RDW: 14.5 % (ref 11.5–14.5)
WBC: 11.4 10*3/uL — ABNORMAL HIGH (ref 3.8–10.6)

## 2016-09-21 LAB — ETHANOL: Alcohol, Ethyl (B): <5 mg/dL (ref <5)

## 2016-09-21 LAB — ACETAMINOPHEN LEVEL

## 2016-09-21 MED ORDER — DIPHENHYDRAMINE HCL 50 MG/ML IJ SOLN
50.0000 mg | Freq: Once | INTRAMUSCULAR | Status: AC
  Administered 2016-09-21: 50 mg via INTRAMUSCULAR
  Filled 2016-09-21: qty 1

## 2016-09-21 MED ORDER — HALOPERIDOL LACTATE 5 MG/ML IJ SOLN
INTRAMUSCULAR | Status: AC
  Filled 2016-09-21: qty 1

## 2016-09-21 MED ORDER — LORAZEPAM 2 MG/ML IJ SOLN
2.0000 mg | Freq: Once | INTRAMUSCULAR | Status: AC
  Administered 2016-09-21: 2 mg via INTRAMUSCULAR

## 2016-09-21 MED ORDER — LORAZEPAM 2 MG/ML IJ SOLN
INTRAMUSCULAR | Status: AC
  Filled 2016-09-21: qty 1

## 2016-09-21 MED ORDER — TETANUS-DIPHTH-ACELL PERTUSSIS 5-2.5-18.5 LF-MCG/0.5 IM SUSP
0.5000 mL | Freq: Once | INTRAMUSCULAR | Status: DC
  Filled 2016-09-21: qty 0.5

## 2016-09-21 MED ORDER — LORAZEPAM 2 MG/ML IJ SOLN
2.0000 mg | Freq: Once | INTRAMUSCULAR | Status: AC
  Administered 2016-09-21: 2 mg via INTRAMUSCULAR
  Filled 2016-09-21: qty 1

## 2016-09-21 MED ORDER — DIPHENHYDRAMINE HCL 50 MG/ML IJ SOLN
INTRAMUSCULAR | Status: AC
  Filled 2016-09-21: qty 1

## 2016-09-21 MED ORDER — HALOPERIDOL LACTATE 5 MG/ML IJ SOLN
5.0000 mg | Freq: Once | INTRAMUSCULAR | Status: AC
  Administered 2016-09-21: 5 mg via INTRAMUSCULAR

## 2016-09-21 MED ORDER — SODIUM CHLORIDE 0.9 % IV BOLUS (SEPSIS)
1000.0000 mL | Freq: Once | INTRAVENOUS | Status: AC
  Administered 2016-09-21: 1000 mL via INTRAVENOUS

## 2016-09-21 MED ORDER — HALOPERIDOL LACTATE 5 MG/ML IJ SOLN
5.0000 mg | Freq: Once | INTRAMUSCULAR | Status: AC
  Administered 2016-09-21: 5 mg via INTRAMUSCULAR
  Filled 2016-09-21: qty 1

## 2016-09-21 MED ORDER — DIPHENHYDRAMINE HCL 50 MG/ML IJ SOLN
50.0000 mg | Freq: Once | INTRAMUSCULAR | Status: AC
  Administered 2016-09-21: 50 mg via INTRAMUSCULAR

## 2016-09-21 NOTE — BH Assessment (Signed)
TTS consult unable to be completed at this time due to patient being sedated.
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
## 2016-09-21 NOTE — ED Notes (Signed)
The patient is resting at this time.

## 2016-09-21 NOTE — ED Notes (Signed)
BEHAVIORAL HEALTH ROUNDING Patient sleeping: Yes.   Patient alert and oriented: eyes closed  Appears to be asleep Behavior appropriate: Yes.  ; If no, describe:  Nutrition and fluids offered: Yes  Toileting and hygiene offered: sleeping Sitter present: q 15 minute observations and security monitoring Law enforcement present: yes  ODS 

## 2016-09-21 NOTE — ED Notes (Addendum)
Received a call from poison control - Gina  Suggested to increase his potassium intake to decrease his QT interval  Potassium should be high normal to decrease his risks  - I will encourage PO intake - repeat ECG when poison control calls back  Otherwise pts labs WNL per [facility]

## 2016-09-21 NOTE — ED Notes (Signed)
He is now standing in the hallway - cursing at [place] officers when the EDP \\walked  up  Pt states  "Are yout he fucking psychiatrist"  EDP stated "No" he stated "then get the fuck out of my damn way  - call the fucking psychiatrist and tell him to get the fuck down here to see me - I am going to fuck somebody up."  Verbal med order received
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
## 2016-09-21 NOTE — ED Notes (Addendum)
IVF initiated  - pt lying in bed with his eyes closed - snoring can be heard  VSS  Labs specimens obtained by myself and sent to lab

## 2016-09-21 NOTE — ED Notes (Signed)
He has removed his cardiac monitor and other VS leads  - refusing to place them back on

## 2016-09-21 NOTE — ED Notes (Signed)
ED Is the patient under IVC or is there intent for IVC: Yes.   Is the patient medically cleared: Yes.   Is there vacancy in the ED BHU: Yes.   Is the population mix appropriate for patient: Yes.   Is the patient awaiting placement in inpatient or outpatient setting:  Has the patient had a psychiatric consult: pending  Survey of unit performed for contraband, proper placement and condition of furniture, tampering with fixtures in bathroom, shower, and each patient room: Yes.  ; Findings:  APPEARANCE/BEHAVIOR Calm and cooperative NEURO ASSESSMENT Orientation: oriented x3  Denies pain Hallucinations: No.None noted (Hallucinations) Speech: Normal Gait: normal RESPIRATORY ASSESSMENT Even  Unlabored respirations  CARDIOVASCULAR ASSESSMENT Pulses equal   regular rate  Skin warm and dry   GASTROINTESTINAL ASSESSMENT no GI complaint EXTREMITIES Full ROM  PLAN OF CARE Provide calm/safe environment. Vital signs assessed twice daily. ED BHU Assessment once each 12-hour shift. Collaborate with TTS daily or as condition indicates. Assure the ED provider has rounded once each shift. Provide and encourage hygiene. Provide redirection as needed. Assess for escalating behavior; address immediately and inform ED provider.  Assess family dynamic and appropriateness for visitation as needed: Yes.  ; If necessary, describe findings:  Educate the patient/family about BHU procedures/visitation: Yes.  ; If necessary, describe findings:

## 2016-09-21 NOTE — ED Notes (Signed)
Patient changed into hospital provided attire by MD Owens Shark and [employer] officers
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
## 2016-09-21 NOTE — ED Provider Notes (Signed)
Clinical Course as of Sep 22 1511  Fri Sep 21, 2016  0759 Assuming care from Dr. Owens Shark.  In short, Dakota Bowen is a 36 y.o. male with a chief complaint of intentional overdose of multiple medications, as well as HI and SI.  Refer to the original H&P for additional details.  The current plan of care is to monitor clinically and stabilize for psychiatric assessment.  Negative wrist radiographs and head CT.  Labs reassuring.  Continuing to monitor.  [CF]  1512 patient is still sleeping and Dr. Weber Cooks was unable to assess him.  Medically stable at this point.  [CF]    Clinical Course User Index [CF] Hinda Kehr, MD      Hinda Kehr, MD 09/21/16 [phone]

## 2016-09-21 NOTE — Consult Note (Signed)
Psychiatry: Attempted to interview patient. Found patient to be very sedated and not able to wake up and engage in any conversation. Blood pressure is still running a little low. Chart reviewed and case discussed with emergency room physician and TTS. We will reassess as he becomes more interactive.

## 2016-09-21 NOTE — ED Notes (Signed)
Patient combative, verbally aggressive, threatening staff. Patient demanding personal possessions and to be released. RN attempted therapeutic communication and verbal de-escalation. Patient continues to threaten staff. MD Rifenbark order medications (see MAR)

## 2016-09-21 NOTE — BH Assessment (Signed)
Writer was unable to wake patient up to participate for the assessment.
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
## 2016-09-21 NOTE — ED Notes (Signed)
BEHAVIORAL HEALTH ROUNDING Patient sleeping: Yes.   Patient alert and oriented: eyes closed  Appears to be asleep Behavior appropriate: Yes.  ; If no, describe:  Nutrition and fluids offered: Yes  Toileting and hygiene offered: sleeping Sitter present: q 15 minute observations and security monitoring Law enforcement present: yes  ODS  He has turned onto his side and appears to be sleeping soundly

## 2016-09-21 NOTE — ED Notes (Signed)
BEHAVIORAL HEALTH ROUNDING Patient sleeping: No. Patient alert and oriented: yes Behavior appropriate: Yes.  ; If no, describe:  Nutrition and fluids offered: yes Toileting and hygiene offered: Yes  Sitter present: q15 minute observations and security  monitoring Law enforcement present: Yes  ODS  

## 2016-09-21 NOTE — ED Notes (Signed)
Patient observed lying in bed with eyes closed  Even, unlabored respirations observed   NAD pt appears to be sleeping  I will continue to monitor along with every 15 minute visual observations and ongoing security monitoring    

## 2016-09-21 NOTE — ED Notes (Signed)

## 2016-09-21 NOTE — Consult Note (Signed)
  Psychiatry: Once again attempted to assess the patient. Evidently I am told that in the middle part of the evening he woke up but became immediately belligerent and disruptive and despite being encouraged to calm down so that he could be evaluated he continued to escalate. Emergency room staff were forced to sedate him for safety. Now he is once again sleepy to the point of being unable to wake up or interact with me.

## 2016-09-21 NOTE — ED Notes (Signed)
Patient reported that in the last 3-4 hours he took 16 xanax, 10-14 Klonopin, 20 gabapentin. Patient reports he took 8 zoloft yesterday.    Poison control called, spoke with [place]. Poison control recommendations as follows:  Tylenol level, aspirin level, alcohol level, complete metabolic panel, magnesium level, EKG, cardiac monitoring, IV NaCL bolus. Recommended to hold haldol and benadryl due to possible effect on QT interval.  Recommend continued monitoring for 6 hours, longer if patient not back to baseline. Recommend head CT.   Possible concerns per poison control: CNS depression, respiratory depression, hypotension, QT elongation, seizures.    MD Arnoldo Lenis informed.

## 2016-09-21 NOTE — ED Notes (Signed)
Update given to [facility] at [place]

## 2016-09-22 MED ORDER — LORAZEPAM 1 MG PO TABS
1.0000 mg | ORAL_TABLET | Freq: Four times a day (QID) | ORAL | Status: DC | PRN
  Administered 2016-09-23 – 2016-09-24 (×4): 1 mg via ORAL
  Filled 2016-09-22 (×4): qty 1

## 2016-09-22 MED ORDER — HALOPERIDOL 5 MG PO TABS
5.0000 mg | ORAL_TABLET | Freq: Four times a day (QID) | ORAL | Status: DC | PRN
  Administered 2016-09-23: 5 mg via ORAL
  Filled 2016-09-22: qty 1

## 2016-09-22 MED ORDER — NICOTINE 21 MG/24HR TD PT24
21.0000 mg | MEDICATED_PATCH | Freq: Every day | TRANSDERMAL | Status: DC
  Administered 2016-09-22 – 2016-09-24 (×3): 21 mg via TRANSDERMAL
  Filled 2016-09-22 (×3): qty 1

## 2016-09-22 MED ORDER — HALOPERIDOL 5 MG PO TABS
ORAL_TABLET | ORAL | Status: AC
  Administered 2016-09-22: 5 mg via ORAL
  Filled 2016-09-22: qty 1

## 2016-09-22 MED ORDER — LORAZEPAM 1 MG PO TABS
1.0000 mg | ORAL_TABLET | Freq: Once | ORAL | Status: AC
  Administered 2016-09-22: 1 mg via ORAL

## 2016-09-22 MED ORDER — HALOPERIDOL 5 MG PO TABS
5.0000 mg | ORAL_TABLET | Freq: Once | ORAL | Status: AC
  Administered 2016-09-22: 5 mg via ORAL

## 2016-09-22 MED ORDER — LORAZEPAM 1 MG PO TABS
ORAL_TABLET | ORAL | Status: AC
  Filled 2016-09-22: qty 1

## 2016-09-22 MED ORDER — LORAZEPAM 1 MG PO TABS
ORAL_TABLET | ORAL | Status: AC
  Administered 2016-09-22: 1 mg via ORAL
  Filled 2016-09-22: qty 1

## 2016-09-22 NOTE — ED Notes (Signed)
Report to Dr Gretel Acre. telepsych computer at bedside and on

## 2016-09-22 NOTE — ED Notes (Signed)
Patient has been resting. Wakes up belligerent but coherent. States "what do I do to get out of this fucking place". States he wants his clothes. Explained he is on hold and we will contact [facility] for interview to get patient evaluated. Offered Ativan because patient continues agitated however states he does not want that, just wants to be released. SOC screen to room. Patient also refuses tdap. Superficial healing abrasion to R lower abd noted.

## 2016-09-22 NOTE — ED Notes (Signed)
BEHAVIORAL HEALTH ROUNDING Patient sleeping: No. Patient alert and oriented: yes Behavior appropriate: No.; If no, describe: aggressive Nutrition and fluids offered: Yes  Toileting and hygiene offered: Yes  Sitter present: not applicable Law enforcement present: Yes

## 2016-09-22 NOTE — ED Notes (Signed)
Patient resting, refuses breakfast at present.

## 2016-09-22 NOTE — ED Provider Notes (Signed)
-----------------------------------------   5:25 AM on 09/22/2016 -----------------------------------------   Blood pressure 92/68, pulse 64, temperature 97.9 F (36.6 C), temperature source Oral, resp. rate (!) 22, height 6' (1.829 m), weight 64.4 kg (142 lb), SpO2 100 %.  The patient had no acute events since last update.  Calm and cooperative at this time.  Disposition is pending Psychiatry/Behavioral Medicine team recommendations.     Darel Hong, MD 09/22/16 [phone]

## 2016-09-22 NOTE — ED Notes (Signed)
Called for [facility] consult  [phone]
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
## 2016-09-22 NOTE — ED Notes (Signed)
BEHAVIORAL HEALTH ROUNDING Patient sleeping: Yes.   Patient alert and oriented: not applicable Behavior appropriate: Yes.  ; If no, describe:  Nutrition and fluids offered: Yes  Toileting and hygiene offered: Yes  Sitter present: not applicable Law enforcement present: Yes

## 2016-09-22 NOTE — ED Notes (Signed)
Patient is alert and oriented. Patient is irritable with staff and upset for having to be admitted. Patient states he doesn't know why he is admitted. Patient with minimal engagement in conversation with this Probation officer. Introduce self to patient and provided support and explain 15 minute rounds. Patient left alone to rest as requested.  Q 15 minute checks in process and patient remains safe on unit. Patient in no distress. Monitoring continues.

## 2016-09-22 NOTE — ED Notes (Signed)
BEHAVIORAL HEALTH ROUNDING Patient sleeping: Yes.   Patient alert and oriented: not applicable Behavior appropriate: Yes.  ; If no, describe:  Nutrition and fluids offered: No Toileting and hygiene offered: No Sitter present: not applicable Law enforcement present: Yes  

## 2016-09-22 NOTE — ED Notes (Signed)
IVC 

## 2016-09-22 NOTE — ED Notes (Signed)
Patient now resting in room. No yelling at present.

## 2016-09-22 NOTE — ED Notes (Signed)
Pt is verbally abusive towards staff. Screaming and slamming door.

## 2016-09-22 NOTE — ED Notes (Signed)
BEHAVIORAL HEALTH ROUNDING Patient sleeping: Yes.   Patient alert and oriented:na Behavior appropriate: Yes.  ; If no, describe:  Nutrition and fluids offered: Yes  Toileting and hygiene offered: Yes  Sitter present: not applicable Law enforcement present: Yes

## 2016-09-22 NOTE — ED Notes (Signed)
Pt. Alert and oriented, warm and dry, in no distress. Pt. Denies SI, HI, and AVH. Pt states, "I am just irritable because I am here and ya'll took my things." Sandwich tray given with a lemon lime soda.  Pt. Encouraged to let nursing staff know of any concerns or needs.

## 2016-09-22 NOTE — ED Provider Notes (Addendum)
-----------------------------------------   7:50 AM on 09/22/2016 -----------------------------------------   Blood pressure 102/70, pulse 82, temperature 97.8 F (36.6 C), temperature source Oral, resp. rate 16, height 1.829 m (6'), weight 64.4 kg (142 lb), SpO2 95 %.  The patient had no acute events since last update.  Calm and cooperative at this time.  Disposition is pending Psychiatry/Behavioral Medicine team recommendations.     Hinda Kehr, MD 09/22/16 [phone]

## 2016-09-22 NOTE — ED Notes (Signed)

## 2016-09-22 NOTE — ED Provider Notes (Signed)
Clinical Course as of Sep 23 1642  Fri Sep 21, 2016  8921 Assuming care from Dr. Owens Shark.  In short, Dakota Bowen is a 36 y.o. male with a chief complaint of intentional overdose of multiple medications, as well as HI and SI.  Refer to the original H&P for additional details.  The current plan of care is to monitor clinically and stabilize for psychiatric assessment.  Negative wrist radiographs and head CT.  Labs reassuring.  Continuing to monitor.  [CF]  1512 patient is still sleeping and Dr. Weber Cooks was unable to assess him.  Medically stable at this point.  [CF]  Sat Sep 22, 2016  1211 The patient was assessed by the specialist on-call psychiatrist, Dr. Gretel Acre, who feels that the patient remains a threat to himself and others, meets criteria for involuntary commitment, and would benefit from inpatient psychiatry service.  He recommends held off 5 mg by mouth every 6 hours as needed for agitation as well as Ativan 1 milligrams by mouth every 6 hours as needed for agitation.  We will update the patient regarding the plan.  This information was obtained from the written psych report which was faxed to [place] by Dr. Gretel Acre.  [CF]    Clinical Course User Index [CF] Dakota Kehr, MD    Final diagnoses:  Medication overdose, intentional self-harm, initial encounter [facility])      Dakota Kehr, MD 09/22/16 [phone]
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
## 2016-09-23 LAB — URINE DRUG SCREEN, QUALITATIVE (ARMC ONLY)
Amphetamines, Ur Screen: NOT DETECTED
Barbiturates, Ur Screen: NOT DETECTED
Benzodiazepine, Ur Scrn: NOT DETECTED
CANNABINOID 50 NG, UR ~~LOC~~: NOT DETECTED
Cocaine Metabolite,Ur ~~LOC~~: POSITIVE — AB
MDMA (ECSTASY) UR SCREEN: NOT DETECTED
Methadone Scn, Ur: NOT DETECTED
OPIATE, UR SCREEN: NOT DETECTED
PHENCYCLIDINE (PCP) UR S: NOT DETECTED
Tricyclic, Ur Screen: NOT DETECTED

## 2016-09-23 NOTE — ED Notes (Signed)
Patient talked with this Probation officer; stated he has been depressed for a while. He also stated he has lost everything wife, kids, job. Patient stated he has never taken medication for depression. Patient tearful and helpless. Support and encouragement offered. Patient remains safe on unit.

## 2016-09-23 NOTE — ED Notes (Signed)
Patient asleep. Respirations even and non labored. No distress noted. Q 15 minute checks continues and patient remains safe on unit.

## 2016-09-23 NOTE — ED Notes (Signed)
Patient tearful and upset after phone call with mom. Requested medication. PRN Ativan 1 mg given for agitation.

## 2016-09-23 NOTE — BH Assessment (Signed)
Writer followed up with referral;   [facility] [phone] or [phone]), Left voicemail for return phone call.   [facility]([phone]), No beds   [facility]([phone]), Pending Review   [phone]), Pending review for wait list   [facility] [phone]), information refaxed.

## 2016-09-23 NOTE — BH Assessment (Signed)
Spoke with representative from [facility].  The patient is on the wait list.

## 2016-09-23 NOTE — ED Notes (Signed)
Patient alert and awake, given some shasta to drink. No concerns voiced. Q 15 minute checks continue. Patient in no distress. Patient remains safe on unit. Monitoring continues.

## 2016-09-23 NOTE — ED Notes (Signed)
Patient resting quietly in room. No noted distress or abnormal behaviors noted. Will continue 15 minute checks and observation by security camera for safety. 
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
## 2016-09-23 NOTE — ED Notes (Signed)
Patient is resting in bed with eyes open. Patient is less agitated at  This time. Patient given specimen container for urine sample. Patient cooperative and calm. Patient provided support and encouragement. Q 15 minute checks in progress and maintained. Patient remains safe on unit. Patient voices no concerns at this time. Monitoring continues.

## 2016-09-23 NOTE — BH Assessment (Signed)
Referral information for Psychiatric Hospitalization faxed to;      [facility] [phone] or [phone]   [facility] [phone]),    [address] [phone]),    [place] [phone]),    [facility] [phone]),

## 2016-09-23 NOTE — ED Notes (Signed)
Called for [facility] consult  [phone]

## 2016-09-23 NOTE — ED Notes (Signed)
Patient asleep. Respirations even and non labored. No distress noted. Monitoring continues.

## 2016-09-24 DIAGNOSIS — F4325 Adjustment disorder with mixed disturbance of emotions and conduct: Secondary | ICD-10-CM

## 2016-09-24 DIAGNOSIS — T424X1A Poisoning by benzodiazepines, accidental (unintentional), initial encounter: Secondary | ICD-10-CM

## 2016-09-24 NOTE — ED Notes (Signed)
Patient still complaining of increased anxiety and agitation.

## 2016-09-24 NOTE — ED Notes (Signed)
Patient requests to take shower. Pt given new scrubs, towels and toiletries and is now taking a shower.
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
## 2016-09-24 NOTE — ED Notes (Signed)
Patient visiting with mother in [place]

## 2016-09-24 NOTE — ED Provider Notes (Signed)
Cleared for discharge by Dr. Bethena Midget, MD 09/24/16 [phone]

## 2016-09-24 NOTE — ED Notes (Addendum)
Patient awake and alert sitting on bed with tv on. Pt agitated, asking to go outside to smoke a cigarette. Pt reminded that he can't go outside and offered his nicotene patch, which pt declines, replying , "they don't do nothin'". Pt notified of pt's mother being here to see pt.. Pt agrees to visit with mother

## 2016-09-24 NOTE — ED Provider Notes (Signed)
-----------------------------------------   7:04 AM on 09/24/2016 -----------------------------------------   Blood pressure 103/83, pulse 78, temperature 97.8 F (36.6 C), temperature source Oral, resp. rate 18, height 6' (1.829 m), weight 64.4 kg (142 lb), SpO2 100 %.  The patient had no acute events since last update.  Calm and cooperative at this time.  Disposition is pending Psychiatry/Behavioral Medicine team recommendations.     Paulette Blanch, MD 09/24/16 [phone]

## 2016-09-24 NOTE — ED Notes (Signed)
Patient complaining of anxiety and agitation.  Requesting medication to help decrease symptoms.

## 2016-09-24 NOTE — ED Notes (Addendum)
Pt discharged to lobby. Mother in lobby to transport pt. Pt was stable and appreciative at that time. All papers were given and valuables returned. Verbal understanding expressed. Denies SI/HI and A/VH. Pt given opportunity to express concerns and ask questions.

## 2016-12-06 ENCOUNTER — Encounter: Payer: Self-pay | Admitting: Emergency Medicine

## 2016-12-06 ENCOUNTER — Emergency Department
Admission: EM | Admit: 2016-12-06 | Discharge: 2016-12-10 | Disposition: A | Discharge status: Other Acute Inpatient Hospital | Payer: Self-pay | Attending: Emergency Medicine | Admitting: Emergency Medicine

## 2016-12-06 DIAGNOSIS — F17200 Nicotine dependence, unspecified, uncomplicated: Secondary | ICD-10-CM | POA: Insufficient documentation

## 2016-12-06 DIAGNOSIS — R440 Auditory hallucinations: Secondary | ICD-10-CM | POA: Insufficient documentation

## 2016-12-06 DIAGNOSIS — R45851 Suicidal ideations: Secondary | ICD-10-CM | POA: Insufficient documentation

## 2016-12-06 DIAGNOSIS — G4700 Insomnia, unspecified: Secondary | ICD-10-CM | POA: Insufficient documentation

## 2016-12-06 DIAGNOSIS — Z046 Encounter for general psychiatric examination, requested by authority: Secondary | ICD-10-CM | POA: Insufficient documentation

## 2016-12-06 LAB — URINE DRUG SCREEN, QUALITATIVE (ARMC ONLY)
AMPHETAMINES, UR SCREEN: POSITIVE — AB
Barbiturates, Ur Screen: NOT DETECTED
Benzodiazepine, Ur Scrn: NOT DETECTED
Cannabinoid 50 Ng, Ur ~~LOC~~: POSITIVE — AB
Cocaine Metabolite,Ur ~~LOC~~: POSITIVE — AB
MDMA (ECSTASY) UR SCREEN: NOT DETECTED
METHADONE SCREEN, URINE: NOT DETECTED
OPIATE, UR SCREEN: NOT DETECTED
PHENCYCLIDINE (PCP) UR S: NOT DETECTED
Tricyclic, Ur Screen: NOT DETECTED

## 2016-12-06 LAB — COMPREHENSIVE METABOLIC PANEL
ALT: 53 U/L (ref 17–63)
AST: 46 U/L — AB (ref 15–41)
Albumin: 5.1 g/dL — ABNORMAL HIGH (ref 3.5–5.0)
Alkaline Phosphatase: 97 U/L (ref 38–126)
Anion gap: 7 (ref 5–15)
BUN: 12 mg/dL (ref 6–20)
CHLORIDE: 103 mmol/L (ref 101–111)
CO2: 27 mmol/L (ref 22–32)
Calcium: 9.6 mg/dL (ref 8.9–10.3)
Creatinine, Ser: 0.8 mg/dL (ref 0.61–1.24)
Glucose, Bld: 84 mg/dL (ref 65–99)
POTASSIUM: 3.7 mmol/L (ref 3.5–5.1)
SODIUM: 137 mmol/L (ref 135–145)
Total Bilirubin: 0.6 mg/dL (ref 0.3–1.2)
Total Protein: 7.8 g/dL (ref 6.5–8.1)

## 2016-12-06 LAB — ETHANOL

## 2016-12-06 LAB — CBC
HCT: 43.2 % (ref 40.0–52.0)
HEMOGLOBIN: 14.7 g/dL (ref 13.0–18.0)
MCH: 31.2 pg (ref 26.0–34.0)
MCHC: 34.1 g/dL (ref 32.0–36.0)
MCV: 91.8 fL (ref 80.0–100.0)
Platelets: 270 10*3/uL (ref 150–440)
RBC: 4.71 MIL/uL (ref 4.40–5.90)
RDW: 13.4 % (ref 11.5–14.5)
WBC: 6.9 10*3/uL (ref 3.8–10.6)

## 2016-12-06 LAB — SALICYLATE LEVEL

## 2016-12-06 LAB — ACETAMINOPHEN LEVEL: Acetaminophen (Tylenol), Serum: <10 ug/mL — ABNORMAL LOW (ref 10–30)

## 2016-12-06 MED ORDER — HALOPERIDOL 5 MG PO TABS
5.0000 mg | ORAL_TABLET | Freq: Once | ORAL | Status: AC
  Administered 2016-12-06: 5 mg via ORAL

## 2016-12-06 MED ORDER — LORAZEPAM 2 MG PO TABS
ORAL_TABLET | ORAL | Status: AC
  Filled 2016-12-06: qty 1

## 2016-12-06 MED ORDER — LORAZEPAM 2 MG PO TABS
ORAL_TABLET | ORAL | Status: AC
  Administered 2016-12-06: 2 mg via ORAL
  Filled 2016-12-06: qty 1

## 2016-12-06 MED ORDER — LORAZEPAM 2 MG PO TABS
2.0000 mg | ORAL_TABLET | Freq: Once | ORAL | Status: AC
  Administered 2016-12-06: 2 mg via ORAL

## 2016-12-06 MED ORDER — HALOPERIDOL 5 MG PO TABS
ORAL_TABLET | ORAL | Status: AC
  Filled 2016-12-06: qty 1

## 2016-12-06 MED ORDER — HALOPERIDOL 5 MG PO TABS
ORAL_TABLET | ORAL | Status: AC
  Administered 2016-12-06: 5 mg via ORAL
  Filled 2016-12-06: qty 1

## 2016-12-06 NOTE — ED Notes (Signed)

## 2016-12-06 NOTE — ED Notes (Signed)
BEHAVIORAL HEALTH ROUNDING Patient sleeping: Yes.   Patient alert and oriented: eyes closed  Appears to be asleep Behavior appropriate: Yes.  ; If no, describe:  Nutrition and fluids offered: Yes  Toileting and hygiene offered: sleeping Sitter present: q 15 minute observations and security monitoring Law enforcement present: yes  ODS 

## 2016-12-06 NOTE — ED Notes (Signed)

## 2016-12-06 NOTE — ED Notes (Signed)
BEHAVIORAL HEALTH ROUNDING Patient sleeping: No. Patient alert and oriented: yes Behavior appropriate: Yes.  ; If no, describe:  Nutrition and fluids offered: yes Toileting and hygiene offered: Yes  Sitter present: q15 minute observations and security  monitoring Law enforcement present: Yes  ODS  

## 2016-12-06 NOTE — ED Notes (Signed)
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
## 2016-12-06 NOTE — ED Notes (Signed)
Report given to [facility] consultant - pt sleeping soundly - we will call them back when he has rested and is awake and more alert   Continue to monitor
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
## 2016-12-06 NOTE — ED Notes (Signed)
Pt is groggy and forwards little to staff on admission, although he does contract for safety at this time. Pt states that he still has suicidal ideations as well as AVH. Pt states voices are telling him to "watch out" for people around him. Pt also states that he has not slept for 3 days. He was difficult to arouse from sleep and returned to sleep immediately after arrival in the [place]. Writer discussed tx plan and 15 minute checks are ongoing for safety.

## 2016-12-06 NOTE — BH Assessment (Signed)
Referral information for Psychiatric Hospitalization faxed to;     [facility] [phone] or [phone])   [facility] [phone]),    [address] [phone]),    [place] [phone]),    [facility] [phone]),    [facility] [phone]).
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
## 2016-12-07 MED ORDER — LORAZEPAM 1 MG PO TABS
1.0000 mg | ORAL_TABLET | Freq: Once | ORAL | Status: AC
Start: 2016-12-07 — End: 2016-12-07
  Administered 2016-12-07: 1 mg via ORAL
  Filled 2016-12-07: qty 1

## 2016-12-07 NOTE — ED Notes (Signed)
Pt complaining of anxiety, tremulous. EDP notified. 1 mg Ativan PO administered as ordered. Pt denies daily alcohol intake. RN told patient to make her aware of any needs. Pt then asked for a noose. RN had patient once again contract for safety while in the hospital. Pt obliged. RN to monitor. Maintained on 15 minute checks and observation by security camera for safety.

## 2016-12-07 NOTE — BH Assessment (Signed)
Referral information for  Placement have been faxed to and follow up calls have been made to;     [facility] [phone] or [phone]); left vm with intake department to callback at [phone]   [facility] [phone]), currently full; pt on waiting list   [facility] [phone]), currently full; pt on waiting list   [facility] [phone]), [phone]; resent information, currently full   [facility]- self pay; pt will need to discuss with billing department [phone])    [facility] [phone]). Pt referral currently under review     12/07/2016 Ethelene Browns, MS, LPCA Therapeutic Triage Specialist

## 2016-12-07 NOTE — ED Notes (Signed)
IVC pending placement 

## 2016-12-07 NOTE — Progress Notes (Signed)
Received Dakota Bowen this PM in his room asleep, he was receptacle to monitoring his VS. He asked and requested a snack, although he drifted back off to sleep. He is being monitoring closely. He got up at 0225 hrs and used the bathroom,requested a ginger ale and stated he feels miserable and wants to die. He returned to his room and back sleeping. He was awaken for his vital sign monitoring, afterward he asked for medication for anxiety. Pt is somewhat irritable.

## 2016-12-07 NOTE — ED Notes (Signed)
Patient watching TV.  No noted distress or abnormal behaviors noted. Will continue 15 minute checks and observation by security camera for safety.

## 2016-12-07 NOTE — ED Notes (Signed)
Pt withdrawn, depressed affect. Pt endorses SI, but contracts for safety in the hospital. Not forwarding much information to this Probation officer. Pt denies taking any home medications other than occasional "heart burn meds."  Pt did not wish to speak with anyone from pharmacy regarding home medications. Denies pain. Maintained on 15 minute checks and observation by security camera for safety.

## 2016-12-07 NOTE — ED Notes (Signed)
Pt given lunch tray.

## 2016-12-07 NOTE — ED Notes (Signed)
Patient asleep in room. No noted distress or abnormal behavior. Will continue 15 minute checks and observation by security cameras for safety. 

## 2016-12-07 NOTE — ED Notes (Signed)
Breakfast tray placed in patient's room.

## 2016-12-07 NOTE — ED Notes (Signed)
Pt laying under blanket, but face is uncovered. Will continue to monitor.

## 2016-12-07 NOTE — ED Notes (Signed)
Patient resting quietly in room. No noted distress or abnormal behaviors noted. Will continue 15 minute checks and observation by security camera for safety. 

## 2016-12-08 MED ORDER — HALOPERIDOL LACTATE 5 MG/ML IJ SOLN
10.0000 mg | Freq: Once | INTRAMUSCULAR | Status: AC
  Administered 2016-12-08: 10 mg via INTRAMUSCULAR
  Filled 2016-12-08: qty 2

## 2016-12-08 MED ORDER — HYDROXYZINE HCL 25 MG PO TABS
50.0000 mg | ORAL_TABLET | Freq: Once | ORAL | Status: AC
Start: 2016-12-08 — End: 2016-12-08
  Administered 2016-12-08: 50 mg via ORAL

## 2016-12-08 MED ORDER — HALOPERIDOL LACTATE 5 MG/ML IJ SOLN: 10.0000 mg | Freq: Once | INTRAMUSCULAR | Status: DC

## 2016-12-08 MED ORDER — TRAZODONE HCL 100 MG PO TABS
100.0000 mg | ORAL_TABLET | Freq: Once | ORAL | Status: AC
  Administered 2016-12-08: 100 mg via ORAL

## 2016-12-08 MED ORDER — TRAZODONE HCL 100 MG PO TABS
ORAL_TABLET | ORAL | Status: AC
  Administered 2016-12-08: 100 mg via ORAL
  Filled 2016-12-08: qty 1

## 2016-12-08 MED ORDER — HYDROXYZINE HCL 25 MG PO TABS
ORAL_TABLET | ORAL | Status: AC
  Administered 2016-12-08: 50 mg via ORAL
  Filled 2016-12-08: qty 2

## 2016-12-08 MED ORDER — LORAZEPAM 1 MG PO TABS
ORAL_TABLET | ORAL | Status: AC
  Filled 2016-12-08: qty 2

## 2016-12-08 MED ORDER — LORAZEPAM 2 MG PO TABS
2.0000 mg | ORAL_TABLET | Freq: Once | ORAL | Status: AC
  Administered 2016-12-08: 2 mg via ORAL

## 2016-12-08 NOTE — ED Notes (Signed)
Behavioral Health Hourly Rounding - Patient Behavior: Sleeping Nutrition and Fluids offered?: No Toileting and Hygiene offered?: No Sitter present?: No Law Enforcement present?: Yes

## 2016-12-08 NOTE — ED Notes (Signed)
Pt was banging his head on the wall. Pt then went to the door and started hitting his head with the door. Pt was assisted to bed. Dr. Jimmye Norman called and came over. Order for 2 mg of Ativan by mouth and 10mg  of haldol IM was given. Medication given to pt. Pt currently lying in bed. Remains on Q15 mins safety rounds. Will cont. To monitor pt.
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
## 2016-12-08 NOTE — ED Notes (Signed)
Pt spoke to tele-psych. Will cont to monitor pt.

## 2016-12-08 NOTE — BHH Counselor (Signed)
Spoke with Kennyth Lose at [facility] and the pt is still on the wait list.

## 2016-12-08 NOTE — ED Notes (Signed)
No number for family member provided. Pt refused to give a number to Probation officer. Will cont to monitor

## 2016-12-08 NOTE — ED Notes (Signed)
Pt is currently resting in bed with eyes closed. Chest is rising/falling. No acute distressed noted/reported. Medication was effectiveWill cont to monitor pt.Marland Kitchen

## 2016-12-08 NOTE — ED Provider Notes (Signed)
-----------------------------------------   7:12 AM on 12/08/2016 -----------------------------------------   Blood pressure 105/69, pulse 71, temperature 97.6 F (36.4 C), temperature source Oral, resp. rate 16, weight 64.4 kg (142 lb), SpO2 97 %.  The patient had no acute events since last update.  Calm and cooperative at this time.  Disposition is pending per Psychiatry/Behavioral Medicine team recommendations.      Rudene Re, MD 12/08/16 [phone]

## 2016-12-08 NOTE — ED Notes (Signed)
Pt is awake in bed this evening. Pt mood is depressed and affect is sad, crying and stating that he does not want to live anymore, although he does contract for safety at this time and is cooperative thus far. Agrees not to participate in self harm. C/O ongoing AH and racing thoughts. Writer encouraged rest and discussed tx plan. PRN medication administered as well. Pt is also refusing food. 15 minute checks are ongoing for safety.

## 2016-12-09 MED ORDER — LORAZEPAM 2 MG PO TABS
2.0000 mg | ORAL_TABLET | Freq: Once | ORAL | Status: AC
  Administered 2016-12-09: 2 mg via ORAL
  Filled 2016-12-09: qty 1

## 2016-12-09 NOTE — ED Notes (Signed)
Entered his room due to call light was blinking - I introduced myself to him  He verbalizes  "I don't give a fuck - I am ready to fucking go - this shit is getting on my nerves - I have been here sitting all day calm but I am getting ready to tear some shit up.  I am not listening to you - Go get the fucking doctor - I am ready to fucking go"  Attempted to reassure him - he disregards what I am saying -  Educated about his IVC status and his pending admission to an inpt unit  He currently denies SI - verbalizing threats of HI   Pt tore door off of its hinges in [place] yesterday

## 2016-12-09 NOTE — ED Notes (Signed)
BEHAVIORAL HEALTH ROUNDING Patient sleeping: No. Patient alert and oriented: yes Behavior appropriate: Yes.  ; If no, describe:  Nutrition and fluids offered: yes Toileting and hygiene offered: Yes  Sitter present: q15 minute observations and security camera monitoring Law enforcement present: Yes  ODS  

## 2016-12-09 NOTE — ED Provider Notes (Signed)
-----------------------------------------   6:48 AM on 12/09/2016 -----------------------------------------   Blood pressure 103/62, pulse 83, temperature 98.7 F (37.1 C), temperature source Oral, resp. rate 16, weight 64.4 kg (142 lb), SpO2 95 %.  The patient had no acute events since last update.  Sleeping at this time.  Disposition is pending Psychiatry/Behavioral Medicine team recommendations.     Paulette Blanch, MD 12/09/16 [phone]

## 2016-12-09 NOTE — ED Notes (Signed)

## 2016-12-09 NOTE — Progress Notes (Signed)
LCSW consulted with TTS and patient is on waitlist for [facility] but there is no sheriff transport until Monday.  Phenix Vandermeulen LCSW

## 2016-12-09 NOTE — ED Notes (Signed)
Pt continues to hit call light button without needing any assistant. Writer explain to patient that the button is used for emergency or assistance. Patient verbalize "he don't give a shit and will cont to hit it all day" Pt remains on Q 15 mins safety. Will cont to monitor pt.

## 2016-12-09 NOTE — ED Notes (Signed)
Ivc /pending placement 

## 2016-12-10 MED ORDER — NICOTINE 21 MG/24HR TD PT24
21.0000 mg | MEDICATED_PATCH | Freq: Every day | TRANSDERMAL | Status: DC
  Administered 2016-12-10: 21 mg via TRANSDERMAL
  Filled 2016-12-10: qty 1

## 2016-12-10 NOTE — BH Assessment (Signed)
Patient has been accepted to [facility].  Patient assigned to room [facility] Accepting physician is Dr. Ileene Rubens.  Call report to [phone].  Representative was [employer].  ER Staff is aware of it Holley Raring, ER Sect.; Dr. Quentin Cornwall, [place], Patient's Nurse).

## 2016-12-10 NOTE — ED Notes (Signed)
Patient discharged to [facility]. Patient informed of admission to [facility] and was without questions. Patient alert and oriented. Patient ambulatory and medical stable. All patient belongings returned to him at discharge and given to transporting [place]. Patient vitals at discharge 97.7-120/84-87-18-100% room air. Patient escorted to meet sheriff by staff. Report called to Joseph Pierini RN at [facility].

## 2016-12-10 NOTE — ED Provider Notes (Signed)
-----------------------------------------   7:42 AM on 12/10/2016 -----------------------------------------   Blood pressure 113/67, pulse 95, temperature 98.8 F (37.1 C), temperature source Oral, resp. rate 18, weight 64.4 kg (142 lb), SpO2 99 %.  The patient had no acute events since last update.  Calm and cooperative at this time.  Disposition is pending Psychiatry/Behavioral Medicine team recommendations.     Paulette Blanch, MD 12/10/16 [phone]

## 2016-12-10 NOTE — ED Notes (Signed)
Patient is alert and oriented. Patient states he is here because of self harm thoughts and actions. Patient would not go into detail. Patient is currently denying SI/HI and A/V hallucinations. Patient is requesting to see the doctor and to go home. Patient informed of doctors schedule. Patient is calm and cooperative. Q 15 minute checks in progress and patient remains safe on unit.

## 2017-01-24 ENCOUNTER — Inpatient Hospital Stay
Admission: EM | Admit: 2017-01-24 | Discharge: 2017-01-26 | DRG: 103 | Disposition: A | Discharge status: Home / Self Care | Payer: Self-pay | Attending: Specialist | Admitting: Specialist

## 2017-01-24 ENCOUNTER — Inpatient Hospital Stay: Payer: Self-pay

## 2017-01-24 ENCOUNTER — Emergency Department: Payer: Self-pay

## 2017-01-24 ENCOUNTER — Other Ambulatory Visit: Payer: Self-pay

## 2017-01-24 ENCOUNTER — Encounter: Payer: Self-pay | Admitting: Emergency Medicine

## 2017-01-24 DIAGNOSIS — E871 Hypo-osmolality and hyponatremia: Secondary | ICD-10-CM | POA: Diagnosis present

## 2017-01-24 DIAGNOSIS — E873 Alkalosis: Secondary | ICD-10-CM | POA: Diagnosis present

## 2017-01-24 DIAGNOSIS — F1210 Cannabis abuse, uncomplicated: Secondary | ICD-10-CM | POA: Diagnosis present

## 2017-01-24 DIAGNOSIS — E860 Dehydration: Secondary | ICD-10-CM | POA: Diagnosis present

## 2017-01-24 DIAGNOSIS — D72829 Elevated white blood cell count, unspecified: Secondary | ICD-10-CM | POA: Diagnosis present

## 2017-01-24 DIAGNOSIS — K219 Gastro-esophageal reflux disease without esophagitis: Secondary | ICD-10-CM | POA: Diagnosis present

## 2017-01-24 DIAGNOSIS — R1115 Cyclical vomiting syndrome unrelated to migraine: Secondary | ICD-10-CM | POA: Diagnosis present

## 2017-01-24 DIAGNOSIS — E876 Hypokalemia: Secondary | ICD-10-CM | POA: Diagnosis present

## 2017-01-24 DIAGNOSIS — Z880 Allergy status to penicillin: Secondary | ICD-10-CM

## 2017-01-24 DIAGNOSIS — R112 Nausea with vomiting, unspecified: Secondary | ICD-10-CM

## 2017-01-24 DIAGNOSIS — F1410 Cocaine abuse, uncomplicated: Secondary | ICD-10-CM | POA: Diagnosis present

## 2017-01-24 DIAGNOSIS — F17210 Nicotine dependence, cigarettes, uncomplicated: Secondary | ICD-10-CM | POA: Diagnosis present

## 2017-01-24 DIAGNOSIS — G43A0 Cyclical vomiting, not intractable: Principal | ICD-10-CM | POA: Diagnosis present

## 2017-01-24 DIAGNOSIS — E878 Other disorders of electrolyte and fluid balance, not elsewhere classified: Secondary | ICD-10-CM | POA: Diagnosis present

## 2017-01-24 LAB — COMPREHENSIVE METABOLIC PANEL
ALBUMIN: 5 g/dL (ref 3.5–5.0)
ALT: 29 U/L (ref 17–63)
AST: 46 U/L — AB (ref 15–41)
Alkaline Phosphatase: 120 U/L (ref 38–126)
Anion gap: 20 — ABNORMAL HIGH (ref 5–15)
BUN: 32 mg/dL — AB (ref 6–20)
CHLORIDE: 83 mmol/L — AB (ref 101–111)
CO2: 24 mmol/L (ref 22–32)
CREATININE: 1.21 mg/dL (ref 0.61–1.24)
Calcium: 9.4 mg/dL (ref 8.9–10.3)
GFR calc Af Amer: >60 mL/min (ref >60)
GFR calc non Af Amer: >60 mL/min (ref >60)
GLUCOSE: 103 mg/dL — AB (ref 65–99)
POTASSIUM: 3 mmol/L — AB (ref 3.5–5.1)
Sodium: 127 mmol/L — ABNORMAL LOW (ref 135–145)
Total Bilirubin: 1.8 mg/dL — ABNORMAL HIGH (ref 0.3–1.2)
Total Protein: 8.2 g/dL — ABNORMAL HIGH (ref 6.5–8.1)

## 2017-01-24 LAB — URINE DRUG SCREEN, QUALITATIVE (ARMC ONLY)
AMPHETAMINES, UR SCREEN: POSITIVE — AB
BARBITURATES, UR SCREEN: NOT DETECTED
Benzodiazepine, Ur Scrn: NOT DETECTED
COCAINE METABOLITE, UR ~~LOC~~: POSITIVE — AB
Cannabinoid 50 Ng, Ur ~~LOC~~: POSITIVE — AB
MDMA (Ecstasy)Ur Screen: NOT DETECTED
Methadone Scn, Ur: NOT DETECTED
OPIATE, UR SCREEN: POSITIVE — AB
Phencyclidine (PCP) Ur S: NOT DETECTED
TRICYCLIC, UR SCREEN: NOT DETECTED

## 2017-01-24 LAB — URINALYSIS, COMPLETE (UACMP) WITH MICROSCOPIC
BACTERIA UA: NONE SEEN
Glucose, UA: NEGATIVE mg/dL
Hgb urine dipstick: NEGATIVE
Ketones, ur: >160 mg/dL — AB
Leukocytes, UA: NEGATIVE
Nitrite: NEGATIVE
PH: 7 (ref 5.0–8.0)
Protein, ur: 30 mg/dL — AB
SPECIFIC GRAVITY, URINE: 1.02 (ref 1.005–1.030)

## 2017-01-24 LAB — LIPASE, BLOOD: LIPASE: 36 U/L (ref 11–51)

## 2017-01-24 LAB — CBC
HEMATOCRIT: 42.5 % (ref 40.0–52.0)
Hemoglobin: 15 g/dL (ref 13.0–18.0)
MCH: 30.7 pg (ref 26.0–34.0)
MCHC: 35.3 g/dL (ref 32.0–36.0)
MCV: 86.8 fL (ref 80.0–100.0)
PLATELETS: 429 10*3/uL (ref 150–440)
RBC: 4.9 MIL/uL (ref 4.40–5.90)
RDW: 13.2 % (ref 11.5–14.5)
WBC: 18.8 10*3/uL — ABNORMAL HIGH (ref 3.8–10.6)

## 2017-01-24 MED ORDER — ENOXAPARIN SODIUM 40 MG/0.4ML ~~LOC~~ SOLN
40.0000 mg | SUBCUTANEOUS | Status: DC
  Administered 2017-01-24 – 2017-01-25 (×2): 40 mg via SUBCUTANEOUS
  Filled 2017-01-24 (×2): qty 0.4

## 2017-01-24 MED ORDER — PROMETHAZINE HCL 25 MG/ML IJ SOLN
12.5000 mg | Freq: Four times a day (QID) | INTRAMUSCULAR | Status: DC | PRN
  Administered 2017-01-25 – 2017-01-26 (×4): 12.5 mg via INTRAVENOUS
  Filled 2017-01-24 (×4): qty 1

## 2017-01-24 MED ORDER — SODIUM CHLORIDE 0.9 % IV SOLN
Freq: Once | INTRAVENOUS | Status: AC
  Administered 2017-01-24: 16:00:00 via INTRAVENOUS

## 2017-01-24 MED ORDER — MORPHINE SULFATE (PF) 2 MG/ML IV SOLN
1.0000 mg | INTRAVENOUS | Status: DC | PRN
  Administered 2017-01-24: 1 mg via INTRAVENOUS
  Filled 2017-01-24: qty 1

## 2017-01-24 MED ORDER — ACETAMINOPHEN 650 MG RE SUPP
650.0000 mg | Freq: Four times a day (QID) | RECTAL | Status: DC | PRN
Start: 2017-01-24 — End: 2017-01-26

## 2017-01-24 MED ORDER — LORAZEPAM 2 MG/ML IJ SOLN
0.5000 mg | Freq: Once | INTRAMUSCULAR | Status: AC
  Administered 2017-01-24: 0.5 mg via INTRAVENOUS
  Filled 2017-01-24: qty 1

## 2017-01-24 MED ORDER — BUDESONIDE 0.5 MG/2ML IN SUSP
0.5000 mg | Freq: Two times a day (BID) | RESPIRATORY_TRACT | Status: DC
  Administered 2017-01-24 – 2017-01-25 (×3): 0.5 mg via RESPIRATORY_TRACT
  Filled 2017-01-24 (×3): qty 2

## 2017-01-24 MED ORDER — IPRATROPIUM-ALBUTEROL 0.5-2.5 (3) MG/3ML IN SOLN
3.0000 mL | Freq: Four times a day (QID) | RESPIRATORY_TRACT | Status: DC
  Administered 2017-01-24 – 2017-01-25 (×5): 3 mL via RESPIRATORY_TRACT
  Filled 2017-01-24 (×5): qty 3

## 2017-01-24 MED ORDER — MAGNESIUM SULFATE 2 GM/50ML IV SOLN
2.0000 g | Freq: Once | INTRAVENOUS | Status: AC
  Administered 2017-01-24: 2 g via INTRAVENOUS
  Filled 2017-01-24: qty 50

## 2017-01-24 MED ORDER — SODIUM CHLORIDE 0.9 % IV BOLUS (SEPSIS)
1000.0000 mL | Freq: Once | INTRAVENOUS | Status: AC
  Administered 2017-01-24: 1000 mL via INTRAVENOUS

## 2017-01-24 MED ORDER — POTASSIUM CHLORIDE IN NACL 20-0.9 MEQ/L-% IV SOLN
INTRAVENOUS | Status: DC
  Administered 2017-01-24 – 2017-01-26 (×5): via INTRAVENOUS
  Filled 2017-01-24 (×8): qty 1000

## 2017-01-24 MED ORDER — MORPHINE SULFATE (PF) 2 MG/ML IV SOLN
2.0000 mg | INTRAVENOUS | Status: DC | PRN
  Administered 2017-01-24 – 2017-01-25 (×2): 2 mg via INTRAVENOUS
  Filled 2017-01-24 (×2): qty 1

## 2017-01-24 MED ORDER — PANTOPRAZOLE SODIUM 40 MG IV SOLR
40.0000 mg | Freq: Two times a day (BID) | INTRAVENOUS | Status: DC
  Administered 2017-01-24: 40 mg via INTRAVENOUS
  Filled 2017-01-24: qty 40

## 2017-01-24 MED ORDER — PROMETHAZINE HCL 25 MG/ML IJ SOLN
25.0000 mg | Freq: Once | INTRAMUSCULAR | Status: AC
  Administered 2017-01-24: 25 mg via INTRAVENOUS
  Filled 2017-01-24: qty 1

## 2017-01-24 MED ORDER — ONDANSETRON 4 MG PO TBDP
4.0000 mg | ORAL_TABLET | Freq: Once | ORAL | Status: AC | PRN
  Administered 2017-01-24: 4 mg via ORAL
  Filled 2017-01-24: qty 1

## 2017-01-24 MED ORDER — ONDANSETRON HCL 4 MG/2ML IJ SOLN
4.0000 mg | Freq: Four times a day (QID) | INTRAMUSCULAR | Status: DC
  Administered 2017-01-24 – 2017-01-26 (×8): 4 mg via INTRAVENOUS
  Filled 2017-01-24 (×8): qty 2

## 2017-01-24 MED ORDER — ACETAMINOPHEN 325 MG PO TABS
650.0000 mg | ORAL_TABLET | Freq: Four times a day (QID) | ORAL | Status: DC | PRN
  Administered 2017-01-26: 650 mg via ORAL
  Filled 2017-01-24 (×2): qty 2

## 2017-01-24 NOTE — H&P (Signed)
[place] [facility] [place] NAME: Dakota Bowen    MR#:  [id number]  DATE OF BIRTH:  06/27/1980  DATE OF ADMISSION:  01/24/2017  PRIMARY CARE PHYSICIAN: Albina Billet, MD   REQUESTING/REFERRING PHYSICIAN: Dr Conni Slipper  CHIEF COMPLAINT:   Chief Complaint  Patient presents with  . Emesis    HISTORY OF PRESENT ILLNESS:  Dakota Bowen  is a 37 y.o. male with a known history of nausea vomiting for 4 days.  He states that he has a history of cyclic vomiting syndrome.  He has been vomiting too many times to count.  Occasionally the vomit is pinkish.  He is feeling very weak and dehydrated.  He has not urinated in 2 days.  He states he has fever chills and sweats and some weight loss.  PAST MEDICAL HISTORY:   Past Medical History:  Diagnosis Date  . Cyclical vomiting syndrome   . GERD (gastroesophageal reflux disease)   . GERD (gastroesophageal reflux disease)     PAST SURGICAL HISTORY:   Past Surgical History:  Procedure Laterality Date  . CHOLECYSTECTOMY    . ESOPHAGOGASTRODUODENOSCOPY (EGD) WITH PROPOFOL N/A 06/21/2014   Procedure: ESOPHAGOGASTRODUODENOSCOPY (EGD) WITH PROPOFOL. Looking in the esophagus stomach and upper small intestine with a lighted tube to examine and treat.;  Surgeon: Lollie Sails, MD;  Location: [facility] ENDOSCOPY;  Service: Endoscopy;  Laterality: N/A;    SOCIAL HISTORY:   Social History   Tobacco Use  . Smoking status: Current Every Day Smoker    Packs/day: 1.50  . Smokeless tobacco: Never Used  Substance Use Topics  . Alcohol use: Yes    Comment: 3-4beers/day    FAMILY HISTORY:   Family History  Problem Relation Age of Onset  . Diabetes Mother   . Cancer Mother   . Diabetes Father     DRUG ALLERGIES:   Penicillin  REVIEW OF SYSTEMS:  CONSTITUTIONAL: Positive for fever chills and sweats.  Positive for weight loss. EYES: No blurred or double vision.  EARS, NOSE, AND THROAT: No  tinnitus or ear pain.  Positive for sore throat RESPIRATORY: Positive for cough, positive for shortness of breath, no wheezing or hemoptysis.  CARDIOVASCULAR: Occasional chest pain, no orthopnea, edema.  GASTROINTESTINAL: Positive for nausea, vomiting, and abdominal pain. No blood in bowel movements.  No diarrhea GENITOURINARY:  no urination in a few days ENDOCRINE: No polyuria, nocturia,  HEMATOLOGY: No anemia, easy bruising or bleeding SKIN: No rash or lesion. MUSCULOSKELETAL: No joint pain or arthritis.   NEUROLOGIC: Passed out the other day PSYCHIATRY: History of anxiety and depression.   MEDICATIONS AT HOME:   Patient does not take any medications  VITAL SIGNS:  Blood pressure (!) 141/90, pulse (!) 117, temperature 97.9 F (36.6 C), temperature source Oral, resp. rate (!) 30, weight 68 kg (150 lb), SpO2 96 %.  PHYSICAL EXAMINATION:  GENERAL:  37 y.o.-year-old patient lying in the bed with pain.  EYES: Pupils equal, round, reactive to light and accommodation. No scleral icterus. Extraocular muscles intact.  HEENT: Head atraumatic, normocephalic. Oropharynx and nasopharynx clear.  NECK:  Supple, no jugular venous distention. No thyroid enlargement, no tenderness.  LUNGS:  decreased breath sounds bilaterally, positive for wheezing, no rales,rhonchi or crepitation. No use of accessory muscles of respiration.  CARDIOVASCULAR: S1, S2 tachycardic. No murmurs, rubs, or gallops.  ABDOMEN: Soft, positive for epigastric tenderness, nondistended. Bowel sounds present. No organomegaly or mass.  EXTREMITIES: No pedal edema, cyanosis, or  clubbing.  NEUROLOGIC: Cranial nerves II through XII are intact. Muscle strength 5/5 in all extremities. Sensation intact. Gait not checked.  PSYCHIATRIC: The patient is alert and oriented x 3.  SKIN: No rash, lesion, or ulcer.   LABORATORY PANEL:   CBC Recent Labs  Lab 01/24/17 1430  WBC 18.8*  HGB 15.0  HCT 42.5  PLT 429    ------------------------------------------------------------------------------------------------------------------  Chemistries  Recent Labs  Lab 01/24/17 1430  NA 127*  K 3.0*  CL 83*  CO2 24  GLUCOSE 103*  BUN 32*  CREATININE 1.21  CALCIUM 9.4  AST 46*  ALT 29  ALKPHOS 120  BILITOT 1.8*   ------------------------------------------------------------------------------------------------------------------     IMPRESSION AND PLAN:   1.  Cyclic vomiting syndrome.  Standing dose of Zofran.  PRN Phenergan.  PRN morphine.  IV fluid hydration.  Empiric Protonix.  Patient advised to stop using marijuana and cocaine.  This could be part of his issue.  Could be cannabis hyperemesis syndrome.  Send off urine toxicology. 2.  Hyponatremia and hypokalemia.  Replace magnesium.  IV fluids with potassium in it 3.  Substance abuse with cocaine and marijuana.  I advised him to stop the substances because they are probably contributing to his issue. 4.  Tobacco abuse.  Smoking cessation counseling done 4 minutes by me.  Nicotine patch applied 5.  Wheezing.  Start nebulizer treatments with budesonide 6.  Leukocytosis likely secondary to vomiting   All the records are reviewed and case discussed with ED provider. Management plans discussed with the patient, family and they are in agreement.  CODE STATUS: Full code  TOTAL TIME TAKING CARE OF THIS PATIENT: 50 minutes.    Loletha Grayer M.D on 01/24/2017 at 5:20 PM  Between 7am to 6pm - Pager - [phone]  After 6pm call admission pager [phone]  [facility]  [phone]  CC: Primary care physician; Albina Billet, MD

## 2017-01-24 NOTE — ED Notes (Signed)
Spoke with pharmacist [facility] who verified KCL and magnesium sulfate could be hung together.

## 2017-01-24 NOTE — ED Provider Notes (Signed)
[facility] Emergency Department Provider Note   ____________________________________________   First MD Initiated Contact with Patient 01/24/17 1553     (approximate)  I have reviewed the triage vital signs and the nursing notes.   HISTORY  Chief Complaint Emesis   HPI Dakota Bowen is a 37 y.o. male Patient reports he has cyclic vomiting syndrome he's been vomiting for 4 days this time he says he's very dehydrated. He is very weak. He says Phenergan works better than Zofran for him. He has some belly pain but it's the same as they always gets when he has the vomiting. He has no fever.   Past Medical History:  Diagnosis Date  . Cyclical vomiting syndrome   . GERD (gastroesophageal reflux disease)     Patient Active Problem List   Diagnosis Date Noted  . Benzodiazepine overdose 09/24/2016  . 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 Intractable vomiting 02/24/2016  . Acute gastritis 05/19/2015  . Leukocytosis 05/19/2015  . Hyperglycemia 05/19/2015  . LLQ pain 05/19/2015  . Constipation 05/19/2015  . Nausea & vomiting 05/16/2015  . Malnutrition of moderate degree ([place]) 08/11/2014  . Hematemesis with nausea   . Hematemesis 08/09/2014  . Duodenitis 06/22/2014  . Gastritis 06/17/2014  . Vomiting 06/15/2014    Past Surgical History:  Procedure Laterality Date  . CHOLECYSTECTOMY    . ESOPHAGOGASTRODUODENOSCOPY (EGD) WITH PROPOFOL N/A 06/21/2014   Procedure: ESOPHAGOGASTRODUODENOSCOPY (EGD) WITH PROPOFOL. Looking in the esophagus stomach and upper small intestine with a lighted tube to examine and treat.;  Surgeon: Lollie Sails, MD;  Location: [facility] ENDOSCOPY;  Service: Endoscopy;  Laterality: N/A;    Prior to Admission medications   Medication Sig Start Date End Date Taking? Authorizing Provider  famotidine (PEPCID) 20 MG tablet Take 1 tablet (20 mg total) by mouth 2 (two) times daily. Patient not  taking: Reported on 09/13/2016 10/02/15   Carrie Mew, MD  metoCLOPramide (REGLAN) 10 MG tablet Take 1 tablet (10 mg total) by mouth every 8 (eight) hours as needed. Patient not taking: Reported on 12/08/2016 08/17/15   Loney Hering, MD  ondansetron (ZOFRAN ODT) 4 MG disintegrating tablet Take 1 tablet (4 mg total) by mouth every 8 (eight) hours as needed for nausea or vomiting. Patient not taking: Reported on 12/08/2016 09/13/16   Gregor Hams, MD  ondansetron (ZOFRAN) 4 MG tablet Take 1 tablet (4 mg total) by mouth every 6 (six) hours as needed for nausea. Patient not taking: Reported on 09/13/2016 05/19/15   Theodoro Grist, MD  pantoprazole (PROTONIX) 40 MG tablet Take 1 tablet (40 mg total) by mouth daily. Patient not taking: Reported on 09/13/2016 05/19/15   Theodoro Grist, MD  pantoprazole (PROTONIX) 40 MG tablet Take 1 tablet (40 mg total) by mouth daily. Patient not taking: Reported on 12/08/2016 09/13/16 09/13/17  Gregor Hams, MD  promethazine (PHENERGAN) 25 MG suppository Place 1 suppository (25 mg total) rectally every 6 (six) hours as needed for nausea. Patient not taking: Reported on 09/13/2016 02/28/16   Henreitta Leber, MD  promethazine (PHENERGAN) 25 MG tablet Take 1 tablet (25 mg total) by mouth every 6 (six) hours as needed for nausea or vomiting. Patient not taking: Reported on 09/13/2016 02/28/16   Henreitta Leber, MD  sucralfate (CARAFATE) 1 g tablet Take 1 tablet (1 g total) by mouth 4 (four) times daily -  with meals and at bedtime. Patient not taking: Reported on 09/13/2016  05/19/15   Theodoro Grist, MD    Allergies Penicillins  Family History  Problem Relation Age of Onset  . Diabetes Mother   . Diabetes Father     Social History Social History   Tobacco Use  . Smoking status: Current Every Day Smoker    Packs/day: 1.50  . Smokeless tobacco: Never Used  Substance Use Topics  . Alcohol use: Yes    Comment: 3-4beers/day  . Drug use: Yes    Types:  Marijuana, Cocaine    Review of Systems  Constitutional: No fever/chills Eyes: No visual changes. ENT: No sore throat. Cardiovascular: Denies chest pain. Respiratory: Denies shortness of breath. Gastrointestinal:see history of present illness Genitourinary: Negative for dysuria. Musculoskeletal: Negative for back pain. Skin: Negative for rash. Neurological: Negative for headaches, focal weakness   ____________________________________________   PHYSICAL EXAM:  VITAL SIGNS: ED Triage Vitals [01/24/17 1424]  Enc Vitals Group     BP (!) 131/100     Pulse Rate (!) 108     Resp 18     Temp 97.9 F (36.6 C)     Temp Source Oral     SpO2 100 %     Weight 150 lb (68 kg)     Height      Head Circumference      Peak Flow      Pain Score 8     Pain Loc      Pain Edu?      Excl. in [place]?     Constitutional: Alert and oriented. Well appearing and in no acute distress. Eyes: Conjunctivae are normal.  Head: Atraumatic. Nose: No congestion/rhinnorhea. Mouth/Throat: Mucous membranes are moist.  Oropharynx non-erythematous. Neck: No stridor.   Cardiovascular: Normal rate, regular rhythm. Grossly normal heart sounds.  Good peripheral circulation. Respiratory: Normal respiratory effort.  No retractions. Lungs CTAB. Gastrointestinal: firm but not rigid diffusely tender to palpation bowel sounds are positive. No distention. No abdominal bruits. No CVA tenderness. Musculoskeletal: No lower extremity tenderness nor edema.  No joint effusions. Neurologic:  Normal speech and language. No gross focal neurologic deficits are appreciated. No gait instability. Skin:  Skin is warm, dry and intact. No rash noted. Psychiatric: Mood and affect are normal. Speech and behavior are normal.  ____________________________________________   LABS (all labs ordered are listed, but only abnormal results are displayed)  Labs Reviewed  COMPREHENSIVE METABOLIC PANEL - Abnormal; Notable for the following  components:      Result Value   Sodium 127 (*)    Potassium 3.0 (*)    Chloride 83 (*)    Glucose, Bld 103 (*)    BUN 32 (*)    Total Protein 8.2 (*)    AST 46 (*)    Total Bilirubin 1.8 (*)    Anion gap 20 (*)    All other components within normal limits  CBC - Abnormal; Notable for the following components:   WBC 18.8 (*)    All other components within normal limits  LIPASE, BLOOD  URINALYSIS, COMPLETE (UACMP) WITH MICROSCOPIC   ____________________________________________  EKG  EKG read and interpreted by me shows sinus tachycardia rate of 107 normal axis no acute ST-T wave changes are seen QTC is 552 ____________________________________________  RADIOLOGY   ____________________________________________   PROCEDURES  Procedure(s) performed:   Procedures  Critical Care performed:  ____________________________________________   INITIAL IMPRESSION / ASSESSMENT AND PLAN / ED COURSE  patient presents with his usual belly pain and nausea and vomiting consistent with cyclic  vomiting syndrome. He has hypochloremic alkalosis probably from the vomiting. We will replete his fluids with normal saline I will get some belly x-rays to make sure that that is okay to treat him with some Phenergan plan on admission as his sodium is 127.      ____________________________________________   FINAL CLINICAL IMPRESSION(S) / ED DIAGNOSES  Final diagnoses:  Dehydration  Non-intractable vomiting with nausea, unspecified vomiting type  Hyponatremia  Hypochloremic alkalosis     ED Discharge Orders    None       Note:  This document was prepared using Dragon voice recognition software and may include unintentional dictation errors.    Nena Polio, MD 01/24/17 1620

## 2017-01-24 NOTE — ED Triage Notes (Addendum)
Pt to ed with c/o vomiting x 4 days.  Pt shaking at triage. Pt states "I am extremely dehydrated"  Pt with multiple bruises noted all over bilat arms.  Pt states recent hospitalization at [facility] for vomiting. Hx of polysubstance abuse.  Pt states last use was 2 weeks prior to today.

## 2017-01-24 NOTE — ED Notes (Signed)
Patient transported to X-ray 

## 2017-01-25 LAB — CBC
HEMATOCRIT: 35.2 % — AB (ref 40.0–52.0)
Hemoglobin: 12.4 g/dL — ABNORMAL LOW (ref 13.0–18.0)
MCH: 31 pg (ref 26.0–34.0)
MCHC: 35.2 g/dL (ref 32.0–36.0)
MCV: 87.9 fL (ref 80.0–100.0)
Platelets: 344 10*3/uL (ref 150–440)
RBC: 4.01 MIL/uL — ABNORMAL LOW (ref 4.40–5.90)
RDW: 13.2 % (ref 11.5–14.5)
WBC: 9.9 10*3/uL (ref 3.8–10.6)

## 2017-01-25 LAB — BASIC METABOLIC PANEL
ANION GAP: 12 (ref 5–15)
Anion gap: 10 (ref 5–15)
BUN: 22 mg/dL — ABNORMAL HIGH (ref 6–20)
BUN: 12 mg/dL (ref 6–20)
CHLORIDE: 94 mmol/L — AB (ref 101–111)
CHLORIDE: 96 mmol/L — AB (ref 101–111)
CO2: 25 mmol/L (ref 22–32)
CO2: 25 mmol/L (ref 22–32)
Calcium: 8.2 mg/dL — ABNORMAL LOW (ref 8.9–10.3)
Calcium: 8.3 mg/dL — ABNORMAL LOW (ref 8.9–10.3)
Creatinine, Ser: 0.97 mg/dL (ref 0.61–1.24)
Creatinine, Ser: 0.81 mg/dL (ref 0.61–1.24)
GFR calc Af Amer: >60 mL/min (ref >60)
GFR calc Af Amer: >60 mL/min (ref >60)
GFR calc non Af Amer: >60 mL/min (ref >60)
GLUCOSE: 94 mg/dL (ref 65–99)
Glucose, Bld: 91 mg/dL (ref 65–99)
POTASSIUM: 2.9 mmol/L — AB (ref 3.5–5.1)
POTASSIUM: 3.3 mmol/L — AB (ref 3.5–5.1)
SODIUM: 131 mmol/L — AB (ref 135–145)
Sodium: 131 mmol/L — ABNORMAL LOW (ref 135–145)

## 2017-01-25 LAB — MAGNESIUM: Magnesium: 2.3 mg/dL (ref 1.7–2.4)

## 2017-01-25 MED ORDER — PANTOPRAZOLE SODIUM 40 MG PO TBEC
40.0000 mg | DELAYED_RELEASE_TABLET | Freq: Every day | ORAL | Status: DC
  Administered 2017-01-25 – 2017-01-26 (×2): 40 mg via ORAL
  Filled 2017-01-25 (×2): qty 1

## 2017-01-25 MED ORDER — POTASSIUM CHLORIDE 10 MEQ/100ML IV SOLN
10.0000 meq | INTRAVENOUS | Status: AC
  Administered 2017-01-25 (×3): 10 meq via INTRAVENOUS
  Filled 2017-01-25 (×3): qty 100

## 2017-01-25 MED ORDER — POTASSIUM CHLORIDE 10 MEQ/100ML IV SOLN
10.0000 meq | INTRAVENOUS | Status: AC
  Administered 2017-01-25 (×4): 10 meq via INTRAVENOUS
  Filled 2017-01-25 (×3): qty 100

## 2017-01-25 MED ORDER — MORPHINE SULFATE (PF) 2 MG/ML IV SOLN: 2.0000 mg | INTRAVENOUS | Status: DC | PRN

## 2017-01-25 MED ORDER — MORPHINE SULFATE (PF) 2 MG/ML IV SOLN: 1.0000 mg | INTRAVENOUS | Status: DC | PRN

## 2017-01-25 NOTE — Progress Notes (Addendum)
[place] [facility] [place] NAME: Dakota Bowen    MR#:  [id number]  DATE OF BIRTH:  12/24/1980  SUBJECTIVE:  Patient came in with intractable vomiting.  His urine drug screen is positive for cocaine and amphetamine opiates.  Patient admits to doing drugs. No more vomiting.  Patient was to try some popsicle.   REVIEW OF SYSTEMS:   Review of Systems  Constitutional: Negative for chills, fever and weight loss.  HENT: Negative for ear discharge, ear pain and nosebleeds.   Eyes: Negative for blurred vision, pain and discharge.  Respiratory: Negative for sputum production, shortness of breath, wheezing and stridor.   Cardiovascular: Negative for chest pain, palpitations, orthopnea and PND.  Gastrointestinal: Positive for abdominal pain and nausea. Negative for diarrhea and vomiting.  Genitourinary: Negative for frequency and urgency.  Musculoskeletal: Negative for back pain and joint pain.  Neurological: Positive for weakness. Negative for sensory change, speech change and focal weakness.  Psychiatric/Behavioral: Negative for depression and hallucinations. The patient is not nervous/anxious.    Tolerating Diet:started now on clears Tolerating PT: pending  DRUG ALLERGIES:   Allergies  Allergen Reactions  . Penicillins Itching, Rash and Other (See Comments)    VITALS:  Blood pressure 105/78, pulse 87, temperature 98.1 F (36.7 C), temperature source Oral, resp. rate 19, height 6' (1.829 m), weight 68 kg (150 lb), SpO2 100 %.  PHYSICAL EXAMINATION:   Physical Exam  GENERAL:  37 y.o.-year-old patient lying in the bed with no acute distress.  EYES: Pupils equal, round, reactive to light and accommodation. No scleral icterus. Extraocular muscles intact.  HEENT: Head atraumatic, normocephalic. Oropharynx and nasopharynx clear.  NECK:  Supple, no jugular venous distention. No thyroid enlargement, no tenderness.  LUNGS: Normal breath sounds  bilaterally, no wheezing, rales, rhonchi. No use of accessory muscles of respiration.  CARDIOVASCULAR: S1, S2 normal. No murmurs, rubs, or gallops.  ABDOMEN: Soft, nontender, nondistended. Bowel sounds present. No organomegaly or mass.  EXTREMITIES: No cyanosis, clubbing or edema b/l.    NEUROLOGIC: Cranial nerves II through XII are intact. No focal Motor or sensory deficits b/l.   PSYCHIATRIC:  patient is alert and oriented x 3.  SKIN: No obvious rash, lesion, or ulcer.   LABORATORY PANEL:  CBC Recent Labs  Lab 01/25/17 0339  WBC 9.9  HGB 12.4*  HCT 35.2*  PLT 344    Chemistries  Recent Labs  Lab 01/24/17 1430 01/25/17 0339  NA 127* 131*  K 3.0* 2.9*  CL 83* 94*  CO2 24 25  GLUCOSE 103* 94  BUN 32* 22*  CREATININE 1.21 0.97  CALCIUM 9.4 8.2*  AST 46*  --   ALT 29  --   ALKPHOS 120  --   BILITOT 1.8*  --    Cardiac Enzymes No results for input(s): TROPONINI in the last 168 hours. RADIOLOGY:  Dg Abdomen Acute W/chest  Result Date: 01/24/2017 CLINICAL DATA:  Cyclical vomiting syndrome, vomiting for 4 days, dehydration, weakness EXAM: DG ABDOMEN ACUTE W/ 1V CHEST COMPARISON:  Chest radiograph 02/24/2016, abdominal radiograph 03/01/2016 FINDINGS: Normal heart size, mediastinal contours, and pulmonary vascularity. Minimal chronic peribronchial thickening. Lungs otherwise clear. No infiltrate, pleural effusion, or pneumothorax. Old RIGHT rib fractures. Surgical clips RIGHT upper quadrant from prior cholecystectomy. Nonobstructive bowel gas pattern. No bowel dilatation, bowel wall thickening or free air. Small pelvic phleboliths without definite urinary tract calcification. IMPRESSION: No acute abnormalities. Electronically Signed   By: Crist Infante.D.  On: 01/24/2017 17:28   ASSESSMENT AND PLAN:  Dakota Bowen  is a 37 y.o. male with a known history of nausea vomiting for 4 days.  He states that he has a history of cyclic vomiting syndrome.  He has been vomiting too many  times to count.  Occasionally the vomit is pinkish.  He is feeling very weak and dehydrated.    1.   recurrent cyclic vomiting syndrome -No more vomiting.  Mild nausea and dry heaves -Patient wants to try clear liquids =  Standing dose of Zofran.  PRN Phenergan.  -Receiving IV fluid hydration.   - Patient advised to stop using marijuana and cocaine.  This could be part of his issue.  Could be cannabis hyperemesis syndrome.    2. Hyponatremia and hypokalemia.  Replace magnesium.  IV fluids with potassium in it  3.  Substance abuse with cocaine and marijuana.  I advised him to stop the substances because they are probably contributing to his issue. -I have told patient I will not be prescribing any narcotics given his history of substance abuse.  4.  Tobacco abuse.  Smoking cessation counseling done 4 minutes by me.  Nicotine patch applied  5.  Leukocytosis likely secondary to vomiting  Once electrolytes were replaced and patient tolerating oral diet will discharge patient to home. pt is agreeable.  Case discussed with Care Management/Social Worker. Management plans discussed with the patient, family and they are in agreement.  CODE STATUS: full  DVT Prophylaxis: lovenox  TOTAL TIME TAKING CARE OF THIS PATIENT: 30  minutes.  >50% time spent on counselling and coordination of care  POSSIBLE D/C IN1DAYS, DEPENDING ON CLINICAL CONDITION.  Note: This dictation was prepared with Dragon dictation along with smaller phrase technology. Any transcriptional errors that result from this process are unintentional.  Fritzi Mandes M.D on 01/25/2017 at 8:30 AM  Between 7am to 6pm - Pager - [phone]  After 6pm go to [email or web address] - password [email or web address] [facility]  Office  [phone]  CC: Primary care physician; Albina Billet, MDPatient ID: Dakota Bowen, male   DOB: 03/14/1980, 37 y.o.   MRN: [id number]

## 2017-01-25 NOTE — Consult Note (Signed)
MEDICATION RELATED CONSULT NOTE - INITIAL   Pharmacy Consult for electrolytes Indication: hypokalemia due to cyclic vomiting  Patient Measurements: Height: 6' (182.9 cm) Weight: 150 lb (68 kg) IBW/kg (Calculated) : 77.6 Adjusted Body Weight:   Vital Signs: Temp: 98.1 F (36.7 C) (01/11 0449) Temp Source: Oral (01/11 0449) BP: 105/78 (01/11 0449) Pulse Rate: 87 (01/11 0449) Intake/Output from previous day: 01/10 0701 - 01/11 0700 In: 1054 [I.V.:1004; IV Piggyback:50] Out: 1300 [Urine:1300] Intake/Output from this shift: No intake/output data recorded.  Labs: Recent Labs    01/24/17 1430 01/25/17 0339  WBC 18.8* 9.9  HGB 15.0 12.4*  HCT 42.5 35.2*  PLT 429 344  CREATININE 1.21 0.97  ALBUMIN 5.0  --   PROT 8.2*  --   AST 46*  --   ALT 29  --   ALKPHOS 120  --   BILITOT 1.8*  --    Estimated Creatinine Clearance: 99.3 mL/min (by C-G formula based on SCr of 0.97 mg/dL).   Microbiology: No results found for this or any previous visit (from the past 720 hour(s)).  Medical History: Past Medical History:  Diagnosis Date  . Cyclical vomiting syndrome   . GERD (gastroesophageal reflux disease)   . GERD (gastroesophageal reflux disease)     Medications:  Scheduled:  . budesonide (PULMICORT) nebulizer solution  0.5 mg Nebulization BID  . enoxaparin (LOVENOX) injection  40 mg Subcutaneous Q24H  . ipratropium-albuterol  3 mL Nebulization Q6H  . ondansetron (ZOFRAN) IV  4 mg Intravenous Q6H  . pantoprazole (PROTONIX) IV  40 mg Intravenous Q12H    Assessment: Pt is a 37 year old male with cyclic vomiting syndrome. Pt has been vomiting for 5 days. He is dehydrated with electrolyte abnormalities. Patient has been on NS w/ 20 KCL @ 136ml/hr since last evening. He received Mg 2g IV yesterday. Starting on clear liquid diet this AM. K this AM is 2.9. Scr and BUN improving  Goal of Therapy:  K 3.5-5 Mg 1.8-2.4  Plan:  Continue NS w/ 20 KCL (2MEQ/hr). I will give KCL 10  MEQ IV x 5 doses. MD would like to d/c today if tolerating clears. Would prefer not to give po as this can cause GI upset. Will recheck K at 1500. Add on Mg ordered. Will replace if low.  Ramond Dial, Pharm.D, BCPS Clinical Pharmacist 01/25/2017,8:31 AM

## 2017-01-25 NOTE — Plan of Care (Signed)
Pt still with frequent c/o pain and nausea. Pt did vomit 300cc. Pain meds d/c as pt exhibits drug seeking behaviors. Potassium infusing for low K+

## 2017-01-25 NOTE — Consult Note (Signed)
MEDICATION RELATED CONSULT NOTE - INITIAL   Pharmacy Consult for electrolytes Indication: hypokalemia due to cyclic vomiting  Patient Measurements: Height: 6' (182.9 cm) Weight: 150 lb (68 kg) IBW/kg (Calculated) : 77.6 Adjusted Body Weight:   Vital Signs: Temp: 98.2 F (36.8 C) (01/11 1237) Temp Source: Oral (01/11 0859) BP: 113/89 (01/11 1237) Pulse Rate: 84 (01/11 1237) Intake/Output from previous day: 01/10 0701 - 01/11 0700 In: 1054 [I.V.:1004; IV Piggyback:50] Out: 1300 [Urine:1300] Intake/Output from this shift: Total I/O In: 1520 [P.O.:720; I.V.:500; IV Piggyback:300] Out: 500 [Urine:500]  Labs: Recent Labs    01/24/17 1430 01/25/17 0339 01/25/17 1520  WBC 18.8* 9.9  --   HGB 15.0 12.4*  --   HCT 42.5 35.2*  --   PLT 429 344  --   CREATININE 1.21 0.97 0.81  MG  --  2.3  --   ALBUMIN 5.0  --   --   PROT 8.2*  --   --   AST 46*  --   --   ALT 29  --   --   ALKPHOS 120  --   --   BILITOT 1.8*  --   --    Estimated Creatinine Clearance: 118.9 mL/min (by C-G formula based on SCr of 0.81 mg/dL).  Sodium (mmol/L)  Date Value  01/25/2017 131 (L)  09/23/2013 139   Potassium (mmol/L)  Date Value  01/25/2017 3.3 (L)  09/23/2013 3.6   Magnesium (mg/dL)  Date Value  01/25/2017 2.3    Microbiology: No results found for this or any previous visit (from the past 720 hour(s)).  Medical History: Past Medical History:  Diagnosis Date  . Cyclical vomiting syndrome   . GERD (gastroesophageal reflux disease)   . GERD (gastroesophageal reflux disease)     Medications:  Scheduled:  . budesonide (PULMICORT) nebulizer solution  0.5 mg Nebulization BID  . enoxaparin (LOVENOX) injection  40 mg Subcutaneous Q24H  . ipratropium-albuterol  3 mL Nebulization Q6H  . ondansetron (ZOFRAN) IV  4 mg Intravenous Q6H  . pantoprazole  40 mg Oral Daily    Assessment: Pt is a 37 year old male with cyclic vomiting syndrome. Pt has been vomiting for 5 days. He is  dehydrated with electrolyte abnormalities. Patient has been on NS w/ 20 KCL @ 133ml/hr since last evening. He received Mg 2g IV yesterday. Starting on clear liquid diet this AM. K this AM is 2.9. Scr and BUN improving  Goal of Therapy:  K 3.5-5 Mg 1.8-2.4  Plan:   K = 3.3. Per nurse note, patient is continuing to have nausea and vomiting (300 cc). Patient received potassium 10 mEq x 4 prior to lab being drawn and has continued to receive NS w/ 20 KCL since evening of 1/10. Will give additional potassium 23mEq IV x3 and recheck K with AM labs. Would prefer not to give PO replacement at this time as it can cause GI upset.   Lendon Ka, PharmD Pharmacy Resident 01/25/2017,4:34 PM

## 2017-01-26 LAB — BASIC METABOLIC PANEL
Anion gap: 8 (ref 5–15)
BUN: 5 mg/dL — AB (ref 6–20)
CHLORIDE: 101 mmol/L (ref 101–111)
CO2: 23 mmol/L (ref 22–32)
CREATININE: 0.78 mg/dL (ref 0.61–1.24)
Calcium: 8.7 mg/dL — ABNORMAL LOW (ref 8.9–10.3)
GFR calc Af Amer: >60 mL/min (ref >60)
GLUCOSE: 94 mg/dL (ref 65–99)
Potassium: 3.1 mmol/L — ABNORMAL LOW (ref 3.5–5.1)
SODIUM: 132 mmol/L — AB (ref 135–145)

## 2017-01-26 LAB — HIV ANTIBODY (ROUTINE TESTING W REFLEX): HIV Screen 4th Generation wRfx: NONREACTIVE

## 2017-01-26 MED ORDER — IPRATROPIUM-ALBUTEROL 0.5-2.5 (3) MG/3ML IN SOLN: 3.0000 mL | Freq: Four times a day (QID) | RESPIRATORY_TRACT | Status: DC | PRN

## 2017-01-26 MED ORDER — PROMETHAZINE HCL 25 MG PO TABS: 12.5000 mg | ORAL_TABLET | Freq: Four times a day (QID) | ORAL | 0 refills | Status: DC | PRN

## 2017-01-26 MED ORDER — POTASSIUM CHLORIDE CRYS ER 20 MEQ PO TBCR
40.0000 meq | EXTENDED_RELEASE_TABLET | Freq: Once | ORAL | Status: AC
  Administered 2017-01-26: 40 meq via ORAL
  Filled 2017-01-26: qty 2

## 2017-01-26 NOTE — Care Management Note (Signed)
Case Management Note  Patient Details  Name: Dakota Bowen MRN: [id number] Date of Birth: 1980-11-27  Subjective/Objective:     Mr Dauzat was provided with a GoodRx discount coupon for Phenergan.                Action/Plan:   Expected Discharge Date:  01/26/17               Expected Discharge Plan:     In-House Referral:     Discharge planning Services     Post Acute Care Choice:    Choice offered to:     DME Arranged:    DME Agency:     HH Arranged:    HH Agency:     Status of Service:     If discussed at [employer], dates discussed:    Additional Comments:  Jodiann Ognibene A, RN 01/26/2017, 12:38 PM

## 2017-01-26 NOTE — Discharge Summary (Signed)
[facility] NAME: Dakota Bowen    MR#:  [id number]  DATE OF BIRTH:  January 08, 1981  DATE OF ADMISSION:  01/24/2017 ADMITTING PHYSICIAN: Loletha Grayer, MD  DATE OF DISCHARGE: 01/26/2017  PRIMARY CARE PHYSICIAN: Albina Billet, MD    ADMISSION DIAGNOSIS:  Dehydration [E86.0] Hypochloremic alkalosis [E87.3] Hyponatremia [E87.1] Non-intractable vomiting with nausea, unspecified vomiting type [R11.2]  DISCHARGE DIAGNOSIS:  Active Problems:   Cyclic vomiting syndrome   SECONDARY DIAGNOSIS:   Past Medical History:  Diagnosis Date  . Cyclical vomiting syndrome   . GERD (gastroesophageal reflux disease)   . GERD (gastroesophageal reflux disease)     HOSPITAL COURSE:   Dakota Bowen a37 y.o.malewith a known history of nausea vomiting for 4 days. He states that he has a history of cyclic vomiting syndrome.  1. Intractable nausea and vomiting-patient presented with multiple episodes of nausea vomiting. -This was secondary to cyclical vomiting syndrome from suspected marijuana abuse. Patient was treated supportively with IV fluids, antiemetics. -But supportive care as symptoms have improved. His diet was slowly advanced from clears to a regular diet which she is not tolerating without any further nausea vomiting abdominal pain and therefore is being discharged home.  2. Hyponatremia/hypokalemia-this was secondary to the intractable nausea vomiting. This has since then been supplemented and has improved.  3. Polysubstance abuse-patient's drug screen was positive for amphetamines, cocaine, opiates and also marijuana. -Patient had no evidence of Withdrawal. He was strongly advised to abstain from substance abuse.  4. Leukocytosis-secondary to stress from the intractable nausea vomiting. Noted significant infectious source. White cell count has now normalized.  DISCHARGE CONDITIONS:   Stable  CONSULTS OBTAINED:    DRUG  ALLERGIES:   PCN  DISCHARGE MEDICATIONS:   Allergies as of 01/26/2017      Reactions   Penicillins Itching, Rash, Other (See Comments)   Has patient had a PCN reaction causing immediate rash, facial/tongue/throat swelling, SOB or lightheadedness with hypotension: No Has patient had a PCN reaction causing severe rash involving mucus membranes or skin necrosis: NO Has patient had a PCN reaction that required hospitalization NO Has patient had a PCN reaction occurring within the last 10 years: No If all of the above answers are "NO", then may proceed with Cephalosporin use.      Medication List    TAKE these medications   promethazine 25 MG tablet Commonly known as:  PHENERGAN Take 0.5 tablets (12.5 mg total) by mouth every 6 (six) hours as needed for nausea or vomiting.          DISCHARGE INSTRUCTIONS:   DIET:  Regular diet  DISCHARGE CONDITION:  Stable  ACTIVITY:  Activity as tolerated  OXYGEN:  Home Oxygen: No.   Oxygen Delivery: room air  DISCHARGE LOCATION:  home   If you experience worsening of your admission symptoms, develop shortness of breath, life threatening emergency, suicidal or homicidal thoughts you must seek medical attention immediately by calling 911 or calling your MD immediately  if symptoms less severe.  You Must read complete instructions/literature along with all the possible adverse reactions/side effects for all the Medicines you take and that have been prescribed to you. Take any new Medicines after you have completely understood and accpet all the possible adverse reactions/side effects.   Please note  You were cared for by a hospitalist during your hospital stay. If you have any questions about your discharge medications or the care you received while you were in the  hospital after you are discharged, you can call the unit and asked to speak with the hospitalist on call if the hospitalist that took care of you is not available. Once you  are discharged, your primary care physician will handle any further medical issues. Please note that NO REFILLS for any discharge medications will be authorized once you are discharged, as it is imperative that you return to your primary care physician (or establish a relationship with a primary care physician if you do not have one) for your aftercare needs so that they can reassess your need for medications and monitor your lab values.     Today   Tolerating clear liquids diet well. We'll advance to regular diet today at tolerating well and DC home later. No other acute events overnight.  VITAL SIGNS:  Blood pressure 111/83, pulse 87, temperature 98 F (36.7 C), resp. rate 19, height 6' (1.829 m), weight 68 kg (150 lb), SpO2 100 %.  I/O:    Intake/Output Summary (Last 24 hours) at 01/26/2017 1349 Last data filed at 01/26/2017 1250 Gross per 24 hour  Intake 3801.67 ml  Output 4450 ml  Net -648.33 ml    PHYSICAL EXAMINATION:  GENERAL:  37 y.o.-year-old patient lying in the bed with no acute distress.  EYES: Pupils equal, round, reactive to light and accommodation. No scleral icterus. Extraocular muscles intact.  HEENT: Head atraumatic, normocephalic. Oropharynx and nasopharynx clear.  NECK:  Supple, no jugular venous distention. No thyroid enlargement, no tenderness.  LUNGS: Normal breath sounds bilaterally, no wheezing, rales,rhonchi. No use of accessory muscles of respiration.  CARDIOVASCULAR: S1, S2 normal. No murmurs, rubs, or gallops.  ABDOMEN: Soft, non-tender, non-distended. Bowel sounds present. No organomegaly or mass.  EXTREMITIES: No pedal edema, cyanosis, or clubbing.  NEUROLOGIC: Cranial nerves II through XII are intact. No focal motor or sensory defecits b/l.  PSYCHIATRIC: The patient is alert and oriented x 3.  SKIN: No obvious rash, lesion, or ulcer.   DATA REVIEW:   CBC Recent Labs  Lab 01/25/17 0339  WBC 9.9  HGB 12.4*  HCT 35.2*  PLT 344     Chemistries  Recent Labs  Lab 01/24/17 1430 01/25/17 0339  01/26/17 0653  NA 127* 131*   < > 132*  K 3.0* 2.9*   < > 3.1*  CL 83* 94*   < > 101  CO2 24 25   < > 23  GLUCOSE 103* 94   < > 94  BUN 32* 22*   < > 5*  CREATININE 1.21 0.97   < > 0.78  CALCIUM 9.4 8.2*   < > 8.7*  MG  --  2.3  --   --   AST 46*  --   --   --   ALT 29  --   --   --   ALKPHOS 120  --   --   --   BILITOT 1.8*  --   --   --    < > = values in this interval not displayed.    Cardiac Enzymes No results for input(s): TROPONINI in the last 168 hours.  Microbiology Results  No results found for this or any previous visit.  RADIOLOGY:  Dg Abdomen Acute W/chest  Result Date: 01/24/2017 CLINICAL DATA:  Cyclical vomiting syndrome, vomiting for 4 days, dehydration, weakness EXAM: DG ABDOMEN ACUTE W/ 1V CHEST COMPARISON:  Chest radiograph 02/24/2016, abdominal radiograph 03/01/2016 FINDINGS: Normal heart size, mediastinal contours, and pulmonary vascularity. Minimal chronic peribronchial  thickening. Lungs otherwise clear. No infiltrate, pleural effusion, or pneumothorax. Old RIGHT rib fractures. Surgical clips RIGHT upper quadrant from prior cholecystectomy. Nonobstructive bowel gas pattern. No bowel dilatation, bowel wall thickening or free air. Small pelvic phleboliths without definite urinary tract calcification. IMPRESSION: No acute abnormalities. Electronically Signed   By: Lavonia Dana M.D.   On: 01/24/2017 17:28      Management plans discussed with the patient, family and they are in agreement.  CODE STATUS:     Code Status Orders  (From admission, onward)        Start     Ordered   01/24/17 1646  Full code  Continuous     01/24/17 1645      TOTAL TIME TAKING CARE OF THIS PATIENT: 40 minutes.    Henreitta Leber M.D on 01/26/2017 at 1:49 PM  Between 7am to 6pm - Pager - [phone]  After 6pm go to [email or web address] - Proofreader  [facility]  Office   [phone]  CC: Primary care physician; Albina Billet, MD

## 2017-01-26 NOTE — Progress Notes (Signed)
Pt was able to participate in discharge teaching. PIV removed. Pt continued to voice his concerns about not feeling like he should be sent home while he was vomiting. Pt not witnessed vomiting and was told to alert nursing when he did. Pt was given a prescription for nausea meds and instructed by MD that he would be discharged today.   Pt refused to sign d/c paperwork.

## 2017-07-19 ENCOUNTER — Other Ambulatory Visit: Payer: Self-pay

## 2017-07-19 ENCOUNTER — Emergency Department
Admission: EM | Admit: 2017-07-19 | Discharge: 2017-07-20 | Disposition: A | Discharge status: Home / Self Care | Payer: Self-pay | Attending: Emergency Medicine | Admitting: Emergency Medicine

## 2017-07-19 ENCOUNTER — Encounter: Payer: Self-pay | Admitting: Emergency Medicine

## 2017-07-19 DIAGNOSIS — F1110 Opioid abuse, uncomplicated: Secondary | ICD-10-CM | POA: Insufficient documentation

## 2017-07-19 DIAGNOSIS — R51 Headache: Secondary | ICD-10-CM | POA: Insufficient documentation

## 2017-07-19 DIAGNOSIS — F1410 Cocaine abuse, uncomplicated: Secondary | ICD-10-CM | POA: Insufficient documentation

## 2017-07-19 DIAGNOSIS — R638 Other symptoms and signs concerning food and fluid intake: Secondary | ICD-10-CM | POA: Insufficient documentation

## 2017-07-19 DIAGNOSIS — E860 Dehydration: Secondary | ICD-10-CM | POA: Insufficient documentation

## 2017-07-19 DIAGNOSIS — F17210 Nicotine dependence, cigarettes, uncomplicated: Secondary | ICD-10-CM | POA: Insufficient documentation

## 2017-07-19 DIAGNOSIS — F1910 Other psychoactive substance abuse, uncomplicated: Secondary | ICD-10-CM | POA: Insufficient documentation

## 2017-07-19 DIAGNOSIS — F1210 Cannabis abuse, uncomplicated: Secondary | ICD-10-CM | POA: Insufficient documentation

## 2017-07-19 LAB — COMPREHENSIVE METABOLIC PANEL
ALT: 163 U/L — ABNORMAL HIGH (ref 0–44)
ANION GAP: 14 (ref 5–15)
AST: 108 U/L — ABNORMAL HIGH (ref 15–41)
Albumin: 5.1 g/dL — ABNORMAL HIGH (ref 3.5–5.0)
Alkaline Phosphatase: 124 U/L (ref 38–126)
BUN: 16 mg/dL (ref 6–20)
CHLORIDE: 100 mmol/L (ref 98–111)
CO2: 24 mmol/L (ref 22–32)
CREATININE: 0.98 mg/dL (ref 0.61–1.24)
Calcium: 9.9 mg/dL (ref 8.9–10.3)
GFR calc Af Amer: >60 mL/min (ref >60)
Glucose, Bld: 112 mg/dL — ABNORMAL HIGH (ref 70–99)
Potassium: 2.9 mmol/L — ABNORMAL LOW (ref 3.5–5.1)
Sodium: 138 mmol/L (ref 135–145)
Total Bilirubin: 1 mg/dL (ref 0.3–1.2)
Total Protein: 8.3 g/dL — ABNORMAL HIGH (ref 6.5–8.1)

## 2017-07-19 LAB — URINE DRUG SCREEN, QUALITATIVE (ARMC ONLY)
AMPHETAMINES, UR SCREEN: NOT DETECTED
BENZODIAZEPINE, UR SCRN: POSITIVE — AB
Cannabinoid 50 Ng, Ur ~~LOC~~: POSITIVE — AB
Cocaine Metabolite,Ur ~~LOC~~: POSITIVE — AB
MDMA (Ecstasy)Ur Screen: NOT DETECTED
METHADONE SCREEN, URINE: NOT DETECTED
OPIATE, UR SCREEN: NOT DETECTED
Phencyclidine (PCP) Ur S: NOT DETECTED
Tricyclic, Ur Screen: NOT DETECTED

## 2017-07-19 LAB — CBC
HEMATOCRIT: 44.2 % (ref 40.0–52.0)
Hemoglobin: 15.6 g/dL (ref 13.0–18.0)
MCH: 30.5 pg (ref 26.0–34.0)
MCHC: 35.3 g/dL (ref 32.0–36.0)
MCV: 86.6 fL (ref 80.0–100.0)
Platelets: 307 10*3/uL (ref 150–440)
RBC: 5.11 MIL/uL (ref 4.40–5.90)
RDW: 14.1 % (ref 11.5–14.5)
WBC: 9 10*3/uL (ref 3.8–10.6)

## 2017-07-19 LAB — GLUCOSE, CAPILLARY: Glucose-Capillary: 88 mg/dL (ref 70–99)

## 2017-07-19 LAB — ETHANOL

## 2017-07-19 MED ORDER — SODIUM CHLORIDE 0.9 % IV BOLUS
1000.0000 mL | Freq: Once | INTRAVENOUS | Status: AC
  Administered 2017-07-19: 1000 mL via INTRAVENOUS

## 2017-07-19 MED ORDER — POTASSIUM CHLORIDE CRYS ER 20 MEQ PO TBCR
40.0000 meq | EXTENDED_RELEASE_TABLET | Freq: Once | ORAL | Status: AC
  Administered 2017-07-19: 40 meq via ORAL
  Filled 2017-07-19: qty 2

## 2017-07-19 NOTE — BH Assessment (Signed)
Patient has been accepted to [facility] will arrive to pick up the pt at 1AM Please have pt call the [facility] office to speak with the RN on duty [phone] (ASAP)

## 2017-07-19 NOTE — ED Notes (Signed)
Patient resting quietly with eyes closed in no acute distress at this time.

## 2017-07-19 NOTE — ED Notes (Signed)
Attempted to call [facility]. Mark RN stated that they were still in report and to call back at midnight.

## 2017-07-19 NOTE — ED Notes (Signed)
Patient given sprite and crackers and given an update on wait time.

## 2017-07-19 NOTE — ED Triage Notes (Addendum)
PT to ED via POV with desire to go through drug rehab, pt last use of drugs was this am. PT states he uses " everything" including ETOH. Pt A&OX4, calm and cooperative. PT needs to be medically cleared. HR 135 in triage.  Pt denies SI/HI

## 2017-07-19 NOTE — BH Assessment (Signed)
Per the request of ER MD (Dr. Joni Fears) [facility] spoke with patient in order to start the referral to a Substance Treatment facility.  Writer discussed with the patient about the option for detox/treatment with [facility].  Patient was in the agreement with the plan.  Patient signed the Release of information, in order to send labs and ER Note.  Pending UDS results, in order to send complete referral.

## 2017-07-19 NOTE — ED Provider Notes (Addendum)
[facility] Emergency Department Provider Note  ____________________________________________  Time seen: Approximately 5:33 PM  I have reviewed the triage vital signs and the nursing notes.   HISTORY  Chief Complaint Medical Clearance    HPI Dakota Bowen is a 37 y.o. male with a past history of GERD and polysubstance abuse including cocaine, heroin, methamphetamine, benzodiazepines, marijuana, alcohol who comes to the ED for evaluation due to desire to start a detox program.  Last drug use was at 1:00 PM today with IV cocaine and heroin.  Denies chest pain.  Has a headache that is been going on all day, gradual onset, bilateral frontal, nonradiating, no aggravating or alleviating factors, no associated vision change paresthesias or weakness.  Not thunderclap.  He feels dehydrated, notes that he has not had anything to eat or drink today and often has a low appetite which he attributes to his drug use which is daily.  No recent alcohol use, denies history of alcohol withdrawal.      Past Medical History:  Diagnosis Date  . Cyclical vomiting syndrome   . GERD (gastroesophageal reflux disease)   . GERD (gastroesophageal reflux disease)      Patient Active Problem List   Diagnosis Date Noted  . Cyclic vomiting syndrome 01/24/2017  . Benzodiazepine overdose 09/24/2016  . 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 Intractable vomiting 02/24/2016  . Acute gastritis 05/19/2015  . Leukocytosis 05/19/2015  . Hyperglycemia 05/19/2015  . LLQ pain 05/19/2015  . Constipation 05/19/2015  . Nausea & vomiting 05/16/2015  . Malnutrition of moderate degree ([place]) 08/11/2014  . Hematemesis with nausea   . Hematemesis 08/09/2014  . Duodenitis 06/22/2014  . Gastritis 06/17/2014  . Vomiting 06/15/2014     Past Surgical History:  Procedure Laterality Date  . CHOLECYSTECTOMY    . ESOPHAGOGASTRODUODENOSCOPY (EGD) WITH  PROPOFOL N/A 06/21/2014   Procedure: ESOPHAGOGASTRODUODENOSCOPY (EGD) WITH PROPOFOL. Looking in the esophagus stomach and upper small intestine with a lighted tube to examine and treat.;  Surgeon: Lollie Sails, MD;  Location: [facility] ENDOSCOPY;  Service: Endoscopy;  Laterality: N/A;     Prior to Admission medications   Medication Sig Start Date End Date Taking? Authorizing Provider  promethazine (PHENERGAN) 25 MG tablet Take 0.5 tablets (12.5 mg total) by mouth every 6 (six) hours as needed for nausea or vomiting. 01/26/17   Henreitta Leber, MD     Allergies Penicillins   Family History  Problem Relation Age of Onset  . Diabetes Mother   . Cancer Mother   . Diabetes Father     Social History Social History   Tobacco Use  . Smoking status: Current Every Day Smoker    Packs/day: 1.50  . Smokeless tobacco: Never Used  Substance Use Topics  . Alcohol use: Yes    Comment: 3-4beers/day  . Drug use: Yes    Types: Marijuana, Cocaine, Heroin    Review of Systems  Constitutional:   No fever or chills.  ENT:   No sore throat. No rhinorrhea. Cardiovascular:   No chest pain or syncope. Respiratory:   No dyspnea or cough. Gastrointestinal:   Negative for abdominal pain, vomiting and diarrhea.  Musculoskeletal:   Negative for focal pain or swelling All other systems reviewed and are negative except as documented above in ROS and HPI.  ____________________________________________   PHYSICAL EXAM:  VITAL SIGNS: ED Triage Vitals  Enc Vitals Group     BP 07/19/17 1543 118/86  Pulse Rate 07/19/17 1543 (!) 135     Resp 07/19/17 1630 17     Temp 07/19/17 1543 98.8 F (37.1 C)     Temp Source 07/19/17 1543 Oral     SpO2 07/19/17 1543 96 %     Weight 07/19/17 1702 145 lb (65.8 kg)     Height 07/19/17 1702 6' (1.829 m)     Head Circumference --      Peak Flow --      Pain Score 07/19/17 1546 7     Pain Loc --      Pain Edu? --      Excl. in [place]? --     Vital signs  reviewed, nursing assessments reviewed.   Constitutional:   Alert and oriented. Non-toxic appearance. Eyes:   Conjunctivae are normal. EOMI. PERRL. ENT      Head:   Normocephalic and atraumatic.      Nose:   No congestion/rhinnorhea.       Mouth/Throat:   Dry mucous membranes, no pharyngeal erythema. No peritonsillar mass.       Neck:   No meningismus. Full ROM. Hematological/Lymphatic/Immunilogical:   No cervical lymphadenopathy. Cardiovascular:   Tachycardia heart rate 110. Symmetric bilateral radial and DP pulses.  No murmurs.  Respiratory:   Normal respiratory effort without tachypnea/retractions. Breath sounds are clear and equal bilaterally. No wheezes/rales/rhonchi. Gastrointestinal:   Soft and nontender. Non distended. There is no CVA tenderness.  No rebound, rigidity, or guarding. Musculoskeletal:   Normal range of motion in all extremities. No joint effusions.  No lower extremity tenderness.  No edema. Neurologic:   Normal speech and language.  Motor grossly intact. No acute focal neurologic deficits are appreciated.  Skin:    Skin is warm, dry and intact. No rash noted.  No petechiae, purpura, or bullae.  Track marks on bilateral hands, no inflammatory changes.  ____________________________________________    LABS (pertinent positives/negatives) (all labs ordered are listed, but only abnormal results are displayed) Labs Reviewed  COMPREHENSIVE METABOLIC PANEL - Abnormal; Notable for the following components:      Result Value   Potassium 2.9 (*)    Glucose, Bld 112 (*)    Total Protein 8.3 (*)    Albumin 5.1 (*)    AST 108 (*)    ALT 163 (*)    All other components within normal limits  ETHANOL  CBC  GLUCOSE, CAPILLARY  URINE DRUG SCREEN, QUALITATIVE ([facility] ONLY)   ____________________________________________   EKG  Interpreted by me Sinus tachycardia rate 136, normal axis intervals QRS ST segments and T waves.  ____________________________________________     RADIOLOGY  No results found.  ____________________________________________   PROCEDURES Procedures  ____________________________________________    CLINICAL IMPRESSION / ASSESSMENT AND PLAN / ED COURSE  Pertinent labs & imaging results that were available during my care of the patient were reviewed by me and considered in my medical decision making (see chart for details).    Patient is well-appearing, nontoxic.  Has mild tachycardia at this point but stable vital signs.  Likely due to dehydration.  No concerning symptoms related to his recent drug abuse, no evidence of sedation or autonomic instability.  No evidence of skin or soft tissue infection related to IV drug use or endocarditis.  He is medically stable.  I will give him IV fluids for hydration, refer to substance abuse treatment.  Clinical Course as of Jul 20 2006  Fri Jul 19, 2017  1830 Tachycardia resolved, vital signs normal.   [  PS]  2002 UDS positive for cocaine, cannabinoids, benzos.  As expected.  Vital signs remain normal.  Asymptomatic currently.  Medically stable.   [PS]    Clinical Course User Index [PS] Carrie Mew, MD     ____________________________________________   FINAL CLINICAL IMPRESSION(S) / ED DIAGNOSES    Final diagnoses:  Polysubstance abuse ([place])  Dehydration     ED Discharge Orders    None      Portions of this note were generated with dragon dictation software. Dictation errors may occur despite best attempts at proofreading.    Carrie Mew, MD 07/19/17 1742    Carrie Mew, MD 07/19/17 2008

## 2017-07-19 NOTE — ED Notes (Signed)
Blanket provided

## 2017-07-20 ENCOUNTER — Emergency Department: Admission: EM | Admit: 2017-07-20 | Discharge: 2017-07-20 | Discharge status: Against Medical Advice | Payer: Self-pay

## 2017-07-20 NOTE — ED Notes (Signed)
Patient speaking with Patsy from [facility] at this time.

## 2017-07-20 NOTE — ED Notes (Signed)
Pt in hallway attempting to leave facility with IV still in place, pt being detained by Officer Aurora Mask while RN Sharyn Lull and [employer]. Pt attempts to remove IV himself, RN Michelle holding pressure at this time to IV site. Pt becoming verbally abusive to officer and staff at this time

## 2017-07-20 NOTE — ED Notes (Signed)
Dr. Kerman Passey notified that patient was not accepted to [facility]

## 2017-07-20 NOTE — ED Provider Notes (Signed)
-----------------------------------------   12:44 AM on 07/20/2017 -----------------------------------------  Patient has been rejected for [facility].  I discussed with the patient that another option would be [facility], he says he is ready to leave.  States "just give me my God damn papers so I can leave."  Patient will be discharged from the emergency department.  We will provide information for [facility] for the patient.   Harvest Dark, MD 07/20/17 [phone]

## 2017-07-20 NOTE — ED Notes (Signed)
Patient given verbal discharge instructions. Patient attempted to leave with IV in hand. Patient instructed that patient he could not leave with IV. Patient continued to walk out of the room with IV in place. Patient stopped by security and patient pulled IV out. Patient refused to sign for discharge.

## 2017-07-20 NOTE — ED Notes (Signed)
Patient states that he was not accepted [facility]. [facility] notified.

## 2017-07-20 NOTE — ED Notes (Signed)
Patient checking back in as he was just discharged three minutes prior. This RN spoke with patient to ask why he was checking back in and he states "Because I said I wanted to MFuc*&ing go downstairs." This RN explained to him that he was told by the EDP we do not admit detox downstairs and that he was given three other options to call. He called [facility] while in the room but became belligerent with them and he hung up on them. He was also given [facility] which do not do phone acceptance. When I explained all of this a second time he started to cuss and blame hospitals for "not caring" and we should "do our Ryan*(* jobs." Patient was told by the [employer] officer at the desk to watch his language and he started with foul language again.

## 2017-07-20 NOTE — BH Assessment (Signed)
This Probation officer visited with pt at bedside to inquire about admission phone call status. Pt stated to this writer that he did not want to go to [facility] tonight because it was so late and he just wanted to stay here in the ED.   This Probation officer spoke with pt's RN Sharyn Lull in regards to pt going to [facility]. She called at midnight as requested by the RN on duty at [facility] and gave the phone to Mr Frontera. She reports that the pt requested to stay in the ED over night to "go through detox here". This Probation officer advised the pt's RN that [facility] does not have a detox program and that we refer pts to [facility].   Per RN Patsy at [facility] the pt was declined due to his excessive use of methamphetamines and unwilling disposition.She reports that the pt asked question such as "What are the lunches like there? Do y'all keep people in cells or cages?" She states that the pt "Isn't ready for this type of program."

## 2017-10-27 ENCOUNTER — Other Ambulatory Visit: Payer: Self-pay

## 2017-10-27 ENCOUNTER — Emergency Department
Admission: EM | Admit: 2017-10-27 | Discharge: 2017-10-27 | Disposition: A | Discharge status: Home / Self Care | Payer: Self-pay | Attending: Emergency Medicine | Admitting: Emergency Medicine

## 2017-10-27 ENCOUNTER — Encounter: Payer: Self-pay | Admitting: Emergency Medicine

## 2017-10-27 DIAGNOSIS — Z5321 Procedure and treatment not carried out due to patient leaving prior to being seen by health care provider: Secondary | ICD-10-CM | POA: Insufficient documentation

## 2017-10-27 DIAGNOSIS — R1110 Vomiting, unspecified: Secondary | ICD-10-CM | POA: Insufficient documentation

## 2017-10-27 LAB — COMPREHENSIVE METABOLIC PANEL
ALBUMIN: 5.5 g/dL — AB (ref 3.5–5.0)
ALT: 17 U/L (ref 0–44)
AST: 19 U/L (ref 15–41)
Alkaline Phosphatase: 70 U/L (ref 38–126)
Anion gap: 14 (ref 5–15)
BUN: 22 mg/dL — AB (ref 6–20)
CHLORIDE: 93 mmol/L — AB (ref 98–111)
CO2: 28 mmol/L (ref 22–32)
CREATININE: 0.82 mg/dL (ref 0.61–1.24)
Calcium: 10 mg/dL (ref 8.9–10.3)
GFR calc Af Amer: >60 mL/min (ref >60)
GFR calc non Af Amer: >60 mL/min (ref >60)
GLUCOSE: 117 mg/dL — AB (ref 70–99)
Potassium: 3.2 mmol/L — ABNORMAL LOW (ref 3.5–5.1)
SODIUM: 135 mmol/L (ref 135–145)
Total Bilirubin: 0.9 mg/dL (ref 0.3–1.2)
Total Protein: 9 g/dL — ABNORMAL HIGH (ref 6.5–8.1)

## 2017-10-27 LAB — CBC
HEMATOCRIT: 49.2 % (ref 39.0–52.0)
Hemoglobin: 16.7 g/dL (ref 13.0–17.0)
MCH: 29.5 pg (ref 26.0–34.0)
MCHC: 33.9 g/dL (ref 30.0–36.0)
MCV: 86.9 fL (ref 80.0–100.0)
NRBC: 0 % (ref 0.0–0.2)
Platelets: 379 10*3/uL (ref 150–400)
RBC: 5.66 MIL/uL (ref 4.22–5.81)
RDW: 14.4 % (ref 11.5–15.5)
WBC: 16 10*3/uL — ABNORMAL HIGH (ref 4.0–10.5)

## 2017-10-27 LAB — LIPASE, BLOOD: LIPASE: 57 U/L — AB (ref 11–51)

## 2017-10-27 MED ORDER — ONDANSETRON 4 MG PO TBDP
4.0000 mg | ORAL_TABLET | Freq: Once | ORAL | Status: AC | PRN
  Administered 2017-10-27: 4 mg via ORAL
  Filled 2017-10-27: qty 1

## 2017-10-27 NOTE — ED Notes (Addendum)
Pt ambulatory in lobby. RN offered a wheelchair but pt refused. Pt walking inside and outside periodically.

## 2017-10-27 NOTE — ED Notes (Signed)
Called for patient again with no response.

## 2017-10-27 NOTE — ED Notes (Signed)
RN called for patient to recheck vitals. No answer from patient.

## 2017-10-27 NOTE — ED Triage Notes (Addendum)
Here for cyclical vomiting syndrome. VSS, HR WNL. Pt reports history of same. Still using marijuana.  Also had had diarrhea. No fevers.  Denies pain just feels bad from vomiting.  No vomiting in triage

## 2017-10-28 ENCOUNTER — Other Ambulatory Visit: Payer: Self-pay

## 2017-10-28 ENCOUNTER — Inpatient Hospital Stay
Admission: EM | Admit: 2017-10-28 | Discharge: 2017-10-29 | DRG: 683 | Disposition: A | Discharge status: Home / Self Care | Payer: Self-pay | Attending: Family Medicine | Admitting: Family Medicine

## 2017-10-28 ENCOUNTER — Encounter: Payer: Self-pay | Admitting: Intensive Care

## 2017-10-28 DIAGNOSIS — F1220 Cannabis dependence, uncomplicated: Secondary | ICD-10-CM | POA: Diagnosis present

## 2017-10-28 DIAGNOSIS — E876 Hypokalemia: Secondary | ICD-10-CM | POA: Diagnosis present

## 2017-10-28 DIAGNOSIS — E872 Acidosis: Secondary | ICD-10-CM | POA: Diagnosis present

## 2017-10-28 DIAGNOSIS — I4581 Long QT syndrome: Secondary | ICD-10-CM | POA: Diagnosis present

## 2017-10-28 DIAGNOSIS — R000 Tachycardia, unspecified: Secondary | ICD-10-CM | POA: Diagnosis present

## 2017-10-28 DIAGNOSIS — Z9049 Acquired absence of other specified parts of digestive tract: Secondary | ICD-10-CM

## 2017-10-28 DIAGNOSIS — E860 Dehydration: Secondary | ICD-10-CM | POA: Diagnosis present

## 2017-10-28 DIAGNOSIS — R34 Anuria and oliguria: Secondary | ICD-10-CM

## 2017-10-28 DIAGNOSIS — R1115 Cyclical vomiting syndrome unrelated to migraine: Secondary | ICD-10-CM | POA: Diagnosis present

## 2017-10-28 DIAGNOSIS — Z833 Family history of diabetes mellitus: Secondary | ICD-10-CM

## 2017-10-28 DIAGNOSIS — F17210 Nicotine dependence, cigarettes, uncomplicated: Secondary | ICD-10-CM | POA: Diagnosis present

## 2017-10-28 DIAGNOSIS — K219 Gastro-esophageal reflux disease without esophagitis: Secondary | ICD-10-CM | POA: Diagnosis present

## 2017-10-28 DIAGNOSIS — Z880 Allergy status to penicillin: Secondary | ICD-10-CM

## 2017-10-28 DIAGNOSIS — N179 Acute kidney failure, unspecified: Principal | ICD-10-CM | POA: Diagnosis present

## 2017-10-28 LAB — COMPREHENSIVE METABOLIC PANEL
ALBUMIN: 5.5 g/dL — AB (ref 3.5–5.0)
ALT: 17 U/L (ref 0–44)
AST: 23 U/L (ref 15–41)
Alkaline Phosphatase: 69 U/L (ref 38–126)
Anion gap: 18 — ABNORMAL HIGH (ref 5–15)
BILIRUBIN TOTAL: 1.4 mg/dL — AB (ref 0.3–1.2)
BUN: 34 mg/dL — AB (ref 6–20)
CHLORIDE: 85 mmol/L — AB (ref 98–111)
CO2: 28 mmol/L (ref 22–32)
Calcium: 10 mg/dL (ref 8.9–10.3)
Creatinine, Ser: 1.31 mg/dL — ABNORMAL HIGH (ref 0.61–1.24)
GFR calc Af Amer: >60 mL/min (ref >60)
GFR calc non Af Amer: >60 mL/min (ref >60)
GLUCOSE: 119 mg/dL — AB (ref 70–99)
POTASSIUM: 2.9 mmol/L — AB (ref 3.5–5.1)
SODIUM: 131 mmol/L — AB (ref 135–145)
TOTAL PROTEIN: 8.8 g/dL — AB (ref 6.5–8.1)

## 2017-10-28 LAB — CBC
HEMATOCRIT: 53 % — AB (ref 39.0–52.0)
HEMOGLOBIN: 18.4 g/dL — AB (ref 13.0–17.0)
MCH: 29.8 pg (ref 26.0–34.0)
MCHC: 34.7 g/dL (ref 30.0–36.0)
MCV: 85.8 fL (ref 80.0–100.0)
Platelets: 373 10*3/uL (ref 150–400)
RBC: 6.18 MIL/uL — AB (ref 4.22–5.81)
RDW: 13.9 % (ref 11.5–15.5)
WBC: 13.1 10*3/uL — ABNORMAL HIGH (ref 4.0–10.5)
nRBC: 0 % (ref 0.0–0.2)

## 2017-10-28 LAB — LIPASE, BLOOD: Lipase: 45 U/L (ref 11–51)

## 2017-10-28 LAB — LACTIC ACID, PLASMA: LACTIC ACID, VENOUS: 2.3 mmol/L — AB (ref 0.5–1.9)

## 2017-10-28 LAB — TROPONIN I: Troponin I: <0.03 ng/mL (ref <0.03)

## 2017-10-28 MED ORDER — ONDANSETRON 4 MG PO TBDP
4.0000 mg | ORAL_TABLET | Freq: Once | ORAL | Status: AC | PRN
  Administered 2017-10-28: 4 mg via ORAL
  Filled 2017-10-28: qty 1

## 2017-10-28 MED ORDER — HALOPERIDOL LACTATE 5 MG/ML IJ SOLN
10.0000 mg | Freq: Once | INTRAMUSCULAR | Status: AC
  Administered 2017-10-28: 10 mg via INTRAVENOUS
  Filled 2017-10-28 (×2): qty 2

## 2017-10-28 MED ORDER — MAGNESIUM SULFATE 2 GM/50ML IV SOLN
INTRAVENOUS | Status: AC
  Administered 2017-10-28: 2 g via INTRAVENOUS
  Filled 2017-10-28: qty 50

## 2017-10-28 MED ORDER — SODIUM CHLORIDE 0.9 % IV BOLUS
1000.0000 mL | Freq: Once | INTRAVENOUS | Status: AC
  Administered 2017-10-28: 1000 mL via INTRAVENOUS

## 2017-10-28 MED ORDER — DIPHENHYDRAMINE HCL 50 MG/ML IJ SOLN
INTRAMUSCULAR | Status: AC
  Administered 2017-10-28: 25 mg via INTRAVENOUS
  Filled 2017-10-28: qty 1

## 2017-10-28 MED ORDER — ACETAMINOPHEN 325 MG PO TABS: 650.0000 mg | ORAL_TABLET | Freq: Four times a day (QID) | ORAL | Status: DC | PRN

## 2017-10-28 MED ORDER — POTASSIUM CHLORIDE CRYS ER 20 MEQ PO TBCR
40.0000 meq | EXTENDED_RELEASE_TABLET | Freq: Once | ORAL | Status: AC
  Administered 2017-10-28: 40 meq via ORAL
  Filled 2017-10-28: qty 2

## 2017-10-28 MED ORDER — MAGNESIUM SULFATE 2 GM/50ML IV SOLN
2.0000 g | Freq: Once | INTRAVENOUS | Status: AC
  Administered 2017-10-28: 2 g via INTRAVENOUS

## 2017-10-28 MED ORDER — ONDANSETRON HCL 4 MG/2ML IJ SOLN: 4.0000 mg | Freq: Four times a day (QID) | INTRAMUSCULAR | Status: DC | PRN

## 2017-10-28 MED ORDER — HYDROCODONE-ACETAMINOPHEN 5-325 MG PO TABS
1.0000 | ORAL_TABLET | ORAL | Status: DC | PRN
  Administered 2017-10-29: 2 via ORAL
  Filled 2017-10-28: qty 2

## 2017-10-28 MED ORDER — KCL-LACTATED RINGERS 20 MEQ/L IV SOLN
INTRAVENOUS | Status: DC
  Filled 2017-10-28 (×3): qty 1000

## 2017-10-28 MED ORDER — BISACODYL 5 MG PO TBEC: 5.0000 mg | DELAYED_RELEASE_TABLET | Freq: Every day | ORAL | Status: DC | PRN

## 2017-10-28 MED ORDER — DOCUSATE SODIUM 100 MG PO CAPS: 100.0000 mg | ORAL_CAPSULE | Freq: Two times a day (BID) | ORAL | Status: DC

## 2017-10-28 MED ORDER — ACETAMINOPHEN 650 MG RE SUPP: 650.0000 mg | Freq: Four times a day (QID) | RECTAL | Status: DC | PRN

## 2017-10-28 MED ORDER — ONDANSETRON HCL 4 MG PO TABS: 4.0000 mg | ORAL_TABLET | Freq: Four times a day (QID) | ORAL | Status: DC | PRN

## 2017-10-28 MED ORDER — POTASSIUM CHLORIDE 2 MEQ/ML IV SOLN
INTRAVENOUS | Status: DC
  Administered 2017-10-29: 01:00:00 via INTRAVENOUS
  Filled 2017-10-28 (×2): qty 1000

## 2017-10-28 MED ORDER — HEPARIN SODIUM (PORCINE) 5000 UNIT/ML IJ SOLN
5000.0000 [IU] | Freq: Three times a day (TID) | INTRAMUSCULAR | Status: DC
  Administered 2017-10-29: 5000 [IU] via SUBCUTANEOUS
  Filled 2017-10-28: qty 1

## 2017-10-28 MED ORDER — DEXTROSE-NACL 5-0.45 % IV SOLN
Freq: Once | INTRAVENOUS | Status: AC
  Administered 2017-10-28: 18:00:00 via INTRAVENOUS

## 2017-10-28 MED ORDER — DIPHENHYDRAMINE HCL 50 MG/ML IJ SOLN
25.0000 mg | Freq: Once | INTRAMUSCULAR | Status: AC
  Administered 2017-10-28: 25 mg via INTRAVENOUS

## 2017-10-28 MED ORDER — SODIUM CHLORIDE 0.9 % IV SOLN: INTRAVENOUS | Status: DC

## 2017-10-28 NOTE — ED Notes (Signed)
Lab called to add on troponin to previous collection

## 2017-10-28 NOTE — H&P (Signed)
Dakota Bowen at [place] NAME: Dakota Bowen    MR#:  [id number]  DATE OF BIRTH:  12-16-1980  DATE OF ADMISSION:  10/28/2017  PRIMARY CARE PHYSICIAN: Albina Billet, MD   REQUESTING/REFERRING PHYSICIAN:   CHIEF COMPLAINT:   Chief Complaint  Patient presents with  . Abdominal Pain    HISTORY OF PRESENT ILLNESS: Dakota Bowen  is a 37 y.o. male with a known history of cyclic vomiting syndrome tobacco abuse and marijuana abuse and GERD. Patient presented to emergency room for epigastric pain, nausea and vomiting going on for the past 3 days, gradually getting worse.  Patient has not been able to keep any food or even liquids down in the past 3 days.  He has tried marijuana at home hoping that it would help with the symptoms, but no improvement.  No fever, chills, diarrhea, bleeding, no chest pain, no shortness of breath. Blood test done emergency room are notable for elevated lactic acid at 2.5 low potassium level at 2.9, creatinine 1.3, WBC 13.1. Patient is admitted for further evaluation and treatment.   PAST MEDICAL HISTORY:   Past Medical History:  Diagnosis Date  . Cyclical vomiting syndrome   . GERD (gastroesophageal reflux disease)   . GERD (gastroesophageal reflux disease)     PAST SURGICAL HISTORY:  Past Surgical History:  Procedure Laterality Date  . CHOLECYSTECTOMY    . ESOPHAGOGASTRODUODENOSCOPY (EGD) WITH PROPOFOL N/A 06/21/2014   Procedure: ESOPHAGOGASTRODUODENOSCOPY (EGD) WITH PROPOFOL. Looking in the esophagus stomach and upper small intestine with a lighted tube to examine and treat.;  Surgeon: Lollie Sails, MD;  Location: [facility] ENDOSCOPY;  Service: Endoscopy;  Laterality: N/A;    SOCIAL HISTORY:  Social History   Tobacco Use  . Smoking status: Current Every Day Smoker    Packs/day: 1.50  . Smokeless tobacco: Never Used  Substance Use Topics  . Alcohol use: Yes    Comment: 3-4beers/day    FAMILY  HISTORY:  Family History  Problem Relation Age of Onset  . Diabetes Mother   . Cancer Mother   . Diabetes Father     DRUG ALLERGIES: Penicillin   REVIEW OF SYSTEMS:   CONSTITUTIONAL: No fever, fatigue or weakness.  EYES: No changes in vision.  EARS, NOSE, AND THROAT: No tinnitus or ear pain.  RESPIRATORY: No cough, shortness of breath, wheezing or hemoptysis.  CARDIOVASCULAR: No chest pain, orthopnea, edema.  GASTROINTESTINAL: Positive for nausea, vomiting, and epigastric abdominal pain.  No diarrhea, bleeding. GENITOURINARY: No dysuria, hematuria.  ENDOCRINE: No polyuria, nocturia. HEMATOLOGY: No bleeding. SKIN: No rash or lesion. MUSCULOSKELETAL: No joint pain at this time.   NEUROLOGIC: No focal weakness.  PSYCHIATRY: No anxiety or depression.   MEDICATIONS AT HOME:  Prior to Admission medications   Medication Sig Start Date End Date Taking? Authorizing Provider  promethazine (PHENERGAN) 25 MG tablet Take 0.5 tablets (12.5 mg total) by mouth every 6 (six) hours as needed for nausea or vomiting. Patient not taking: Reported on 10/28/2017 01/26/17   Henreitta Leber, MD      PHYSICAL EXAMINATION:   VITAL SIGNS: Blood pressure 121/79, pulse 98, temperature 98.3 F (36.8 C), temperature source Oral, resp. rate 18, height 6' (1.829 m), weight 68 kg, SpO2 99 %.  GENERAL:  37 y.o.-year-old patient lying in the bed with moderate distress.  EYES: Pupils equal, round, reactive to light and accommodation. No scleral icterus. Extraocular muscles intact.  HEENT: Head atraumatic, normocephalic. Oropharynx and  nasopharynx clear.  NECK:  Supple, no jugular venous distention. No thyroid enlargement, no tenderness.  LUNGS: Normal breath sounds bilaterally, no wheezing, rales,rhonchi or crepitation. No use of accessory muscles of respiration.  CARDIOVASCULAR: S1, S2 normal. No S3/S4.  ABDOMEN: There is mild tenderness with palpation in the epigastric area.  Otherwise, the abdomen is  soft, nondistended. Bowel sounds present. No organomegaly or mass.  EXTREMITIES: No pedal edema, cyanosis, or clubbing.  NEUROLOGIC: No focal weakness. PSYCHIATRIC: The patient is alert and oriented x 3.  SKIN: No obvious rash, lesion, or ulcer.   LABORATORY PANEL:   CBC Recent Labs  Lab 10/27/17 1219 10/28/17 1720  WBC 16.0* 13.1*  HGB 16.7 18.4*  HCT 49.2 53.0*  PLT 379 373  MCV 86.9 85.8  MCH 29.5 29.8  MCHC 33.9 34.7  RDW 14.4 13.9   ------------------------------------------------------------------------------------------------------------------  Chemistries  Recent Labs  Lab 10/27/17 1219 10/28/17 1720  NA 135 131*  K 3.2* 2.9*  CL 93* 85*  CO2 28 28  GLUCOSE 117* 119*  BUN 22* 34*  CREATININE 0.82 1.31*  CALCIUM 10.0 10.0  AST 19 23  ALT 17 17  ALKPHOS 70 69  BILITOT 0.9 1.4*   ------------------------------------------------------------------------------------------------------------------ estimated creatinine clearance is 72.8 mL/min (A) (by C-G formula based on SCr of 1.31 mg/dL (H)). ------------------------------------------------------------------------------------------------------------------ No results for input(s): TSH, T4TOTAL, T3FREE, THYROIDAB in the last 72 hours.  Invalid input(s): FREET3   Coagulation profile No results for input(s): INR, PROTIME in the last 168 hours. ------------------------------------------------------------------------------------------------------------------- No results for input(s): DDIMER in the last 72 hours. -------------------------------------------------------------------------------------------------------------------  Cardiac Enzymes Recent Labs  Lab 10/28/17 1720  TROPONINI <0.03   ------------------------------------------------------------------------------------------------------------------ Invalid input(s):  POCBNP  ---------------------------------------------------------------------------------------------------------------  Urinalysis    Component Value Date/Time   COLORURINE AMBER (A) 01/24/2017 1427   APPEARANCEUR CLEAR 01/24/2017 1427   APPEARANCEUR Clear 09/23/2013 1902   LABSPEC 1.020 01/24/2017 1427   LABSPEC 1.024 09/23/2013 1902   PHURINE 7.0 01/24/2017 1427   GLUCOSEU NEGATIVE 01/24/2017 1427   GLUCOSEU 150 mg/dL 09/23/2013 1902   HGBUR NEGATIVE 01/24/2017 1427   BILIRUBINUR MODERATE (A) 01/24/2017 1427   BILIRUBINUR Negative 09/23/2013 1902   KETONESUR >160 (A) 01/24/2017 1427   PROTEINUR 30 (A) 01/24/2017 1427   NITRITE NEGATIVE 01/24/2017 1427   LEUKOCYTESUR NEGATIVE 01/24/2017 1427   LEUKOCYTESUR Negative 09/23/2013 1902     RADIOLOGY: No results found.  EKG: Orders placed or performed during the hospital encounter of 10/28/17  . EKG 12-Lead  . EKG 12-Lead  . EKG 12-Lead  . EKG 12-Lead    IMPRESSION AND PLAN:  1.  Intractable nausea and vomiting, likely secondary to cyclic vomiting syndrome.  Continue supportive treatment with IV fluids and antinausea medications.  Will replace electrolytes as needed. 2.  Acute renal failure, likely prerenal, secondary to dehydration from poor p.o. intake and vomiting.  We will start IV fluids and monitor kidney function closely.  Avoid nephrotoxic medications. 3.  Hypokalemia, will replace potassium per protocol. 4.  Lactic acidosis, likely secondary to profound dehydration from vomiting.  Continue to follow lactic acid level while treating the underlying disease. 5.  Tobacco and marijuana abuse.  Cessation was discussed with patient in detail.  All the records are reviewed and case discussed with ED provider. Management plans discussed with the patient, family and they are in agreement.  CODE STATUS: Full Code Status History    Date Active Date Inactive Code Status Order ID Comments User Context   01/24/2017 1645  01/26/2017 2038  Full Code [id number]  Loletha Grayer, MD ED   02/24/2016 1452 03/01/2016 1646 Full Code [id number]  Vaughan Basta, MD Inpatient   05/16/2015 0802 05/19/2015 1544 Full Code [id number]  Saundra Shelling, MD Inpatient   08/09/2014 2357 08/13/2014 1817 Full Code [id number]  Nicholes Mango, MD Inpatient   06/15/2014 1402 06/22/2014 2157 Full Code [id number]  Hillary Bow, MD ED       TOTAL TIME TAKING CARE OF THIS PATIENT: 50 minutes.    Amelia Jo M.D on 10/28/2017 at 10:09 PM  Between 7am to 6pm - Pager - [phone]  After 6pm go to [email or web address] - password [email or web address] [facility] [place] [facility]  [phone]  CC: Primary care physician; Albina Billet, MD

## 2017-10-28 NOTE — ED Triage Notes (Signed)
Patient c/o epigastric pain with N/V since saturday

## 2017-10-28 NOTE — ED Provider Notes (Addendum)
[facility] Emergency Department Provider Note    First MD Initiated Contact with Patient 10/28/17 1807     (approximate)  I have reviewed the triage vital signs and the nursing notes.   HISTORY  Chief Complaint Abdominal Pain    HPI Dakota Bowen is a 37 y.o. male with a history of cyclic vomiting syndrome is GERD since the ER for epigastric pain nausea and vomiting since Saturday.  Is not been able to keep any food or even liquids down since then.  Initially checked to the ER yesterday but was unwilling to wait.  States he is been dealing with episodes of this for quite some time and was hoping that it would pass.  Denies any chest pain.  No shortness of breath.  Denies any fevers at home.    Past Medical History:  Diagnosis Date  . Cyclical vomiting syndrome   . GERD (gastroesophageal reflux disease)   . GERD (gastroesophageal reflux disease)    Family History  Problem Relation Age of Onset  . Diabetes Mother   . Cancer Mother   . Diabetes Father    Past Surgical History:  Procedure Laterality Date  . CHOLECYSTECTOMY    . ESOPHAGOGASTRODUODENOSCOPY (EGD) WITH PROPOFOL N/A 06/21/2014   Procedure: ESOPHAGOGASTRODUODENOSCOPY (EGD) WITH PROPOFOL. Looking in the esophagus stomach and upper small intestine with a lighted tube to examine and treat.;  Surgeon: Lollie Sails, MD;  Location: [facility] ENDOSCOPY;  Service: Endoscopy;  Laterality: N/A;   Patient Active Problem List   Diagnosis Date Noted  . Cyclic vomiting syndrome 01/24/2017  . Benzodiazepine overdose 09/24/2016  . 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 Intractable vomiting 02/24/2016  . Acute gastritis 05/19/2015  . Leukocytosis 05/19/2015  . Hyperglycemia 05/19/2015  . LLQ pain 05/19/2015  . Constipation 05/19/2015  . Nausea & vomiting 05/16/2015  . Malnutrition of moderate degree ([place]) 08/11/2014  . Hematemesis with nausea   .  Hematemesis 08/09/2014  . Duodenitis 06/22/2014  . Gastritis 06/17/2014  . Vomiting 06/15/2014      Prior to Admission medications   Medication Sig Start Date End Date Taking? Authorizing Provider  promethazine (PHENERGAN) 25 MG tablet Take 0.5 tablets (12.5 mg total) by mouth every 6 (six) hours as needed for nausea or vomiting. 01/26/17   Henreitta Leber, MD    Allergies Penicillins    Social History Social History   Tobacco Use  . Smoking status: Current Every Day Smoker    Packs/day: 1.50  . Smokeless tobacco: Never Used  Substance Use Topics  . Alcohol use: Yes    Comment: 3-4beers/day  . Drug use: Yes    Types: Marijuana, Cocaine, Heroin    Review of Systems Patient denies headaches, rhinorrhea, blurry vision, numbness, shortness of breath, chest pain, edema, cough, abdominal pain, nausea, vomiting, diarrhea, dysuria, fevers, rashes or hallucinations unless otherwise stated above in HPI. ____________________________________________   PHYSICAL EXAM:  VITAL SIGNS: Vitals:   10/28/17 1707 10/28/17 2018  BP: 115/88 121/79  Pulse: (!) 133 98  Resp: 20 18  Temp: 98.3 F (36.8 C)   SpO2: 100% 99%    Constitutional: Alert and oriented.  Eyes: Conjunctivae are normal.  Head: Atraumatic. Nose: No congestion/rhinnorhea. Mouth/Throat: Mucous membranes are moist.   Neck: No stridor. Painless ROM.  Cardiovascular: mild tachycardia regular rhythm. Grossly normal heart sounds.  Good peripheral circulation. Respiratory: Normal respiratory effort.  No retractions. Lungs CTAB. Gastrointestinal: Soft and nontender. No  distention. No abdominal bruits. No CVA tenderness. Genitourinary: deferred Musculoskeletal: No lower extremity tenderness nor edema.  No joint effusions. Neurologic:  Normal speech and language. No gross focal neurologic deficits are appreciated. No facial droop Skin:  Skin is warm, dry and intact. No rash noted. Psychiatric: Mood and affect are normal.  Speech and behavior are normal.  ____________________________________________   LABS (all labs ordered are listed, but only abnormal results are displayed)  Results for orders placed or performed during the hospital encounter of 10/28/17 (from the past 24 hour(s))  Lipase, blood     Status: None   Collection Time: 10/28/17  5:20 PM  Result Value Ref Range   Lipase 45 11 - 51 U/L  Comprehensive metabolic panel     Status: Abnormal   Collection Time: 10/28/17  5:20 PM  Result Value Ref Range   Sodium 131 (L) 135 - 145 mmol/L   Potassium 2.9 (L) 3.5 - 5.1 mmol/L   Chloride 85 (L) 98 - 111 mmol/L   CO2 28 22 - 32 mmol/L   Glucose, Bld 119 (H) 70 - 99 mg/dL   BUN 34 (H) 6 - 20 mg/dL   Creatinine, Ser 1.31 (H) 0.61 - 1.24 mg/dL   Calcium 10.0 8.9 - 10.3 mg/dL   Total Protein 8.8 (H) 6.5 - 8.1 g/dL   Albumin 5.5 (H) 3.5 - 5.0 g/dL   AST 23 15 - 41 U/L   ALT 17 0 - 44 U/L   Alkaline Phosphatase 69 38 - 126 U/L   Total Bilirubin 1.4 (H) 0.3 - 1.2 mg/dL   GFR calc non Af Amer >60 >60 mL/min   GFR calc Af Amer >60 >60 mL/min   Anion gap 18 (H) 5 - 15  CBC     Status: Abnormal   Collection Time: 10/28/17  5:20 PM  Result Value Ref Range   WBC 13.1 (H) 4.0 - 10.5 K/uL   RBC 6.18 (H) 4.22 - 5.81 MIL/uL   Hemoglobin 18.4 (H) 13.0 - 17.0 g/dL   HCT 53.0 (H) 39.0 - 52.0 %   MCV 85.8 80.0 - 100.0 fL   MCH 29.8 26.0 - 34.0 pg   MCHC 34.7 30.0 - 36.0 g/dL   RDW 13.9 11.5 - 15.5 %   Platelets 373 150 - 400 K/uL   nRBC 0.0 0.0 - 0.2 %  Lactic acid, plasma     Status: Abnormal   Collection Time: 10/28/17  6:39 PM  Result Value Ref Range   Lactic Acid, Venous 2.3 (HH) 0.5 - 1.9 mmol/L   ____________________________________________  EKG My review and personal interpretation at Time: 17:13   Indication: N/V  Rate: 130  Rhythm: sinus Axis: normal Other: inferolateral st abn, prolonged qtc, no stemi ____________________________________________  RADIOLOGY  I personally reviewed all  radiographic images ordered to evaluate for the above acute complaints and reviewed radiology reports and findings.  These findings were personally discussed with the patient.  Please see medical record for radiology report.  ____________________________________________   PROCEDURES  Procedure(s) performed:  .Critical Care Performed by: Merlyn Lot, MD Authorized by: Merlyn Lot, MD   Critical care provider statement:    Critical care time (minutes):  45   Critical care time was exclusive of:  Separately billable procedures and treating other patients   Critical care was necessary to treat or prevent imminent or life-threatening deterioration of the following conditions:  Dehydration   Critical care was time spent personally by me on the following activities:  Development of treatment plan with patient or surrogate, discussions with consultants, evaluation of patient's response to treatment, examination of patient, obtaining history from patient or surrogate, ordering and performing treatments and interventions, ordering and review of laboratory studies, ordering and review of radiographic studies, pulse oximetry, re-evaluation of patient's condition and review of old charts      Critical Care performed: yes ____________________________________________   INITIAL IMPRESSION / ASSESSMENT AND PLAN / ED COURSE  Pertinent labs & imaging results that were available during my care of the patient were reviewed by me and considered in my medical decision making (see chart for details).   DDX: Enteritis, SBO, cyclic vomiting, lecture light on normality, AKI, sepsis  Dakota Bowen is a 37 y.o. who presents to the ED with symptoms as described above.  Patient does appear severely dehydrated.  He is tachycardic poorly perfused.  Blood work does show significant hemoconcentration and mild AKI with mild hypokalemia.  Will give D5 as well is normal saline bolus as well as IV Haldol and  states he does have a history of cyclic vomiting syndrome and did not have any improvement with Zofran.  The patient will be placed on continuous pulse oximetry and telemetry for monitoring.  Laboratory evaluation will be sent to evaluate for the above complaints.     Clinical Course as of Oct 29 2119  Mon Oct 28, 2017  2039 Patient reassessed.  Is received 2 L and is feeling better but is "afraid to try something to eat ".  Does appear more consistent with his cyclic vomiting syndrome.  His abdomen is soft and benign.  Certainly is dehydrated but lactic acid is only mildly elevated.  Still has not been able to provide any urine.  I recommended admission to the hospital for additional IV fluids but patient states that he wants to go home.  Will p.o. challenge.   [PR]    Clinical Course User Index [PR] Merlyn Lot, MD     As part of my medical decision making, I reviewed the following data within the [place] notes reviewed and incorporated, Labs reviewed, notes from prior ED visits and [place] Controlled Substance Database   ____________________________________________   FINAL CLINICAL IMPRESSION(S) / ED DIAGNOSES  Final diagnoses:  Dehydration  Cyclical vomiting  Oliguria      NEW MEDICATIONS STARTED DURING THIS VISIT:  New Prescriptions   No medications on file     Note:  This document was prepared using Dragon voice recognition software and may include unintentional dictation errors.    Merlyn Lot, MD 10/28/17 2121    Merlyn Lot, MD 10/28/17 2121

## 2017-10-28 NOTE — ED Notes (Signed)
Date and time results received: 10/28/17 1931  Test: lactic acid Critical Value: 2.3  Name of Provider Notified: dr. Quentin Cornwall  Orders Received? Or Actions Taken?: no new orders at this time

## 2017-10-29 LAB — URINALYSIS, COMPLETE (UACMP) WITH MICROSCOPIC
BACTERIA UA: NONE SEEN
BILIRUBIN URINE: NEGATIVE
Glucose, UA: 150 mg/dL — AB
KETONES UR: NEGATIVE mg/dL
LEUKOCYTES UA: NEGATIVE
Nitrite: NEGATIVE
PROTEIN: 30 mg/dL — AB
Specific Gravity, Urine: 1.012 (ref 1.005–1.030)
pH: 6 (ref 5.0–8.0)

## 2017-10-29 LAB — CBC
HEMATOCRIT: 44.4 % (ref 39.0–52.0)
HEMOGLOBIN: 15.1 g/dL (ref 13.0–17.0)
MCH: 29.6 pg (ref 26.0–34.0)
MCHC: 34 g/dL (ref 30.0–36.0)
MCV: 87.1 fL (ref 80.0–100.0)
Platelets: 264 10*3/uL (ref 150–400)
RBC: 5.1 MIL/uL (ref 4.22–5.81)
RDW: 13.9 % (ref 11.5–15.5)
WBC: 9.6 10*3/uL (ref 4.0–10.5)
nRBC: 0 % (ref 0.0–0.2)

## 2017-10-29 LAB — BASIC METABOLIC PANEL
Anion gap: 9 (ref 5–15)
BUN: 19 mg/dL (ref 6–20)
CO2: 26 mmol/L (ref 22–32)
Calcium: 8.3 mg/dL — ABNORMAL LOW (ref 8.9–10.3)
Chloride: 99 mmol/L (ref 98–111)
Creatinine, Ser: 0.72 mg/dL (ref 0.61–1.24)
GFR calc Af Amer: >60 mL/min (ref >60)
GFR calc non Af Amer: >60 mL/min (ref >60)
GLUCOSE: 104 mg/dL — AB (ref 70–99)
POTASSIUM: 3.5 mmol/L (ref 3.5–5.1)
SODIUM: 134 mmol/L — AB (ref 135–145)

## 2017-10-29 LAB — LACTIC ACID, PLASMA
LACTIC ACID, VENOUS: 0.8 mmol/L (ref 0.5–1.9)
Lactic Acid, Venous: 0.7 mmol/L (ref 0.5–1.9)

## 2017-10-29 LAB — MAGNESIUM: Magnesium: 2.5 mg/dL — ABNORMAL HIGH (ref 1.7–2.4)

## 2017-10-29 LAB — GLUCOSE, CAPILLARY: Glucose-Capillary: 91 mg/dL (ref 70–99)

## 2017-10-29 NOTE — Progress Notes (Signed)
Discharge teaching given to patient, patient verbalized understanding and had no questions. Patient IV removed. Patient will be transported home by a friend when they arrive. All patient belongings gathered prior to leaving.

## 2017-10-29 NOTE — Care Management (Signed)
Patient to discharge today.  Dakota Bowen listed as PCP.  Secretary has attempted to make follow up appointment, however they stated patient has not been to the practice in 9 years, and they are not accepting new patients.    Secretary attempted to make patient and appointment with [facility], however they are booked out until December and that schedule is not available at this time.   Patient provided with application to [facility] , no new medications at discharge.

## 2017-10-29 NOTE — Discharge Summary (Signed)
[facility] NAME: Dakota Bowen    MR#:  [id number]  DATE OF BIRTH:  04/30/1980  DATE OF ADMISSION:  10/28/2017 ADMITTING PHYSICIAN: Amelia Jo, MD  DATE OF DISCHARGE: No discharge date for patient encounter.  PRIMARY CARE PHYSICIAN: Albina Billet, MD    ADMISSION DIAGNOSIS:  Dehydration [U54.2] Cyclical vomiting [H06.23] Oliguria [R34]  DISCHARGE DIAGNOSIS:  Active Problems:   ARF (acute renal failure) ([place])   SECONDARY DIAGNOSIS:   Past Medical History:  Diagnosis Date  . Cyclical vomiting syndrome   . GERD (gastroesophageal reflux disease)   . GERD (gastroesophageal reflux disease)     HOSPITAL COURSE:  -Chronic intermittent cyclical nausea/emesis  Ongoing for over 20 years per patient  Resolved with IV fluids and antiemetics  Patient not interested in follow-up with gastroenterology   -Acute kidney injury  likely prerenal/dehydration from vomiting Resolved with IV fluids for rehydration  -Acute hypokalemia Repleted  -Acute lactic acidosis Resolved with IV fluids for rehydration  -Chronic tobacco/marijuana abuse dependency/disorder Patient counseling given  DISCHARGE CONDITIONS:   stable  CONSULTS OBTAINED:    DRUG ALLERGIES:   PCN  DISCHARGE MEDICATIONS:   Phenergan  DISCHARGE INSTRUCTIONS:  If you experience worsening of your admission symptoms, develop shortness of breath, life threatening emergency, suicidal or homicidal thoughts you must seek medical attention immediately by calling 911 or calling your MD immediately  if symptoms less severe.  You Must read complete instructions/literature along with all the possible adverse reactions/side effects for all the Medicines you take and that have been prescribed to you. Take any new Medicines after you have completely understood and accept all the possible adverse reactions/side effects.   Please note  You were cared for by a  hospitalist during your hospital stay. If you have any questions about your discharge medications or the care you received while you were in the hospital after you are discharged, you can call the unit and asked to speak with the hospitalist on call if the hospitalist that took care of you is not available. Once you are discharged, your primary care physician will handle any further medical issues. Please note that NO REFILLS for any discharge medications will be authorized once you are discharged, as it is imperative that you return to your primary care physician (or establish a relationship with a primary care physician if you do not have one) for your aftercare needs so that they can reassess your need for medications and monitor your lab values.    Today   CHIEF COMPLAINT:   Chief Complaint  Patient presents with  . Abdominal Pain    HISTORY OF PRESENT ILLNESS:  37 y.o. male with a known history of cyclic vomiting syndrome tobacco abuse and marijuana abuse and GERD. Patient presented to emergency room for epigastric pain, nausea and vomiting going on for the past 3 days, gradually getting worse.  Patient has not been able to keep any food or even liquids down in the past 3 days.  He has tried marijuana at home hoping that it would help with the symptoms, but no improvement.  No fever, chills, diarrhea, bleeding, no chest pain, no shortness of breath. Blood test done emergency room are notable for elevated lactic acid at 2.5 low potassium level at 2.9, creatinine 1.3, WBC 13.1. Patient is admitted for further evaluation and treatment. VITAL SIGNS:  Blood pressure (!) 124/94, pulse 72, temperature 98.2 F (36.8 C), temperature source Oral, resp.  rate 16, height 6' (1.829 m), weight 68 kg, SpO2 100 %.  I/O:    Intake/Output Summary (Last 24 hours) at 10/29/2017 1102 Last data filed at 10/29/2017 0011 Gross per 24 hour  Intake 3050 ml  Output 350 ml  Net 2700 ml    PHYSICAL  EXAMINATION:  GENERAL:  37 y.o.-year-old patient lying in the bed with no acute distress.  EYES: Pupils equal, round, reactive to light and accommodation. No scleral icterus. Extraocular muscles intact.  HEENT: Head atraumatic, normocephalic. Oropharynx and nasopharynx clear.  NECK:  Supple, no jugular venous distention. No thyroid enlargement, no tenderness.  LUNGS: Normal breath sounds bilaterally, no wheezing, rales,rhonchi or crepitation. No use of accessory muscles of respiration.  CARDIOVASCULAR: S1, S2 normal. No murmurs, rubs, or gallops.  ABDOMEN: Soft, non-tender, non-distended. Bowel sounds present. No organomegaly or mass.  EXTREMITIES: No pedal edema, cyanosis, or clubbing.  NEUROLOGIC: Cranial nerves II through XII are intact. Muscle strength 5/5 in all extremities. Sensation intact. Gait not checked.  PSYCHIATRIC: The patient is alert and oriented x 3.  SKIN: No obvious rash, lesion, or ulcer.   DATA REVIEW:   CBC Recent Labs  Lab 10/29/17 0428  WBC 9.6  HGB 15.1  HCT 44.4  PLT 264    Chemistries  Recent Labs  Lab 10/28/17 1720 10/29/17 0428  NA 131* 134*  K 2.9* 3.5  CL 85* 99  CO2 28 26  GLUCOSE 119* 104*  BUN 34* 19  CREATININE 1.31* 0.72  CALCIUM 10.0 8.3*  MG  --  2.5*  AST 23  --   ALT 17  --   ALKPHOS 69  --   BILITOT 1.4*  --     Cardiac Enzymes Recent Labs  Lab 10/28/17 [place] <0.03    Microbiology Results  No results found for this or any previous visit.  RADIOLOGY:  No results found.  EKG:   Orders placed or performed during the hospital encounter of 10/28/17  . EKG 12-Lead  . EKG 12-Lead  . EKG 12-Lead  . EKG 12-Lead      Management plans discussed with the patient, family and they are in agreement.  CODE STATUS: full    Code Status Orders  (From admission, onward)         Start     Ordered   10/28/17 2312  Full code  Continuous     10/28/17 2311        Code Status History    Date Active Date  Inactive Code Status Order ID Comments User Context   01/24/2017 1645 01/26/2017 2038 Full Code [id number]  Loletha Grayer, MD ED   02/24/2016 1452 03/01/2016 1646 Full Code [id number]  Vaughan Basta, MD Inpatient   05/16/2015 0802 05/19/2015 1544 Full Code [id number]  Saundra Shelling, MD Inpatient   08/09/2014 2357 08/13/2014 1817 Full Code [id number]  Nicholes Mango, MD Inpatient   06/15/2014 1402 06/22/2014 2157 Full Code [id number]  Hillary Bow, MD ED      TOTAL TIME TAKING CARE OF THIS PATIENT: 40 minutes.    Avel Peace Burney [place] M.D on 10/29/2017 at 11:02 AM  Between 7am to 6pm - Pager - [phone]  After 6pm go to [email or web address] - password [email or web address]  Office  [phone]  CC: Primary care physician; Albina Billet, MD   Note: This dictation was prepared with Dragon dictation along with smaller phrase technology. Any transcriptional errors that result from this process are unintentional.

## 2017-10-29 NOTE — Progress Notes (Signed)
Electrolyte replacement consult  Potassium and magnesium  AM K+ and Mg WNL. No replacement ordered. Recheck BMP and Mg with tomorrow AM labs.  Sim Boast, PharmD, BCPS  10/29/17 6:59 AM

## 2018-08-03 ENCOUNTER — Emergency Department: Payer: Medicaid Other

## 2018-08-03 ENCOUNTER — Encounter: Payer: Self-pay | Admitting: Emergency Medicine

## 2018-08-03 ENCOUNTER — Other Ambulatory Visit: Payer: Self-pay

## 2018-08-03 ENCOUNTER — Inpatient Hospital Stay
Admission: EM | Admit: 2018-08-03 | Discharge: 2018-08-08 | DRG: 834 | Disposition: A | Discharge status: Other Acute Inpatient Hospital | Payer: Medicaid Other | Attending: Internal Medicine | Admitting: Internal Medicine

## 2018-08-03 DIAGNOSIS — R109 Unspecified abdominal pain: Secondary | ICD-10-CM | POA: Diagnosis present

## 2018-08-03 DIAGNOSIS — I253 Aneurysm of heart: Secondary | ICD-10-CM | POA: Diagnosis present

## 2018-08-03 DIAGNOSIS — E860 Dehydration: Secondary | ICD-10-CM

## 2018-08-03 DIAGNOSIS — R7989 Other specified abnormal findings of blood chemistry: Secondary | ICD-10-CM

## 2018-08-03 DIAGNOSIS — F17210 Nicotine dependence, cigarettes, uncomplicated: Secondary | ICD-10-CM | POA: Diagnosis present

## 2018-08-03 DIAGNOSIS — R945 Abnormal results of liver function studies: Secondary | ICD-10-CM

## 2018-08-03 DIAGNOSIS — Z20828 Contact with and (suspected) exposure to other viral communicable diseases: Secondary | ICD-10-CM | POA: Diagnosis present

## 2018-08-03 DIAGNOSIS — C9500 Acute leukemia of unspecified cell type not having achieved remission: Principal | ICD-10-CM | POA: Diagnosis present

## 2018-08-03 DIAGNOSIS — D6959 Other secondary thrombocytopenia: Secondary | ICD-10-CM | POA: Diagnosis present

## 2018-08-03 DIAGNOSIS — Z880 Allergy status to penicillin: Secondary | ICD-10-CM

## 2018-08-03 DIAGNOSIS — R188 Other ascites: Secondary | ICD-10-CM | POA: Diagnosis present

## 2018-08-03 DIAGNOSIS — R112 Nausea with vomiting, unspecified: Secondary | ICD-10-CM

## 2018-08-03 DIAGNOSIS — R590 Localized enlarged lymph nodes: Secondary | ICD-10-CM

## 2018-08-03 DIAGNOSIS — K219 Gastro-esophageal reflux disease without esophagitis: Secondary | ICD-10-CM | POA: Diagnosis present

## 2018-08-03 DIAGNOSIS — R161 Splenomegaly, not elsewhere classified: Secondary | ICD-10-CM

## 2018-08-03 DIAGNOSIS — K760 Fatty (change of) liver, not elsewhere classified: Secondary | ICD-10-CM | POA: Diagnosis present

## 2018-08-03 DIAGNOSIS — K7200 Acute and subacute hepatic failure without coma: Secondary | ICD-10-CM | POA: Diagnosis present

## 2018-08-03 DIAGNOSIS — D735 Infarction of spleen: Secondary | ICD-10-CM

## 2018-08-03 DIAGNOSIS — R1010 Upper abdominal pain, unspecified: Secondary | ICD-10-CM

## 2018-08-03 DIAGNOSIS — Z23 Encounter for immunization: Secondary | ICD-10-CM

## 2018-08-03 DIAGNOSIS — R162 Hepatomegaly with splenomegaly, not elsewhere classified: Secondary | ICD-10-CM

## 2018-08-03 LAB — URINALYSIS, COMPLETE (UACMP) WITH MICROSCOPIC
Bacteria, UA: NONE SEEN
Bilirubin Urine: NEGATIVE
Glucose, UA: NEGATIVE mg/dL
Hgb urine dipstick: NEGATIVE
Ketones, ur: NEGATIVE mg/dL
Leukocytes,Ua: NEGATIVE
Nitrite: NEGATIVE
Protein, ur: NEGATIVE mg/dL
Specific Gravity, Urine: 1.038 — ABNORMAL HIGH (ref 1.005–1.030)
pH: 6 (ref 5.0–8.0)

## 2018-08-03 LAB — CBC
HCT: 47.4 % (ref 39.0–52.0)
Hemoglobin: 16.1 g/dL (ref 13.0–17.0)
MCH: 29.9 pg (ref 26.0–34.0)
MCHC: 34 g/dL (ref 30.0–36.0)
MCV: 87.9 fL (ref 80.0–100.0)
Platelets: 44 10*3/uL — ABNORMAL LOW (ref 150–400)
RBC: 5.39 MIL/uL (ref 4.22–5.81)
RDW: 14.4 % (ref 11.5–15.5)
WBC: 7.1 10*3/uL (ref 4.0–10.5)
nRBC: 0.3 % — ABNORMAL HIGH (ref 0.0–0.2)

## 2018-08-03 LAB — COMPREHENSIVE METABOLIC PANEL
ALT: 82 U/L — ABNORMAL HIGH (ref 0–44)
AST: 173 U/L — ABNORMAL HIGH (ref 15–41)
Albumin: 3.4 g/dL — ABNORMAL LOW (ref 3.5–5.0)
Alkaline Phosphatase: 479 U/L — ABNORMAL HIGH (ref 38–126)
Anion gap: 13 (ref 5–15)
BUN: 21 mg/dL — ABNORMAL HIGH (ref 6–20)
CO2: 27 mmol/L (ref 22–32)
Calcium: 7.8 mg/dL — ABNORMAL LOW (ref 8.9–10.3)
Chloride: 93 mmol/L — ABNORMAL LOW (ref 98–111)
Creatinine, Ser: 0.81 mg/dL (ref 0.61–1.24)
GFR calc Af Amer: >60 mL/min (ref >60)
GFR calc non Af Amer: >60 mL/min (ref >60)
Glucose, Bld: 89 mg/dL (ref 70–99)
Potassium: 4.1 mmol/L (ref 3.5–5.1)
Sodium: 133 mmol/L — ABNORMAL LOW (ref 135–145)
Total Bilirubin: 1.6 mg/dL — ABNORMAL HIGH (ref 0.3–1.2)
Total Protein: 5.8 g/dL — ABNORMAL LOW (ref 6.5–8.1)

## 2018-08-03 LAB — SARS CORONAVIRUS 2 BY RT PCR (HOSPITAL ORDER, PERFORMED IN ~~LOC~~ HOSPITAL LAB): SARS Coronavirus 2: NEGATIVE

## 2018-08-03 LAB — URINE DRUG SCREEN, QUALITATIVE (ARMC ONLY)
Amphetamines, Ur Screen: NOT DETECTED
Barbiturates, Ur Screen: NOT DETECTED
Benzodiazepine, Ur Scrn: NOT DETECTED
Cannabinoid 50 Ng, Ur ~~LOC~~: POSITIVE — AB
Cocaine Metabolite,Ur ~~LOC~~: NOT DETECTED
MDMA (Ecstasy)Ur Screen: NOT DETECTED
Methadone Scn, Ur: NOT DETECTED
Opiate, Ur Screen: POSITIVE — AB
Phencyclidine (PCP) Ur S: NOT DETECTED
Tricyclic, Ur Screen: NOT DETECTED

## 2018-08-03 LAB — PROTIME-INR
INR: 1.7 — ABNORMAL HIGH (ref 0.8–1.2)
Prothrombin Time: 20 seconds — ABNORMAL HIGH (ref 11.4–15.2)

## 2018-08-03 LAB — LIPASE, BLOOD: Lipase: 23 U/L (ref 11–51)

## 2018-08-03 LAB — APTT: aPTT: 40 seconds — ABNORMAL HIGH (ref 24–36)

## 2018-08-03 LAB — CK: Total CK: 79 U/L (ref 49–397)

## 2018-08-03 MED ORDER — SODIUM CHLORIDE 0.9 % IV BOLUS
1000.0000 mL | Freq: Once | INTRAVENOUS | Status: AC
  Administered 2018-08-03: 1000 mL via INTRAVENOUS

## 2018-08-03 MED ORDER — ONDANSETRON 4 MG PO TBDP
4.0000 mg | ORAL_TABLET | Freq: Once | ORAL | Status: AC | PRN
  Administered 2018-08-03: 4 mg via ORAL
  Filled 2018-08-03: qty 1

## 2018-08-03 MED ORDER — IPRATROPIUM-ALBUTEROL 0.5-2.5 (3) MG/3ML IN SOLN
3.0000 mL | Freq: Once | RESPIRATORY_TRACT | Status: AC
  Administered 2018-08-03: 3 mL via RESPIRATORY_TRACT
  Filled 2018-08-03: qty 3

## 2018-08-03 MED ORDER — MORPHINE SULFATE (PF) 4 MG/ML IV SOLN
4.0000 mg | Freq: Once | INTRAVENOUS | Status: AC
  Administered 2018-08-03: 4 mg via INTRAVENOUS
  Filled 2018-08-03: qty 1

## 2018-08-03 MED ORDER — MORPHINE SULFATE (PF) 2 MG/ML IV SOLN
2.0000 mg | Freq: Once | INTRAVENOUS | Status: AC
  Administered 2018-08-03: 2 mg via INTRAVENOUS
  Filled 2018-08-03: qty 1

## 2018-08-03 MED ORDER — PROMETHAZINE HCL 25 MG/ML IJ SOLN
25.0000 mg | Freq: Once | INTRAMUSCULAR | Status: AC
  Administered 2018-08-03: 25 mg via INTRAVENOUS
  Filled 2018-08-03: qty 1

## 2018-08-03 MED ORDER — IOHEXOL 300 MG/ML  SOLN
100.0000 mL | Freq: Once | INTRAMUSCULAR | Status: AC | PRN
  Administered 2018-08-03: 100 mL via INTRAVENOUS

## 2018-08-03 MED ORDER — ONDANSETRON HCL 4 MG/2ML IJ SOLN
4.0000 mg | Freq: Once | INTRAMUSCULAR | Status: AC
  Administered 2018-08-03: 4 mg via INTRAVENOUS
  Filled 2018-08-03: qty 2

## 2018-08-03 NOTE — ED Notes (Signed)
Pt otf for imaging 

## 2018-08-03 NOTE — ED Notes (Signed)
Report given to Ann, RN.

## 2018-08-03 NOTE — ED Notes (Signed)
Pt lying in bed speaking with this RN in NAD, pt reports this has happened many times in the past and he has a hx of cyclic vomiting syndrome. Reports he has not urinated in 3 days.

## 2018-08-03 NOTE — ED Triage Notes (Signed)
Pt arrived via POV with reports of 3 days of vomiting. Pt c/o feeling dehydrated as well as abdominal pain.   Pt states he has vomited x 20 today and states he has been dry heaving.

## 2018-08-03 NOTE — ED Notes (Signed)
Pt reports pain in the upper rib area, he is concerned that it may be swollen, no injury reported

## 2018-08-03 NOTE — ED Notes (Signed)
Pt's mother updated on pt's status, pt verbalized that approval for this

## 2018-08-03 NOTE — ED Provider Notes (Signed)
[facility] Emergency Department Provider Note   ____________________________________________   First MD Initiated Contact with Patient 08/03/18 1813     (approximate)  I have reviewed the triage vital signs and the nursing notes.   HISTORY  Chief Complaint Emesis    HPI Dakota Bowen is a 38 y.o. male with a history of cyclic vomiting syndrome who has been having nausea and vomiting for 3 days.  He has been unable to keep anything down has not drank since yesterday.  No food since Friday.  He has not vomiting currently after having Zofran.  Urine output since Friday.  He was wheezing a lot but after 1 neb is now almost clear.  He has left upper quadrant abdominal pain.  And his lab work which is usually not bad looking shows some signs of hepatitis or obstruction.     Past Medical History:  Diagnosis Date  . Cyclical vomiting syndrome   . GERD (gastroesophageal reflux disease)   . GERD (gastroesophageal reflux disease)     Patient Active Problem List   Diagnosis Date Noted  . ARF (acute renal failure) ([place]) 10/28/2017  . Cyclic vomiting syndrome 01/24/2017  . Benzodiazepine overdose 09/24/2016  . 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 Intractable vomiting 02/24/2016  . Acute gastritis 05/19/2015  . Leukocytosis 05/19/2015  . Hyperglycemia 05/19/2015  . LLQ pain 05/19/2015  . Constipation 05/19/2015  . Nausea & vomiting 05/16/2015  . Malnutrition of moderate degree ([place]) 08/11/2014  . Hematemesis with nausea   . Hematemesis 08/09/2014  . Duodenitis 06/22/2014  . Gastritis 06/17/2014  . Vomiting 06/15/2014    Past Surgical History:  Procedure Laterality Date  . CHOLECYSTECTOMY    . ESOPHAGOGASTRODUODENOSCOPY (EGD) WITH PROPOFOL N/A 06/21/2014   Procedure: ESOPHAGOGASTRODUODENOSCOPY (EGD) WITH PROPOFOL. Looking in the esophagus stomach and upper small intestine with a lighted tube to examine and  treat.;  Surgeon: Lollie Sails, MD;  Location: [facility];  Service: Endoscopy;  Laterality: N/A;    Prior to Admission medications   Medication Sig Start Date End Date Taking? Authorizing Provider  promethazine (PHENERGAN) 25 MG tablet Take 0.5 tablets (12.5 mg total) by mouth every 6 (six) hours as needed for nausea or vomiting. Patient not taking: Reported on 10/28/2017 01/26/17   Henreitta Leber, MD    Allergies Penicillins  Family History  Problem Relation Age of Onset  . Diabetes Mother   . Cancer Mother   . Diabetes Father     Social History Social History   Tobacco Use  . Smoking status: Current Every Day Smoker    Packs/day: 1.50  . Smokeless tobacco: Never Used  Substance Use Topics  . Alcohol use: Yes    Comment: 3-4beers/day  . Drug use: Yes    Types: Marijuana, Cocaine, Heroin    Review of Systems  Constitutional: No fever/chills Eyes: No visual changes. ENT: No sore throat. Cardiovascular: Denies chest pain. Respiratory: Denies shortness of breath. Gastrointestinal:abdominal pain.   nausea,vomiting.  No diarrhea.  No constipation. Genitourinary: Negative for dysuria. Musculoskeletal: Negative for back pain. Skin: Negative for rash. Neurological: Negative for headaches, focal weakness  ____________________________________________   PHYSICAL EXAM:  VITAL SIGNS: ED Triage Vitals  Enc Vitals Group     BP 08/03/18 1638 119/89     Pulse Rate 08/03/18 1638 79     Resp 08/03/18 1638 18     Temp 08/03/18 1638 98.8 F (37.1 C)  Temp Source 08/03/18 1638 Oral     SpO2 08/03/18 1638 97 %     Weight 08/03/18 1650 150 lb (68 kg)     Height 08/03/18 1650 6' (1.829 m)     Head Circumference --      Peak Flow --      Pain Score 08/03/18 1650 8     Pain Loc --      Pain Edu? --      Excl. in [place]? --    Constitutional: Alert and oriented.  Looks ill Eyes: Conjunctivae are normal.  Head: Atraumatic. Nose: No congestion/rhinnorhea.  Mouth/Throat: Mucous membranes are moist.  Oropharynx non-erythematous. Neck: No stridor.  Cardiovascular: Normal rate, regular rhythm. Grossly normal heart sounds.  Good peripheral circulation. Respiratory: Normal respiratory effort.  No retractions. Lungs CTAB. Gastrointestinal: Soft quite tender to palpation especially in the left upper quadrant.. No distention. No abdominal bruits. No CVA tenderness. Musculoskeletal: No lower extremity tenderness nor edema.  Neurologic:  Normal speech and language. No gross focal neurologic deficits are appreciated.  Skin:  Skin is warm, dry and intact. No rash noted.   ____________________________________________   LABS (all labs ordered are listed, but only abnormal results are displayed)  Labs Reviewed  COMPREHENSIVE METABOLIC PANEL - Abnormal; Notable for the following components:      Result Value   Sodium 133 (*)    Chloride 93 (*)    BUN 21 (*)    Calcium 7.8 (*)    Total Protein 5.8 (*)    Albumin 3.4 (*)    AST 173 (*)    ALT 82 (*)    Alkaline Phosphatase 479 (*)    Total Bilirubin 1.6 (*)    All other components within normal limits  CBC - Abnormal; Notable for the following components:   Platelets 44 (*)    nRBC 0.3 (*)    All other components within normal limits  URINALYSIS, COMPLETE (UACMP) WITH MICROSCOPIC - Abnormal; Notable for the following components:   Color, Urine YELLOW (*)    APPearance CLEAR (*)    Specific Gravity, Urine 1.038 (*)    All other components within normal limits  URINE DRUG SCREEN, QUALITATIVE ([facility] ONLY) - Abnormal; Notable for the following components:   Opiate, Ur Screen POSITIVE (*)    Cannabinoid 50 Ng, Ur [place] POSITIVE (*)    All other components within normal limits  PROTIME-INR - Abnormal; Notable for the following components:   Prothrombin Time 20.0 (*)    INR 1.7 (*)    All other components within normal limits  APTT - Abnormal; Notable for the following components:   aPTT 40 (*)     All other components within normal limits  SARS CORONAVIRUS 2 (HOSPITAL ORDER, [place] [facility])  CULTURE, BLOOD (ROUTINE X 2)  CULTURE, BLOOD (ROUTINE X 2)  LIPASE, BLOOD  CK  EPSTEIN-BARR VIRUS (EBV) ANTIBODY PROFILE  RSV(RESPIRATORY SYNCYTIAL VIRUS) AB, BLOOD  CMV IGM  LACTATE DEHYDROGENASE  PATHOLOGIST SMEAR REVIEW  JAK2 GENOTYPR  BETA-2-GLYCOPROTEIN I ABS, IGG/M/A  CARDIOLIPIN ANTIBODIES, IGG, IGM, IGA  HEXAGONAL PHASE PHOSPHOLIPID  LUPUS ANTICOAGULANT PANEL  BCR-ABL1 FISH  ANA COMPREHENSIVE PANEL  PROTEIN C ACTIVITY  PROTEIN C, TOTAL  PROTEIN S ACTIVITY  PROTEIN S, TOTAL   ____________________________________________  EKG   ____________________________________________  RADIOLOGY  ED MD interpretation: Rest x-ray read by radiology reviewed by me shows what the radiologist reads is stable chronic bronchitis changes  Official radiology report(s): Dg Chest  2 View  Result Date: 08/03/2018 CLINICAL DATA:  Two-day history of wheezing, nausea and vomiting. Current smoker. EXAM: CHEST - 2 VIEW COMPARISON:  01/24/2017 and earlier. FINDINGS: Cardiomediastinal silhouette unremarkable and unchanged. Mildly prominent bronchovascular markings and mild central peribronchial thickening, unchanged over multiple prior examinations. Lungs otherwise clear. No localized airspace consolidation. No pleural effusions. No pneumothorax. Normal pulmonary vascularity. Visualized bony thorax intact. IMPRESSION: Stable mild changes of chronic bronchitis and/or asthma. No acute cardiopulmonary disease. Electronically Signed   By: Evangeline Dakin M.D.   On: 08/03/2018 18:52   Ct Abdomen Pelvis W Contrast  Result Date: 08/03/2018 CLINICAL DATA:  38 year old male with abdominal pain and vomiting. EXAM: CT ABDOMEN AND PELVIS WITH CONTRAST TECHNIQUE: Multidetector CT imaging of the abdomen and pelvis was performed using the standard protocol following bolus administration of  intravenous contrast. CONTRAST:  135mL OMNIPAQUE IOHEXOL 300 MG/ML  SOLN COMPARISON:  CT of the abdomen pelvis dated 09/13/2016 FINDINGS: Lower chest: The visualized lung bases are clear. No intra-abdominal free air. There is a small free fluid within the pelvis. Hepatobiliary: The liver is enlarged measuring approximately 20 cm in midclavicular length. There is diffuse fatty infiltration of the liver. No intrahepatic biliary ductal dilatation. Cholecystectomy. No retained calcified stone identified in the central CBD. Pancreas: Unremarkable. No pancreatic ductal dilatation or surrounding inflammatory changes. Spleen: The spleen is enlarged measuring approximately 20 cm in craniocaudal length and 22 cm in AP length. 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 Adrenals/Urinary Tract: The adrenal glands are unremarkable. There is no hydronephrosis on either side. The visualized ureters and urinary bladder appear unremarkable. Stomach/Bowel: There is no bowel obstruction or active inflammation. The appendix is normal. Vascular/Lymphatic: The abdominal aorta and IVC are unremarkable. No portal venous gas. There is no adenopathy. Reproductive: The prostate and seminal vesicles are grossly unremarkable. No pelvic mass. Other: There is paucity of subcutaneous fat. There is diffuse subcutaneous edema. Musculoskeletal: No acute or significant osseous findings. IMPRESSION: 1. Hepatosplenomegaly with diffuse fatty infiltration of the liver. Significant enlargement of the spleen, new since the prior CT with multiple areas of splenic infarct. 2. No bowel obstruction or active inflammation. Normal appendix. 3. Small free fluid within the pelvis and mild diffuse subcutaneous edema. Electronically Signed   By: Anner Crete M.D.   On: 08/03/2018 20:12    ____________________________________________   PROCEDURES  Procedure(s) performed (including Critical  Care): [place] care time half an hour.  This includes discussing the patient in depth with the hospitalist and with Dr. Janese Banks medical hematology oncology Dr.  Procedures   ____________________________________________   INITIAL IMPRESSION / ASSESSMENT AND PLAN / ED COURSE     DAYLEN LIPSKY was evaluated in Emergency Department on 08/03/2018 for the symptoms described in the history of present illness. He was evaluated in the context of the global COVID-19 pandemic, which necessitated consideration that the patient might be at risk for infection with the SARS-CoV-2 virus that causes COVID-19. Institutional protocols and algorithms that pertain to the evaluation of patients at risk for COVID-19 are in a state of rapid change based on information released by regulatory bodies including the CDC and federal and state organizations. These policies and algorithms were followed during the patient's care in the ED. Patient continues to have nausea and upper abdominal pain.  There are splenic infarction seen on the CT.  Hematology oncology feels we should admit this patient I have no problem agree with this especially  in view of his also elevated liver functions.         ____________________________________________   FINAL CLINICAL IMPRESSION(S) / ED DIAGNOSES  Final diagnoses:  Intractable vomiting with nausea, unspecified vomiting type  Dehydration  Pain of upper abdomen  Splenic infarction     ED Discharge Orders    None       Note:  This document was prepared using Dragon voice recognition software and may include unintentional dictation errors.    Nena Polio, MD 08/03/18 [phone]

## 2018-08-04 ENCOUNTER — Inpatient Hospital Stay: Payer: Medicaid Other

## 2018-08-04 DIAGNOSIS — F17210 Nicotine dependence, cigarettes, uncomplicated: Secondary | ICD-10-CM | POA: Diagnosis present

## 2018-08-04 DIAGNOSIS — K219 Gastro-esophageal reflux disease without esophagitis: Secondary | ICD-10-CM | POA: Diagnosis present

## 2018-08-04 DIAGNOSIS — R112 Nausea with vomiting, unspecified: Secondary | ICD-10-CM

## 2018-08-04 DIAGNOSIS — R162 Hepatomegaly with splenomegaly, not elsewhere classified: Secondary | ICD-10-CM | POA: Diagnosis present

## 2018-08-04 DIAGNOSIS — R109 Unspecified abdominal pain: Secondary | ICD-10-CM | POA: Diagnosis present

## 2018-08-04 DIAGNOSIS — K7200 Acute and subacute hepatic failure without coma: Secondary | ICD-10-CM | POA: Diagnosis present

## 2018-08-04 DIAGNOSIS — Z20828 Contact with and (suspected) exposure to other viral communicable diseases: Secondary | ICD-10-CM | POA: Diagnosis present

## 2018-08-04 DIAGNOSIS — Z880 Allergy status to penicillin: Secondary | ICD-10-CM | POA: Diagnosis not present

## 2018-08-04 DIAGNOSIS — Z23 Encounter for immunization: Secondary | ICD-10-CM | POA: Diagnosis not present

## 2018-08-04 DIAGNOSIS — D735 Infarction of spleen: Secondary | ICD-10-CM

## 2018-08-04 DIAGNOSIS — E860 Dehydration: Secondary | ICD-10-CM | POA: Diagnosis present

## 2018-08-04 DIAGNOSIS — R188 Other ascites: Secondary | ICD-10-CM | POA: Diagnosis present

## 2018-08-04 DIAGNOSIS — I253 Aneurysm of heart: Secondary | ICD-10-CM | POA: Diagnosis present

## 2018-08-04 DIAGNOSIS — R945 Abnormal results of liver function studies: Secondary | ICD-10-CM

## 2018-08-04 DIAGNOSIS — K760 Fatty (change of) liver, not elsewhere classified: Secondary | ICD-10-CM | POA: Diagnosis present

## 2018-08-04 DIAGNOSIS — D696 Thrombocytopenia, unspecified: Secondary | ICD-10-CM

## 2018-08-04 DIAGNOSIS — C9500 Acute leukemia of unspecified cell type not having achieved remission: Secondary | ICD-10-CM | POA: Diagnosis present

## 2018-08-04 DIAGNOSIS — D6959 Other secondary thrombocytopenia: Secondary | ICD-10-CM | POA: Diagnosis present

## 2018-08-04 LAB — CBC
HCT: 48.9 % (ref 39.0–52.0)
Hemoglobin: 16.2 g/dL (ref 13.0–17.0)
MCH: 30.3 pg (ref 26.0–34.0)
MCHC: 33.1 g/dL (ref 30.0–36.0)
MCV: 91.6 fL (ref 80.0–100.0)
Platelets: 39 10*3/uL — ABNORMAL LOW (ref 150–400)
RBC: 5.34 MIL/uL (ref 4.22–5.81)
RDW: 14.6 % (ref 11.5–15.5)
WBC: 6.6 10*3/uL (ref 4.0–10.5)
nRBC: 0.3 % — ABNORMAL HIGH (ref 0.0–0.2)

## 2018-08-04 LAB — BASIC METABOLIC PANEL
Anion gap: 10 (ref 5–15)
BUN: 18 mg/dL (ref 6–20)
CO2: 28 mmol/L (ref 22–32)
Calcium: 7.2 mg/dL — ABNORMAL LOW (ref 8.9–10.3)
Chloride: 100 mmol/L (ref 98–111)
Creatinine, Ser: 0.82 mg/dL (ref 0.61–1.24)
GFR calc Af Amer: >60 mL/min (ref >60)
GFR calc non Af Amer: >60 mL/min (ref >60)
Glucose, Bld: 86 mg/dL (ref 70–99)
Potassium: 3.5 mmol/L (ref 3.5–5.1)
Sodium: 138 mmol/L (ref 135–145)

## 2018-08-04 LAB — PROTIME-INR
INR: 1.7 — ABNORMAL HIGH (ref 0.8–1.2)
Prothrombin Time: 19.6 seconds — ABNORMAL HIGH (ref 11.4–15.2)

## 2018-08-04 LAB — TSH: TSH: 6.771 u[IU]/mL — ABNORMAL HIGH (ref 0.350–4.500)

## 2018-08-04 LAB — HEPARIN LEVEL (UNFRACTIONATED): Heparin Unfractionated: <0.1 IU/mL — ABNORMAL LOW (ref 0.30–0.70)

## 2018-08-04 LAB — ACETAMINOPHEN LEVEL: Acetaminophen (Tylenol), Serum: <10 ug/mL — ABNORMAL LOW (ref 10–30)

## 2018-08-04 LAB — LACTATE DEHYDROGENASE: LDH: 1117 U/L — ABNORMAL HIGH (ref 98–192)

## 2018-08-04 LAB — URIC ACID: Uric Acid, Serum: 5.8 mg/dL (ref 3.7–8.6)

## 2018-08-04 LAB — PATHOLOGIST SMEAR REVIEW

## 2018-08-04 LAB — FIBRINOGEN: Fibrinogen: 117 mg/dL — ABNORMAL LOW (ref 210–475)

## 2018-08-04 MED ORDER — IOHEXOL 300 MG/ML  SOLN: 100.0000 mL | Freq: Once | INTRAMUSCULAR | Status: DC | PRN

## 2018-08-04 MED ORDER — SODIUM CHLORIDE 0.9 % IV SOLN
INTRAVENOUS | Status: DC
  Administered 2018-08-04 – 2018-08-05 (×5): via INTRAVENOUS

## 2018-08-04 MED ORDER — IOHEXOL 300 MG/ML  SOLN
75.0000 mL | Freq: Once | INTRAMUSCULAR | Status: AC | PRN
  Administered 2018-08-04: 75 mL via INTRAVENOUS

## 2018-08-04 MED ORDER — POLYETHYLENE GLYCOL 3350 17 G PO PACK: 17.0000 g | PACK | Freq: Every day | ORAL | Status: DC | PRN

## 2018-08-04 MED ORDER — VITAMIN K1 10 MG/ML IJ SOLN
10.0000 mg | Freq: Every day | INTRAMUSCULAR | Status: AC
  Administered 2018-08-04 – 2018-08-06 (×3): 10 mg via SUBCUTANEOUS
  Filled 2018-08-04 (×3): qty 1

## 2018-08-04 MED ORDER — HYDROCODONE-ACETAMINOPHEN 5-325 MG PO TABS
1.0000 | ORAL_TABLET | ORAL | Status: DC | PRN
  Administered 2018-08-04 – 2018-08-05 (×6): 1 via ORAL
  Filled 2018-08-04 (×6): qty 1

## 2018-08-04 MED ORDER — MORPHINE SULFATE (PF) 2 MG/ML IV SOLN
2.0000 mg | INTRAVENOUS | Status: DC | PRN
  Administered 2018-08-04 – 2018-08-08 (×20): 2 mg via INTRAVENOUS
  Filled 2018-08-04 (×23): qty 1

## 2018-08-04 MED ORDER — ACETAMINOPHEN 650 MG RE SUPP: 650.0000 mg | Freq: Four times a day (QID) | RECTAL | Status: DC | PRN

## 2018-08-04 MED ORDER — BOOST / RESOURCE BREEZE PO LIQD CUSTOM
1.0000 | Freq: Three times a day (TID) | ORAL | Status: DC
  Administered 2018-08-04 – 2018-08-06 (×2): 1 via ORAL

## 2018-08-04 MED ORDER — ACETAMINOPHEN 325 MG PO TABS: 650.0000 mg | ORAL_TABLET | Freq: Four times a day (QID) | ORAL | Status: DC | PRN

## 2018-08-04 MED ORDER — HEPARIN BOLUS VIA INFUSION
2000.0000 [IU] | Freq: Once | INTRAVENOUS | Status: AC
  Administered 2018-08-04: 2000 [IU] via INTRAVENOUS
  Filled 2018-08-04: qty 2000

## 2018-08-04 MED ORDER — ONDANSETRON HCL 4 MG/2ML IJ SOLN
4.0000 mg | INTRAMUSCULAR | Status: DC | PRN
  Administered 2018-08-04 – 2018-08-07 (×5): 4 mg via INTRAVENOUS
  Filled 2018-08-04 (×5): qty 2

## 2018-08-04 MED ORDER — ONDANSETRON HCL 4 MG/2ML IJ SOLN
4.0000 mg | Freq: Four times a day (QID) | INTRAMUSCULAR | Status: DC | PRN
  Administered 2018-08-04 (×2): 4 mg via INTRAVENOUS
  Filled 2018-08-04 (×2): qty 2

## 2018-08-04 MED ORDER — ADULT MULTIVITAMIN W/MINERALS CH
1.0000 | ORAL_TABLET | Freq: Every day | ORAL | Status: DC
  Administered 2018-08-07 – 2018-08-08 (×2): 1 via ORAL
  Filled 2018-08-04 (×3): qty 1

## 2018-08-04 MED ORDER — NICOTINE 21 MG/24HR TD PT24
21.0000 mg | MEDICATED_PATCH | Freq: Every day | TRANSDERMAL | Status: DC
  Administered 2018-08-04 – 2018-08-07 (×4): 21 mg via TRANSDERMAL
  Filled 2018-08-04 (×4): qty 1

## 2018-08-04 MED ORDER — HEPARIN (PORCINE) 25000 UT/250ML-% IV SOLN
1100.0000 [IU]/h | INTRAVENOUS | Status: DC
  Administered 2018-08-04: 650 [IU]/h via INTRAVENOUS
  Filled 2018-08-04: qty 250

## 2018-08-04 MED ORDER — MORPHINE SULFATE (PF) 4 MG/ML IV SOLN
4.0000 mg | INTRAVENOUS | Status: AC
  Administered 2018-08-04: 4 mg via INTRAVENOUS
  Filled 2018-08-04: qty 1

## 2018-08-04 MED ORDER — PANTOPRAZOLE SODIUM 40 MG IV SOLR
40.0000 mg | Freq: Two times a day (BID) | INTRAVENOUS | Status: DC
  Administered 2018-08-04 – 2018-08-06 (×6): 40 mg via INTRAVENOUS
  Filled 2018-08-04 (×6): qty 40

## 2018-08-04 MED ORDER — ONDANSETRON HCL 4 MG PO TABS
4.0000 mg | ORAL_TABLET | Freq: Four times a day (QID) | ORAL | Status: DC | PRN
  Filled 2018-08-04: qty 1

## 2018-08-04 NOTE — Consult Note (Signed)
Hematology/Oncology Consult note [facility] Telephone:([phone] [phone]  Patient Care Team: Albina Billet, MD as PCP - General (Internal Medicine)   Name of the patient: Dakota Bowen  [id number]  10-14-1980    Reason for consult: Hepatosplenomegaly/splenic infarct   Requesting physician: Dr. Conni Slipper  Date of visit: 08/04/2018   History of presenting illness- Patient is a 38 year old male with a past medical history is significant for GERD and cyclical vomiting syndrome.  He presented to the ER On 08/03/2018 with symptoms of intractable nausea and vomiting as well as left upper quadrant abdominal pain.  He underwent CT abdomen and pelvis with contrast which showed hepatosplenomegaly.  Liver was enlarged at 20 cm with diffuse fatty infiltration.  Spleen was enlarged at 20 x 22 cm.  Pancreas was unremarkable.  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  Also reports that he was an IV drug user in the past but has not used any IV drugs for over a year.  Presently patient reports feeling fatigued and has left upper quadrant abdominal pain.  Denies any diarrhea but last bowel movement was over a week ago.   ECOG PS- 2  Pain scale- 6    Review of systems- Review of Systems  Constitutional: Positive for malaise/fatigue and weight loss. Negative for chills and fever.  HENT: Negative for congestion, ear discharge and nosebleeds.   Eyes: Negative for blurred vision.  Respiratory: Negative for cough, hemoptysis, sputum production, shortness of breath and wheezing.   Cardiovascular: Negative for chest pain, palpitations, orthopnea and claudication.  Gastrointestinal: Positive for abdominal pain. Negative for blood in stool, constipation, diarrhea, heartburn, melena,  nausea and vomiting.  Genitourinary: Negative for dysuria, flank pain, frequency, hematuria and urgency.  Musculoskeletal: Negative for back pain, joint pain and myalgias.  Skin: Negative for rash.  Neurological: Negative for dizziness, tingling, focal weakness, seizures, weakness and headaches.  Endo/Heme/Allergies: Does not bruise/bleed easily.  Psychiatric/Behavioral: Negative for depression and suicidal ideas. The patient does not have insomnia.     Allergies  Allergen Reactions  . Penicillins Itching, Rash and Other (See Comments)        Patient Active Problem List   Diagnosis Date Noted  . Abdominal pain 08/04/2018  . ARF (acute renal failure) ([place]) 10/28/2017  . Cyclic vomiting syndrome 01/24/2017  . Benzodiazepine overdose 09/24/2016  . 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 Intractable vomiting 02/24/2016  . Acute gastritis 05/19/2015  . Leukocytosis 05/19/2015  . Hyperglycemia 05/19/2015  . LLQ pain 05/19/2015  . Constipation 05/19/2015  . Nausea & vomiting 05/16/2015  . Malnutrition of moderate degree ([place]) 08/11/2014  . Hematemesis with nausea   . Hematemesis 08/09/2014  . Duodenitis 06/22/2014  . Gastritis 06/17/2014  . Vomiting 06/15/2014     Past Medical History:  Diagnosis Date  . Cyclical vomiting syndrome   . GERD (gastroesophageal reflux disease)   . GERD (gastroesophageal reflux disease)      Past Surgical History:  Procedure Laterality Date  . CHOLECYSTECTOMY    . ESOPHAGOGASTRODUODENOSCOPY (EGD) WITH PROPOFOL N/A 06/21/2014   Procedure: ESOPHAGOGASTRODUODENOSCOPY (EGD) WITH PROPOFOL. Looking in the esophagus stomach and upper small intestine with a lighted tube to examine and treat.;  Surgeon: Lollie Sails, MD;  Location:  [facility];  Service: Endoscopy;  Laterality: N/A;    Social History   Socioeconomic History  . Marital status: Single    Spouse name: Not on file  . Number of children:  Not on file  . Years of education: Not on file  . Highest education level: Not on file  Occupational History  . Not on file  Social Needs  . Financial resource strain: Not on file  . Food insecurity    Worry: Not on file    Inability: Not on file  . Transportation needs    Medical: Not on file    Non-medical: Not on file  Tobacco Use  . Smoking status: Current Every Day Smoker    Packs/day: 1.50  . Smokeless tobacco: Never Used  Substance and Sexual Activity  . Alcohol use: Yes    Comment: 3-4beers/day  . Drug use: Yes    Types: Marijuana, Cocaine, Heroin  . Sexual activity: Not on file  Lifestyle  . Physical activity    Days per week: Not on file    Minutes per session: Not on file  . Stress: Not on file  Relationships  . Social Herbalist on phone: Not on file    Gets together: Not on file    Attends religious service: Not on file    Active member of club or organization: Not on file    Attends meetings of clubs or organizations: Not on file    Relationship status: Not on file  . Intimate partner violence    Fear of current or ex partner: Not on file    Emotionally abused: Not on file    Physically abused: Not on file    Forced sexual activity: Not on file  Other Topics Concern  . Not on file  Social History Narrative  . Not on file     Family History  Problem Relation Age of Onset  . Diabetes Mother   . Cancer Mother   . Diabetes Father      Current Facility-Administered Medications:  .  0.9 %  sodium chloride infusion, , Intravenous, Continuous, Seals, Theo Dills, NP, Last Rate: 125 mL/hr at 08/04/18 1006 .  acetaminophen (TYLENOL) tablet 650 mg, 650 mg, Oral, Q6H PRN **OR** acetaminophen (TYLENOL) suppository 650 mg, 650 mg, Rectal, Q6H PRN, Seals, Angela H, NP .  HYDROcodone-acetaminophen (NORCO/VICODIN) 5-325 MG per tablet 1 tablet, 1 tablet, Oral, Q4H PRN, Fritzi Mandes, MD, 1 tablet at 08/04/18 1005 .  morphine 2 MG/ML injection 2 mg, 2 mg,  Intravenous, Q3H PRN, Fritzi Mandes, MD, 2 mg at 08/04/18 0831 .  [DISCONTINUED] ondansetron (ZOFRAN) tablet 4 mg, 4 mg, Oral, Q6H PRN **OR** ondansetron (ZOFRAN) injection 4 mg, 4 mg, Intravenous, Q4H PRN, Fritzi Mandes, MD .  pantoprazole (PROTONIX) injection 40 mg, 40 mg, Intravenous, Q12H, Seals, Angela H, NP, 40 mg at 08/04/18 0537 .  phytonadione (VITAMIN K) SQ injection 10 mg, 10 mg, Subcutaneous, Daily, Jonathon Bellows, MD .  polyethylene glycol (MIRALAX / GLYCOLAX) packet 17 g, 17 g, Oral, Daily PRN, Mayer Camel, NP   Physical exam:  Vitals:   08/04/18 0100 08/04/18 0130 08/04/18 0215 08/04/18 0503  BP: 112/77 106/76 104/79 108/85  Pulse: 64 65 69 82  Resp:   18 18  Temp:   (!) 97.5 F (36.4 C) 98 F (36.7 C)  TempSrc:   Oral Oral  SpO2: (!) 89%  98% 98%  Weight:      Height:  Physical Exam Constitutional:      Comments: Patient appears thin and fatigued  HENT:     Head: Normocephalic and atraumatic.  Eyes:     Pupils: Pupils are equal, round, and reactive to light.  Neck:     Musculoskeletal: Normal range of motion.  Cardiovascular:     Rate and Rhythm: Normal rate and regular rhythm.     Heart sounds: Normal heart sounds.  Pulmonary:     Effort: Pulmonary effort is normal.     Breath sounds: Normal breath sounds.  Abdominal:     General: Bowel sounds are normal.     Palpations: Abdomen is soft.     Comments: There is palpable hepatosplenomegaly.  Guarding positive.  Palpable right inguinal lymph node roughly 1 cm in size  Lymphadenopathy:     Comments: Palpable right axillary lymph node roughly 2 cm in size.  No palpable cervical or supraclavicular adenopathy  Skin:    General: Skin is warm and dry.  Neurological:     Mental Status: He is alert and oriented to person, place, and time.        CMP Latest Ref Rng & Units 08/04/2018  Glucose 70 - 99 mg/dL 86  BUN 6 - 20 mg/dL 18  Creatinine 0.61 - 1.24 mg/dL 0.82  Sodium 135 - 145 mmol/L 138  Potassium  3.5 - 5.1 mmol/L 3.5  Chloride 98 - 111 mmol/L 100  CO2 22 - 32 mmol/L 28  Calcium 8.9 - 10.3 mg/dL 7.2(L)  Total Protein 6.5 - 8.1 g/dL -  Total Bilirubin 0.3 - 1.2 mg/dL -  Alkaline Phos 38 - 126 U/L -  AST 15 - 41 U/L -  ALT 0 - 44 U/L -   CBC Latest Ref Rng & Units 08/04/2018  WBC 4.0 - 10.5 K/uL 6.6  Hemoglobin 13.0 - 17.0 g/dL 16.2  Hematocrit 39.0 - 52.0 % 48.9  Platelets 150 - 400 K/uL 39(L)    @IMAGES @  Dg Chest 2 View  Result Date: 08/03/2018 CLINICAL DATA:  Two-day history of wheezing, nausea and vomiting. Current smoker. EXAM: CHEST - 2 VIEW COMPARISON:  01/24/2017 and earlier. FINDINGS: Cardiomediastinal silhouette unremarkable and unchanged. Mildly prominent bronchovascular markings and mild central peribronchial thickening, unchanged over multiple prior examinations. Lungs otherwise clear. No localized airspace consolidation. No pleural effusions. No pneumothorax. Normal pulmonary vascularity. Visualized bony thorax intact. IMPRESSION: Stable mild changes of chronic bronchitis and/or asthma. No acute cardiopulmonary disease. Electronically Signed   By: Evangeline Dakin M.D.   On: 08/03/2018 18:52   Ct Abdomen Pelvis W Contrast  Result Date: 08/03/2018 CLINICAL DATA:  38 year old male with abdominal pain and vomiting. EXAM: CT ABDOMEN AND PELVIS WITH CONTRAST TECHNIQUE: Multidetector CT imaging of the abdomen and pelvis was performed using the standard protocol following bolus administration of intravenous contrast. CONTRAST:  121mL OMNIPAQUE IOHEXOL 300 MG/ML  SOLN COMPARISON:  CT of the abdomen pelvis dated 09/13/2016 FINDINGS: Lower chest: The visualized lung bases are clear. No intra-abdominal free air. There is a small free fluid within the pelvis. Hepatobiliary: The liver is enlarged measuring approximately 20 cm in midclavicular length. There is diffuse fatty infiltration of the liver. No intrahepatic biliary ductal dilatation. Cholecystectomy. No retained calcified stone  identified in the central CBD. Pancreas: Unremarkable. No pancreatic ductal dilatation or surrounding inflammatory changes. Spleen: The spleen is enlarged measuring approximately 20 cm in craniocaudal length and 22 cm in AP length. There are which shaped areas of hypoenhancement throughout the spleen most consistent  with areas of infarct. Adrenals/Urinary Tract: The adrenal glands are unremarkable. There is no hydronephrosis on either side. The visualized ureters and urinary bladder appear unremarkable. Stomach/Bowel: There is no bowel obstruction or active inflammation. The appendix is normal. Vascular/Lymphatic: The abdominal aorta and IVC are unremarkable. No portal venous gas. There is no adenopathy. Reproductive: The prostate and seminal vesicles are grossly unremarkable. No pelvic mass. Other: There is paucity of subcutaneous fat. There is diffuse subcutaneous edema. Musculoskeletal: No acute or significant osseous findings. IMPRESSION: 1. Hepatosplenomegaly with diffuse fatty infiltration of the liver. Significant enlargement of the spleen, new since the prior CT with multiple areas of splenic infarct. 2. No bowel obstruction or active inflammation. Normal appendix. 3. Small free fluid within the pelvis and mild diffuse subcutaneous edema. Electronically Signed   By: Anner Crete M.D.   On: 08/03/2018 20:12    Assessment and plan- Patient is a 38 y.o. male with history of alcohol dependence, IV drug use in the past and cyclical vomiting syndrome admitted for nausea vomiting and abdominal pain.  Found to have significant thrombocytopenia and splenic infarct and hepatosplenomegaly on CT abdomen  1.  Splenic infarct: Splenic infarct can be seen in multiple etiologies both infectious and noninfectious.  Given his age and use of IV drugs in the past-I would recommend getting TEE to rule out infective endocarditis.  Blood cultures have been negative so far and patient is afebrile.  Infectious causes of  splenic infarct including EBV, CMV, parvovirus B19 as well as other rare conditions such as malaria babesiosis.  Splenic infarct can also be seen and noninfectious hematological condition such as myeloproliferative neoplasm or MDS.  Patient has a normal white count and has significant thrombocytopenia.  Myeloproliferative neoplasm is unlikely but I have ordered a Jak 2 mutation as well as BCR ABL FISH testing.  Given that he is Caucasian with no evidence of microcytosis noted I do not think that we are dealing with hemoglobinopathy.  Lymphoma is another possibility that can cause splenic infarct.  He does have palpable right axillary adenopathy as well as right inguinal adenopathy and I would recommend getting a CT chest with contrast to evaluate this further.  We could potentially consider getting a core biopsy or further diagnosis.  Hypercoagulable condition such as protein C deficiency, protein S deficiency antiphospholipid antibody syndrome can also cause splenic infarcts and these tests have also been ordered.  Management of splenic infarct mainly involves IV hydration as well as pain control.  Anticoagulation is mainly indicated if the splenic infarct is thromboembolic in origin and currently TEE is pending.  Given his significant thrombocytopenia I would recommend starting him on heparin drip but at half the dose instead of full dose anticoagulation and monitor his platelets daily.    GI has also been on board for his hepatosplenomegaly and further work-up including HIV, hepatitis serologies, HSV has also been ordered by them  Patient does have abnormal LFTs as well as elevated LDH which can be seen in acute liver injury but again can also be seen in conditions such as lymphoma.  Monitor LDH daily.  Baseline uric acid is normal.  2.  Thrombocytopenia: Likely secondary to acute liver injury.  Lymphoma remains a possibility.  I have personally reviewed peripheral smear which is also been reviewed by  pathology and there is no evidence of schistocytes.  Also patient has an elevated PT PTT INR which is not seen in TTP and I do not think that the patient has  TTP based on his clinical presentation.  Continue to monitor CBC daily     Visit Diagnosis 1. Intractable vomiting with nausea, unspecified vomiting type   2. Dehydration   3. Pain of upper abdomen   4. Splenic infarction   5. Abnormal LFTs     Dr. Randa Evens, MD, MPH [facility] [phone] 08/04/2018  1:06 PM

## 2018-08-04 NOTE — Progress Notes (Signed)
ANTICOAGULATION CONSULT NOTE - Initial Consult  Pharmacy Consult for Heparin drip Indication: splenic infarcts   Patient Measurements: Height: 6' (182.9 cm) Weight: 150 lb (68 kg) IBW/kg (Calculated) : 77.6 Heparin Dosing Weight: 68 kg  Vital Signs: Temp: 97.8 F (36.6 C) (07/20 2040) Temp Source: Oral (07/20 2040) BP: 111/83 (07/20 2040) Pulse Rate: 77 (07/20 2040)  Labs: Recent Labs    08/03/18 1655 08/03/18 1824 08/03/18 2218 08/04/18 0244 08/04/18 2050  HGB 16.1  --   --  16.2  --   HCT 47.4  --   --  48.9  --   PLT 44*  --   --  39*  --   APTT  --   --  40*  --   --   LABPROT  --   --  20.0* 19.6*  --   INR  --   --  1.7* 1.7*  --   HEPARINUNFRC  --   --   --   --  <0.10*  CREATININE 0.81  --   --  0.82  --   CKTOTAL  --  79  --   --   --     Estimated Creatinine Clearance: 115.2 mL/min (by C-G formula based on SCr of 0.82 mg/dL).   Medical History: Past Medical History:  Diagnosis Date  . Cyclical vomiting syndrome   . GERD (gastroesophageal reflux disease)   . GERD (gastroesophageal reflux disease)     Medications:  Scheduled:  . feeding supplement  1 Container Oral TID BM  . heparin  2,000 Units Intravenous Once  . [START ON 08/05/2018] multivitamin with minerals  1 tablet Oral Daily  . nicotine  21 mg Transdermal Daily  . pantoprazole (PROTONIX) IV  40 mg Intravenous Q12H  . phytonadione  10 mg Subcutaneous Daily   Infusions:  . sodium chloride 125 mL/hr at 08/04/18 2026  . heparin 650 Units/hr (08/04/18 2026)    Assessment: 38 yo male to start Heparin drip for splenic infarcts. Abnormal liver fx tests. Hx ETOH, marijuana, IV drug use. Hgb 16.2  Plt 39 INR 3.7 aptt 40  Oncology recommends starting Heparin drip at 1/2 the dose instead of full dose given his thrombocytopenia-monitor Plt  Goal of Therapy:  Heparin level 0.3-0.7 units/ml Monitor platelets by anticoagulation protocol: Yes   Plan:  Start heparin infusion at 650  units/hr Check anti-Xa level in 6 hours and daily while on heparin Continue to monitor H&H and platelets   7/20:  HL @ 2050 = < 0.1 Will order Heparin 2000 units IV X 1 and increase drip rate to 900 units/hr.  Will recheck HL 6 hrs after rate change.   Keina Mutch D 08/04/2018,9:48 PM

## 2018-08-04 NOTE — Progress Notes (Signed)
[place] [facility] [place] NAME: Dakota Bowen    MR#:  [id number]  DATE OF BIRTH:  01/03/1981  SUBJECTIVE:   Him in with significant abdominal pain left upper quadrant along with nausea vomiting. He reports not drinking alcohol. However does marijuana on a daily basis REVIEW OF SYSTEMS:   Review of Systems  Constitutional: Negative for chills, fever and weight loss.  HENT: Negative for ear discharge, ear pain and nosebleeds.   Eyes: Negative for blurred vision, pain and discharge.  Respiratory: Negative for sputum production, shortness of breath, wheezing and stridor.   Cardiovascular: Negative for chest pain, palpitations, orthopnea and PND.  Gastrointestinal: Negative for abdominal pain, diarrhea, nausea and vomiting.  Genitourinary: Negative for frequency and urgency.  Musculoskeletal: Negative for back pain and joint pain.  Neurological: Negative for sensory change, speech change, focal weakness and weakness.  Psychiatric/Behavioral: Negative for depression and hallucinations. The patient is not nervous/anxious.    Tolerating Diet:yes  DRUG ALLERGIES:     VITALS:  Blood pressure 111/75, pulse 74, temperature (!) 97.5 F (36.4 C), temperature source Oral, resp. rate 16, height 6' (1.829 m), weight 68 kg, SpO2 98 %.  PHYSICAL EXAMINATION:   Physical Exam  GENERAL:  38 y.o.-year-old patient lying in the bed with no acute distress.  EYES: Pupils equal, round, reactive to light and accommodation. No scleral icterus. Extraocular muscles intact.  HEENT: Head atraumatic, normocephalic. Oropharynx and nasopharynx clear.  NECK:  Supple, no jugular venous distention. No thyroid enlargement, no tenderness.  LUNGS: Normal breath sounds bilaterally, no wheezing, rales, rhonchi. No use of accessory muscles of respiration.  CARDIOVASCULAR: S1, S2 normal. No murmurs, rubs, or gallops.  ABDOMEN: Soft, left upper quadrant tender, distended.  Bowel sounds present. No organomegaly or mass.  EXTREMITIES: No cyanosis, clubbing or edema b/l.    NEUROLOGIC: Cranial nerves II through XII are intact. No focal Motor or sensory deficits b/l.   PSYCHIATRIC:  patient is alert and oriented x 3.  SKIN: No obvious rash, lesion, or ulcer.   LABORATORY PANEL:  CBC Recent Labs  Lab 08/04/18 0244  WBC 6.6  HGB 16.2  HCT 48.9  PLT 39*    Chemistries  Recent Labs  Lab 08/03/18 1655 08/04/18 0244  NA 133* 138  K 4.1 3.5  CL 93* 100  CO2 27 28  GLUCOSE 89 86  BUN 21* 18  CREATININE 0.81 0.82  CALCIUM 7.8* 7.2*  AST 173*  --   ALT 82*  --   ALKPHOS 479*  --   BILITOT 1.6*  --    Cardiac Enzymes No results for input(s): TROPONINI in the last 168 hours. RADIOLOGY:  Dg Chest 2 View  Result Date: 08/03/2018 CLINICAL DATA:  Two-day history of wheezing, nausea and vomiting. Current smoker. EXAM: CHEST - 2 VIEW COMPARISON:  01/24/2017 and earlier. FINDINGS: Cardiomediastinal silhouette unremarkable and unchanged. Mildly prominent bronchovascular markings and mild central peribronchial thickening, unchanged over multiple prior examinations. Lungs otherwise clear. No localized airspace consolidation. No pleural effusions. No pneumothorax. Normal pulmonary vascularity. Visualized bony thorax intact. IMPRESSION: Stable mild changes of chronic bronchitis and/or asthma. No acute cardiopulmonary disease. Electronically Signed   By: Evangeline Dakin M.D.   On: 08/03/2018 18:52   Ct Chest W Contrast  Result Date: 08/04/2018 CLINICAL DATA:  Nausea and upper abdominal pain. 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 EXAM: CT CHEST WITH CONTRAST TECHNIQUE: Multidetector CT imaging of the chest  was performed during intravenous contrast administration. CONTRAST:  10mL OMNIPAQUE IOHEXOL 300 MG/ML  SOLN COMPARISON:  CT scan of the abdomen and pelvis August 03, 2018 FINDINGS: Cardiovascular: The heart size is normal. The thoracic aorta is  nonaneurysmal with no dissection or atherosclerotic changes. Central pulmonary arteries are normal in caliber. No coronary artery calcifications are identified. Mediastinum/Nodes: Adenopathy in the axilla, right greater than left. A representative node in the right axilla on series 2, image 40 measures 2.1 cm. Retro pectoral adenopathy is seen on the right is well. No retropectoral adenopathy on the left. There is a mildly prominent epicardial node on series 2, image 120 measuring 8 mm, not definitely abnormal. A retrocrural node measures 11 mm on series 2, image 75, borderline. A few other shotty nodes are seen in the mediastinum without gross mediastinal adenopathy. No hilar adenopathy is identified. Lungs/Pleura: Central airways are normal. Paraseptal emphysematous changes in the apices, right greater than left. No pneumothorax. No suspicious nodules or masses. Dependent atelectasis is seen in the bases. There is a tiny left pleural effusion. Upper Abdomen: Splenomegaly is again identified. The patient's known splenic infarcts are not well assessed without contrast today. No other abnormalities in the upper abdomen. Musculoskeletal: No chest wall abnormality. No acute or significant osseous findings. IMPRESSION: 1. There is adenopathy in the bilateral axilla, right much greater than left. There is also retropectoral adenopathy on the right. Neoplastic and reactive/infectious/inflammatory causes are possible. One of the right axillary nodes should be amenable to ultrasound-guided biopsy if clinically warranted. If biopsy is not pursued, recommend close follow-up. 2. Splenomegaly. The known splenic infarcts are not well assessed on this unenhanced study. 3. Mild paraseptal emphysematous changes in the apices. Emphysema (ICD10-J43.9). Electronically Signed   By: Dorise Bullion III M.D   On: 08/04/2018 13:40   Ct Abdomen Pelvis W Contrast  Result Date: 08/03/2018 CLINICAL DATA:  38 year old male with abdominal  pain and vomiting. EXAM: CT ABDOMEN AND PELVIS WITH CONTRAST TECHNIQUE: Multidetector CT imaging of the abdomen and pelvis was performed using the standard protocol following bolus administration of intravenous contrast. CONTRAST:  174mL OMNIPAQUE IOHEXOL 300 MG/ML  SOLN COMPARISON:  CT of the abdomen pelvis dated 09/13/2016 FINDINGS: Lower chest: The visualized lung bases are clear. No intra-abdominal free air. There is a small free fluid within the pelvis. Hepatobiliary: The liver is enlarged measuring approximately 20 cm in midclavicular length. There is diffuse fatty infiltration of the liver. No intrahepatic biliary ductal dilatation. Cholecystectomy. No retained calcified stone identified in the central CBD. Pancreas: Unremarkable. No pancreatic ductal dilatation or surrounding inflammatory changes. Spleen: The spleen is enlarged measuring approximately 20 cm in craniocaudal length and 22 cm in AP length. 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 Adrenals/Urinary Tract: The adrenal glands are unremarkable. There is no hydronephrosis on either side. The visualized ureters and urinary bladder appear unremarkable. Stomach/Bowel: There is no bowel obstruction or active inflammation. The appendix is normal. Vascular/Lymphatic: The abdominal aorta and IVC are unremarkable. No portal venous gas. There is no adenopathy. Reproductive: The prostate and seminal vesicles are grossly unremarkable. No pelvic mass. Other: There is paucity of subcutaneous fat. There is diffuse subcutaneous edema. Musculoskeletal: No acute or significant osseous findings. IMPRESSION: 1. Hepatosplenomegaly with diffuse fatty infiltration of the liver. Significant enlargement of the spleen, new since the prior CT with multiple areas of splenic infarct. 2. No bowel obstruction or active inflammation. Normal appendix. 3. Small free fluid  within the pelvis and mild diffuse  subcutaneous edema. Electronically Signed   By: Anner Crete M.D.   On: 08/03/2018 20:12   ASSESSMENT AND PLAN:  Jamieon Lannen  is a 38 y.o. male with a known history of cyclical vomiting syndrome and GERD.  He presented to the emergency room reporting a 3-day history of intractable nausea and vomiting having become unable to eat or drink over the last 2 days without vomiting.  He reports having had no urine output over the last 24 hours.  He is also experiencing left upper abdominal pain which he describes as sharp and aching with a pain score 8-9 out of 10.  1.  intractable nausea vomiting with  Dehydration - Patient received 2 L normal saline on arrival to the emergency room.  He has normal saline infusing to peripheral IV at 125 cc/h. - Likely secondary to intractable nausea and vomiting with cyclical nausea and vomiting history.  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 Janese Banks with hematology/oncology consulted for further evaluation and recommendations-- will get TEE. Dr. Clayborn Bigness informed. -To be started on heparin drip. -CT chest shows significant lymphadenopathy. Will get ultrasound-guided core lymph node biopsy from right axilla tomorrow-- rule out lymphoma  3.  Elevated liver function studies - Hepatitis panel pending - Gastroenterology has been consulted for further evaluation and recommendations. -Liver doppler ordrered  4.  Epigastric and upper abdominal pain - Pain is being managed with IV analgesic - We will treat GERD with IV Protonix  5. Thrombocytopenia likely secondary to liver injury. ? Lymphoma -per Dr. Janese Banks peripheral smear does not schistocytes -LDH elevated   Case discussed with Care Management/Social Worker. Management plans discussed with the patient  , Dr Janese Banks and Dr Vicente Males  CODE STATUS: full  DVT Prophylaxis: heparin gtt  TOTAL TIME TAKING CARE OF THIS PATIENT: *40* minutes.  >50% time spent on counselling and coordination of care  POSSIBLE D/C IN few DAYS, DEPENDING ON CLINICAL CONDITION.  Note: This dictation was prepared with Dragon dictation along with smaller phrase technology. Any transcriptional errors that result from this process are unintentional.  Fritzi Mandes M.D on 08/04/2018 at 2:49 PM  Between 7am to 6pm - Pager - [phone]  After 6pm go to [email or web address] - password [email or web address] [place]  Office  [phone]  CC: Primary care physician; Albina Billet, MDPatient ID: Berline Lopes, male   DOB: [birth year], 38 y.o.   MRN: [id number]

## 2018-08-04 NOTE — H&P (Signed)
[facility] at [place] NAME: Dakota Bowen    MR#:  [id number]  DATE OF BIRTH:  1980-04-03  DATE OF ADMISSION:  08/03/2018  PRIMARY CARE PHYSICIAN: Albina Billet, MD   REQUESTING/REFERRING PHYSICIAN: Conni Slipper, MD  CHIEF COMPLAINT:   Chief Complaint  Patient presents with  . Emesis    HISTORY OF PRESENT ILLNESS:  Dakota Bowen  is a 38 y.o. male with a known history of cyclical vomiting syndrome and GERD.  He presented to the emergency room reporting a 3-day history of intractable nausea and vomiting having become unable to eat or drink over the last 2 days without vomiting.  He reports having had no urine output over the last 24 hours.  He is also experiencing left upper abdominal pain which he describes as sharp and aching with a pain score 8-9 out of 10.  This pain has become worse over the last 3 days as well.  He denies hematemesis, hematochezia, or melena.  He has noted no fevers or chills.  He denies chest pain.  He denies increased shortness of breath.  He reports his last bowel movement was approximately 1 week ago.  Rapid COVID-19 testing is negative.  Pertinent labs include elevated AST of 173 and ALT of 82 with alkaline phosphatase 479 and a total bilirubin 1.6.  Also thrombocytopenia with platelet count 44.  INR is 1.7 with PTT 40.  Urine drug screen is positive for opiates and cannabinoid.  CT abdomen demonstrated hepatosplenomegaly with diffuse fatty infiltration of the liver as well as significant enlargement of the spleen which was new since the prior CT with multiple areas of splenic infarct as well.  No evidence of bowel obstruction or active inflammation.  There is also small amount of free fluid within the pelvis and mild diffuse subcutaneous edema present.  CMV and EBV results are pending.  We have admitted him to the hospitalist service for further management.  PAST MEDICAL HISTORY:   Past Medical History:   Diagnosis Date  . Cyclical vomiting syndrome   . GERD (gastroesophageal reflux disease)   . GERD (gastroesophageal reflux disease)     PAST SURGICAL HISTORY:   Past Surgical History:  Procedure Laterality Date  . CHOLECYSTECTOMY    . ESOPHAGOGASTRODUODENOSCOPY (EGD) WITH PROPOFOL N/A 06/21/2014   Procedure: ESOPHAGOGASTRODUODENOSCOPY (EGD) WITH PROPOFOL. Looking in the esophagus stomach and upper small intestine with a lighted tube to examine and treat.;  Surgeon: Lollie Sails, MD;  Location: [facility] END[facility]COPY;  Service: Endoscopy;  Laterality: N/A;    SOCIAL HISTORY:   Social History   Tobacco Use  . Smoking status: Current Every Day Smoker    Packs/day: 1.50  . Smokeless tobacco: Never Used  Substance Use Topics  . Alcohol use: Yes    Comment: 3-4beers/day    FAMILY HISTORY:   Family History  Problem Relation Age of Onset  . Diabetes Mother   . Cancer Mother   . Diabetes Father     DRUG ALLERGIES:   PCN- Rash  REVIEW OF SYSTEMS:   Review of Systems  Constitutional: Positive for malaise/fatigue. Negative for chills, diaphoresis and fever.  HENT: Negative for congestion, nosebleeds, sinus pain and sore throat.   Eyes: Negative for blurred vision and double vision.  Respiratory: Negative for cough, hemoptysis, sputum production, shortness of breath and wheezing.   Cardiovascular: Negative for chest pain, palpitations and leg swelling.  Gastrointestinal: Positive for nausea and vomiting. Negative for abdominal  pain, blood in stool, constipation, diarrhea and melena.  Genitourinary: Negative for dysuria, flank pain, frequency, hematuria and urgency.  Musculoskeletal: Negative for falls, joint pain and myalgias.  Neurological: Negative for dizziness, seizures, loss of consciousness, weakness and headaches.  Psychiatric/Behavioral: Negative.  Negative for depression.      MEDICATIONS AT HOME:   Prior to Admission medications   Not on File      VITAL SIGNS:   Blood pressure 104/79, pulse 69, temperature (!) 97.5 F (36.4 C), temperature source Oral, resp. rate 18, height 6' (1.829 m), weight 68 kg, SpO2 98 %.  PHYSICAL EXAMINATION:  Physical Exam  GENERAL:  38 y.o.-year-old patient lying in the bed with no acute distress.  EYES: Pupils equal, round, reactive to light and accommodation. No scleral icterus. Extraocular muscles intact.  HEENT: Head atraumatic, normocephalic. Oropharynx and nasopharynx clear.  NECK:  Supple, no jugular venous distention. No thyroid enlargement, no tenderness.  LUNGS: Normal breath sounds bilaterally, no wheezing, rales,rhonchi or crepitation. No use of accessory muscles of respiration.  CARDIOVASCULAR: Regular rate and rhythm, S1, S2 normal. No murmurs, rubs, or gallops.  ABDOMEN: Soft, nondistended, Epigastric and left upper quadrant tenderness. Bowel sounds present. No organomegaly or mass.  EXTREMITIES: No pedal edema, cyanosis, or clubbing.  NEUROLOGIC: Cranial nerves II through XII are intact. Muscle strength 5/5 in all extremities. Sensation intact. Gait not checked.  PSYCHIATRIC: The patient is alert and oriented x 3.  Normal affect and good eye contact. SKIN: No obvious rash, lesion, or ulcer.   LABORATORY PANEL:   CBC Recent Labs  Lab 08/04/18 0244  WBC 6.6  HGB 16.2  HCT 48.9  PLT 39*   ------------------------------------------------------------------------------------------------------------------  Chemistries  Recent Labs  Lab 08/03/18 1655 08/04/18 0244  NA 133* 138  K 4.1 3.5  CL 93* 100  CO2 27 28  GLUCOSE 89 86  BUN 21* 18  CREATININE 0.81 0.82  CALCIUM 7.8* 7.2*  AST 173*  --   ALT 82*  --   ALKPHOS 479*  --   BILITOT 1.6*  --    ------------------------------------------------------------------------------------------------------------------  Cardiac Enzymes No results for input(s): TROPONINI in the last 168 hours.  ------------------------------------------------------------------------------------------------------------------  RADIOLOGY:  Dg Chest 2 View  Result Date: 08/03/2018 CLINICAL DATA:  Two-day history of wheezing, nausea and vomiting. Current smoker. EXAM: CHEST - 2 VIEW COMPARISON:  01/24/2017 and earlier. FINDINGS: Cardiomediastinal silhouette unremarkable and unchanged. Mildly prominent bronchovascular markings and mild central peribronchial thickening, unchanged over multiple prior examinations. Lungs otherwise clear. No localized airspace consolidation. No pleural effusions. No pneumothorax. Normal pulmonary vascularity. Visualized bony thorax intact. IMPRESSION: Stable mild changes of chronic bronchitis and/or asthma. No acute cardiopulmonary disease. Electronically Signed   By: Evangeline Dakin M.D.   On: 08/03/2018 18:52   Ct Abdomen Pelvis W Contrast  Result Date: 08/03/2018 CLINICAL DATA:  38 year old male with abdominal pain and vomiting. EXAM: CT ABDOMEN AND PELVIS WITH CONTRAST TECHNIQUE: Multidetector CT imaging of the abdomen and pelvis was performed using the standard protocol following bolus administration of intravenous contrast. CONTRAST:  161mL OMNIPAQUE IOHEXOL 300 MG/ML  SOLN COMPARISON:  CT of the abdomen pelvis dated 09/13/2016 FINDINGS: Lower chest: The visualized lung bases are clear. No intra-abdominal free air. There is a small free fluid within the pelvis. Hepatobiliary: The liver is enlarged measuring approximately 20 cm in midclavicular length. There is diffuse fatty infiltration of the liver. No intrahepatic biliary ductal dilatation. Cholecystectomy. No retained calcified stone identified in the central CBD. Pancreas: Unremarkable. No  pancreatic ductal dilatation or surrounding inflammatory changes. Spleen: The spleen is enlarged measuring approximately 20 cm in craniocaudal length and 22 cm in AP length. There are which shaped areas of hypoenhancement throughout the spleen  most consistent with areas of infarct. Adrenals/Urinary Tract: The adrenal glands are unremarkable. There is no hydronephrosis on either side. The visualized ureters and urinary bladder appear unremarkable. Stomach/Bowel: There is no bowel obstruction or active inflammation. The appendix is normal. Vascular/Lymphatic: The abdominal aorta and IVC are unremarkable. No portal venous gas. There is no adenopathy. Reproductive: The prostate and seminal vesicles are grossly unremarkable. No pelvic mass. Other: There is paucity of subcutaneous fat. There is diffuse subcutaneous edema. Musculoskeletal: No acute or significant osseous findings. IMPRESSION: 1. Hepatosplenomegaly with diffuse fatty infiltration of the liver. Significant enlargement of the spleen, new since the prior CT with multiple areas of splenic infarct. 2. No bowel obstruction or active inflammation. Normal appendix. 3. Small free fluid within the pelvis and mild diffuse subcutaneous edema. Electronically Signed   By: Anner Crete M.D.   On: 08/03/2018 20:12      IMPRESSION AND PLAN:   1.  Dehydration - Patient received 2 L normal saline on arrival to the emergency room.  He has normal saline infusing to peripheral IV at 125 cc/h. - Likely secondary to intractable nausea and vomiting with cyclical nausea and vomiting history.  Gastroenterology has been consulted for further evaluation and recommendations as well. - We will repeat CBC and BMP in the a.m. - Nausea and vomiting are being treated with IV antiemetic  2. splenic infarction - CMV and EBV are pending - Dr. Janese Banks with hematology/oncology consulted for further evaluation and recommendations.  3.  Elevated liver function studies - Hepatitis panel pending - Gastroenterology has been consulted for further evaluation and recommendations.  4.  Epigastric and upper abdominal pain - Pain is being managed with IV analgesic - We will treat GERD with IV Protonix  DVT prophylaxis  with SCDs and PPI prophylaxis initiated   All the records are reviewed and case discussed with ED provider. The plan of care was discussed in details with the patient (and family). I answered all questions. The patient agreed to proceed with the above mentioned plan. Further management will depend upon hospital course.   CODE STATUS: Full code  TOTAL TIME TAKING CARE OF THIS PATIENT:45 minutes.    [place] on 08/04/2018 at 4:23 AM  Pager - [phone]  After 6pm go to [email or web address] - Proofreader  [facility]  Office  [phone]  CC: Primary care physician; Albina Billet, MD   Note: This dictation was prepared with Dragon dictation along with smaller phrase technology. Any transcriptional errors that result from this process are unintentional.

## 2018-08-04 NOTE — Progress Notes (Signed)
Paged Gardiner Barefoot, NP about getting patient something for pain. He is having 8/10 rib pain.

## 2018-08-04 NOTE — ED Notes (Signed)
ED TO INPATIENT HANDOFF REPORT  ED Nurse Name and Phone #: Lelon Frohlich [phone]  S Name/Age/Gender Dakota Bowen 37 y.o. male Room/Bed: ED19A/ED19A  Code Status   Code Status: Full Code  Home/SNF/Other Home Patient oriented to: self, place, time and situation Is this baseline? Yes   Triage Complete: Triage complete  Chief Complaint Dehydrated weak L Side Pain  Triage Note Pt arrived via POV with reports of 3 days of vomiting. Pt c/o feeling dehydrated as well as abdominal pain.   Pt states he has vomited x 20 today and states he has been dry heaving.    Allergies  Allergies  Allergen Reactions  . Penicillins Itching, Rash and Other (See Comments)    Has patient had a PCN reaction causing immediate rash, facial/tongue/throat swelling, SOB or lightheadedness with hypotension: Yes Has patient had a PCN reaction causing severe rash involving mucus membranes or skin necrosis: No Has patient had a PCN reaction that required hospitalization No Has patient had a PCN reaction occurring within the last 10 years: No If all of the above answers are "NO", then may proceed with Cephalosporin use.     Level of Care/Admitting Diagnosis ED Disposition    ED Disposition Condition [facility] Area: [place] [[id number]]  Level of Care: Med-Surg [16]  Covid Evaluation: Confirmed COVID Negative  Diagnosis: Abdominal pain [[id number]]  Admitting Physician: Mayer Camel [[id number]]  Attending Physician: Mayer Camel [[id number]]  Estimated length of stay: past midnight tomorrow  Certification:: I certify this patient will need inpatient services for at least 2 midnights  PT Class (Do Not Modify): Inpatient [101]  PT Acc Code (Do Not Modify): Private [1]       B Medical/Surgery History Past Medical History:  Diagnosis Date  . Cyclical vomiting syndrome   . GERD (gastroesophageal reflux disease)   . GERD (gastroesophageal reflux disease)    Past Surgical  History:  Procedure Laterality Date  . CHOLECYSTECTOMY    . ESOPHAGOGASTRODUODENOSCOPY (EGD) WITH PROPOFOL N/A 06/21/2014   Procedure: ESOPHAGOGASTRODUODENOSCOPY (EGD) WITH PROPOFOL. Looking in the esophagus stomach and upper small intestine with a lighted tube to examine and treat.;  Surgeon: Lollie Sails, MD;  Location: [facility] ENDOSCOPY;  Service: Endoscopy;  Laterality: N/A;     A IV Location/Drains/Wounds Patient Lines/Drains/Airways Status   Active Line/Drains/Airways    Name:   Placement date:   Placement time:   Site:   Days:   Peripheral IV 08/03/18 Right Antecubital   08/03/18    1811    Antecubital   1          Intake/Output Last 24 hours No intake or output data in the 24 hours ending 08/04/18 0123  Labs/Imaging Results for orders placed or performed during the hospital encounter of 08/03/18 (from the past 48 hour(s))  Lipase, blood     Status: None   Collection Time: 08/03/18  4:55 PM  Result Value Ref Range   Lipase 23 11 - 51 U/L    Comment: Performed at [facility], [address] [place], [place] [address]  Comprehensive metabolic panel     Status: Abnormal   Collection Time: 08/03/18  4:55 PM  Result Value Ref Range   Sodium 133 (L) 135 - 145 mmol/L   Potassium 4.1 3.5 - 5.1 mmol/L   Chloride 93 (L) 98 - 111 mmol/L   CO2 27 22 - 32 mmol/L   Glucose, Bld 89 70 - 99 mg/dL  BUN 21 (H) 6 - 20 mg/dL   Creatinine, Ser 0.81 0.61 - 1.24 mg/dL   Calcium 7.8 (L) 8.9 - 10.3 mg/dL   Total Protein 5.8 (L) 6.5 - 8.1 g/dL   Albumin 3.4 (L) 3.5 - 5.0 g/dL   AST 173 (H) 15 - 41 U/L   ALT 82 (H) 0 - 44 U/L   Alkaline Phosphatase 479 (H) 38 - 126 U/L   Total Bilirubin 1.6 (H) 0.3 - 1.2 mg/dL   GFR calc non Af Amer >60 >60 mL/min   GFR calc Af Amer >60 >60 mL/min   Anion gap 13 5 - 15    Comment: Performed at [facility], [address] [place], [place] [address]  CBC     Status: Abnormal   Collection Time: 08/03/18  4:55 PM  Result Value Ref  Range   WBC 7.1 4.0 - 10.5 K/uL   RBC 5.39 4.22 - 5.81 MIL/uL   Hemoglobin 16.1 13.0 - 17.0 g/dL   HCT 47.4 39.0 - 52.0 %   MCV 87.9 80.0 - 100.0 fL   MCH 29.9 26.0 - 34.0 pg   MCHC 34.0 30.0 - 36.0 g/dL   RDW 14.4 11.5 - 15.5 %   Platelets 44 (L) 150 - 400 K/uL    Comment: Immature Platelet Fraction may be clinically indicated, consider ordering this additional test [id number]    nRBC 0.3 (H) 0.0 - 0.2 %    Comment: Performed at [facility], [address] [place], [place] [address]  CK     Status: None   Collection Time: 08/03/18  6:24 PM  Result Value Ref Range   Total CK 79 49 - 397 U/L    Comment: Performed at [facility], [address] [place], [place] [address]  Urinalysis, Complete w Microscopic     Status: Abnormal   Collection Time: 08/03/18  8:52 PM  Result Value Ref Range   Color, Urine YELLOW (A) YELLOW   APPearance CLEAR (A) CLEAR   Specific Gravity, Urine 1.038 (H) 1.005 - 1.030   pH 6.0 5.0 - 8.0   Glucose, UA NEGATIVE NEGATIVE mg/dL   Hgb urine dipstick NEGATIVE NEGATIVE   Bilirubin Urine NEGATIVE NEGATIVE   Ketones, ur NEGATIVE NEGATIVE mg/dL   Protein, ur NEGATIVE NEGATIVE mg/dL   Nitrite NEGATIVE NEGATIVE   Leukocytes,Ua NEGATIVE NEGATIVE   RBC / HPF 0-5 0 - 5 RBC/hpf   WBC, UA 0-5 0 - 5 WBC/hpf   Bacteria, UA NONE SEEN NONE SEEN   Squamous Epithelial / LPF 0-5 0 - 5   Mucus PRESENT     Comment: Performed at [facility], [address] [place],  [address]  Urine Drug Screen, Qualitative     Status: Abnormal   Collection Time: 08/03/18  8:52 PM  Result Value Ref Range   Tricyclic, Ur Screen NONE DETECTED NONE DETECTED   Amphetamines, Ur Screen NONE DETECTED NONE DETECTED   MDMA (Ecstasy)Ur Screen NONE DETECTED NONE DETECTED   Cocaine Metabolite,Ur [place] NONE DETECTED NONE DETECTED   Opiate, Ur Screen POSITIVE (A) NONE DETECTED   Phencyclidine (PCP) Ur S NONE DETECTED NONE DETECTED   Cannabinoid 50 Ng, Ur [place]  POSITIVE (A) NONE DETECTED   Barbiturates, Ur Screen NONE DETECTED NONE DETECTED   Benzodiazepine, Ur Scrn NONE DETECTED NONE DETECTED   Methadone Scn, Ur NONE DETECTED NONE DETECTED    Comment: (NOTE) Tricyclics + metabolites, urine    Cutoff 1000 ng/mL Amphetamines +  metabolites, urine  Cutoff 1000 ng/mL MDMA (Ecstasy), urine              Cutoff 500 ng/mL Cocaine Metabolite, urine          Cutoff 300 ng/mL Opiate + metabolites, urine        Cutoff 300 ng/mL Phencyclidine (PCP), urine         Cutoff 25 ng/mL Cannabinoid, urine                 Cutoff 50 ng/mL Barbiturates + metabolites, urine  Cutoff 200 ng/mL Benzodiazepine, urine              Cutoff 200 ng/mL Methadone, urine                   Cutoff 300 ng/mL The urine drug screen provides only a preliminary, unconfirmed analytical test result and should not be used for non-medical purposes. Clinical consideration and professional judgment should be applied to any positive drug screen result due to possible interfering substances. A more specific alternate chemical method must be used in order to obtain a confirmed analytical result. Gas chromatography / mass spectrometry (GC/MS) is the preferred confirmat ory method. Performed at [facility], [address], [place], [place] [address]   SARS Coronavirus 2 (CEPHEID- Performed in [facility] hospital lab), Hosp Order     Status: None   Collection Time: 08/03/18  9:39 PM   Specimen: Nasopharyngeal Swab  Result Value Ref Range   SARS Coronavirus 2 NEGATIVE NEGATIVE    Comment: (NOTE) If result is NEGATIVE SARS-CoV-2 target nucleic acids are NOT DETECTED. The SARS-CoV-2 RNA is generally detectable in upper and lower  respiratory specimens during the acute phase of infection. The lowest  concentration of SARS-CoV-2 viral copies this assay can detect is 250  copies / mL. A negative result does not preclude SARS-CoV-2 infection  and should not be used as the sole basis  for treatment or other  patient management decisions.  A negative result may occur with  improper specimen collection / handling, submission of specimen other  than nasopharyngeal swab, presence of viral mutation(s) within the  areas targeted by this assay, and inadequate number of viral copies  (<250 copies / mL). A negative result must be combined with clinical  observations, patient history, and epidemiological information. If result is POSITIVE SARS-CoV-2 target nucleic acids are DETECTED. The SARS-CoV-2 RNA is generally detectable in upper and lower  respiratory specimens dur ing the acute phase of infection.  Positive  results are indicative of active infection with SARS-CoV-2.  Clinical  correlation with patient history and other diagnostic information is  necessary to determine patient infection status.  Positive results do  not rule out bacterial infection or co-infection with other viruses. If result is PRESUMPTIVE POSTIVE SARS-CoV-2 nucleic acids MAY BE PRESENT.   A presumptive positive result was obtained on the submitted specimen  and confirmed on repeat testing.  While 2019 novel coronavirus  (SARS-CoV-2) nucleic acids may be present in the submitted sample  additional confirmatory testing may be necessary for epidemiological  and / or clinical management purposes  to differentiate between  SARS-CoV-2 and other Sarbecovirus currently known to infect humans.  If clinically indicated additional testing with an alternate test  methodology [phone]) is advised. The SARS-CoV-2 RNA is generally  detectable in upper and lower respiratory sp ecimens during the acute  phase of infection. The expected result is Negative. Fact Sheet for Patients:  StrictlyIdeas.no Fact  Sheet for Healthcare Providers: BankingDealers.co.za This test is not yet approved or cleared by the [place] and has been authorized for detection and/or  diagnosis of SARS-CoV-2 by FDA under an Emergency Use Authorization (EUA).  This EUA will remain in effect (meaning this test can be used) for the duration of the COVID-19 declaration under Section 564(b)(1) of the Act, 21 U.S.C. section 360bbb-3(b)(1), unless the authorization is terminated or revoked sooner. Performed at [facility], [address] [place], [place] [address]   Protime-INR     Status: Abnormal   Collection Time: 08/03/18 10:18 PM  Result Value Ref Range   Prothrombin Time 20.0 (H) 11.4 - 15.2 seconds   INR 1.7 (H) 0.8 - 1.2    Comment: (NOTE) INR goal varies based on device and disease states. Performed at [facility], [address] [place], [place] [address]   APTT     Status: Abnormal   Collection Time: 08/03/18 10:18 PM  Result Value Ref Range   aPTT 40 (H) 24 - 36 seconds    Comment:        IF BASELINE aPTT IS ELEVATED, SUGGEST PATIENT RISK ASSESSMENT BE USED TO DETERMINE APPROPRIATE ANTICOAGULANT THERAPY. Performed at [facility], [address] [place], [place] [address]    Dg Chest 2 View  Result Date: 08/03/2018 CLINICAL DATA:  Two-day history of wheezing, nausea and vomiting. Current smoker. EXAM: CHEST - 2 VIEW COMPARISON:  01/24/2017 and earlier. FINDINGS: Cardiomediastinal silhouette unremarkable and unchanged. Mildly prominent bronchovascular markings and mild central peribronchial thickening, unchanged over multiple prior examinations. Lungs otherwise clear. No localized airspace consolidation. No pleural effusions. No pneumothorax. Normal pulmonary vascularity. Visualized bony thorax intact. IMPRESSION: Stable mild changes of chronic bronchitis and/or asthma. No acute cardiopulmonary disease. Electronically Signed   By: Evangeline Dakin M.D.   On: 08/03/2018 18:52   Ct Abdomen Pelvis W Contrast  Result Date: 08/03/2018 CLINICAL DATA:  38 year old male with abdominal pain and vomiting. EXAM: CT ABDOMEN AND  PELVIS WITH CONTRAST TECHNIQUE: Multidetector CT imaging of the abdomen and pelvis was performed using the standard protocol following bolus administration of intravenous contrast. CONTRAST:  138mL OMNIPAQUE IOHEXOL 300 MG/ML  SOLN COMPARISON:  CT of the abdomen pelvis dated 09/13/2016 FINDINGS: Lower chest: The visualized lung bases are clear. No intra-abdominal free air. There is a small free fluid within the pelvis. Hepatobiliary: The liver is enlarged measuring approximately 20 cm in midclavicular length. There is diffuse fatty infiltration of the liver. No intrahepatic biliary ductal dilatation. Cholecystectomy. No retained calcified stone identified in the central CBD. Pancreas: Unremarkable. No pancreatic ductal dilatation or surrounding inflammatory changes. Spleen: The spleen is enlarged measuring approximately 20 cm in craniocaudal length and 22 cm in AP length. 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 Adrenals/Urinary Tract: The adrenal glands are unremarkable. There is no hydronephrosis on either side. The visualized ureters and urinary bladder appear unremarkable. Stomach/Bowel: There is no bowel obstruction or active inflammation. The appendix is normal. Vascular/Lymphatic: The abdominal aorta and IVC are unremarkable. No portal venous gas. There is no adenopathy. Reproductive: The prostate and seminal vesicles are grossly unremarkable. No pelvic mass. Other: There is paucity of subcutaneous fat. There is diffuse subcutaneous edema. Musculoskeletal: No acute or significant osseous findings. IMPRESSION: 1. Hepatosplenomegaly with diffuse fatty infiltration of the liver. Significant enlargement of the spleen, new since the prior CT with multiple areas of splenic infarct. 2. No bowel  obstruction or active inflammation. Normal appendix. 3. Small free fluid within the pelvis and mild diffuse subcutaneous edema. Electronically Signed   By:  Anner Crete M.D.   On: 08/03/2018 20:12    Pending Labs Unresulted Labs (From admission, onward)    Start     Ordered   08/04/18 0500  Pathologist smear review  Tomorrow morning,   STAT     08/03/18 2205   08/04/18 5732  Basic metabolic panel  Tomorrow morning,   STAT     08/04/18 0011   08/04/18 0500  CBC  Tomorrow morning,   STAT     08/04/18 0011   08/04/18 0500  Protime-INR  Tomorrow morning,   STAT     08/04/18 0011   08/04/18 0012  HIV antibody (Routine Testing)  Once,   STAT     08/04/18 0011   08/04/18 0012  TSH  Once,   STAT     08/04/18 0011   08/03/18 2210  Protein C activity  Once,   STAT     08/03/18 2209   08/03/18 2210  Protein C, total  Once,   STAT     08/03/18 2209   08/03/18 2210  Protein S activity  Once,   STAT     08/03/18 2209   08/03/18 2210  Protein S, total  Once,   STAT     08/03/18 2209   08/03/18 2207  ANA Comprehensive Panel  Once,   STAT     08/03/18 2207   08/03/18 2206  Beta-2-glycoprotein  labs, IgG/M/A  Once,   STAT     08/03/18 2205   08/03/18 2206  Cardiolipin antibodies, IgG, IgM, IgA  Once,   STAT     08/03/18 2205   08/03/18 2206  Hexagonal Phase Phospholipid  Once,   STAT     08/03/18 2205   08/03/18 2206  Lupus anticoagulant panel  Once,   STAT     08/03/18 2205   08/03/18 2206  BCR-ABL1 FISH  Once,   STAT     08/03/18 2205   08/03/18 2204  Culture, blood (routine x 2)  BLOOD CULTURE X 2,   STAT     08/03/18 2203   08/03/18 2204  JAK2 genotypr  Once,   STAT     08/03/18 2205   08/03/18 2203  Lactate dehydrogenase  Once,   STAT     08/03/18 2205   08/03/18 2144  RSV(respiratory syncytial virus) ab  (Viral Screen (EBV CAPSID AB+RSV+CMV AB) (PNL))  Once,   STAT     08/03/18 2143   08/03/18 2144  CMV IgM  (Viral Screen (EBV CAPSID AB+RSV+CMV AB) (PNL))  Once,   STAT     08/03/18 2143   08/03/18 2143  EPSTEIN-BARR VIRUS (EBV) Antibody Profile  Once,   STAT     08/03/18 2143          Vitals/Pain Today's Vitals    08/03/18 2145 08/03/18 2200 08/04/18 0030 08/04/18 0100  BP:  120/79 108/70 112/77  Pulse: 65 69 71 64  Resp:      Temp:      TempSrc:      SpO2: (!) 85% 98% 95% (!) 89%  Weight:      Height:      PainSc:        Isolation Precautions Airborne and Contact precautions  Medications Medications  0.9 %  sodium chloride infusion (has no administration in time range)  acetaminophen (TYLENOL) tablet  650 mg (has no administration in time range)    Or  acetaminophen (TYLENOL) suppository 650 mg (has no administration in time range)  polyethylene glycol (MIRALAX / GLYCOLAX) packet 17 g (has no administration in time range)  ondansetron (ZOFRAN) tablet 4 mg (has no administration in time range)    Or  ondansetron (ZOFRAN) injection 4 mg (has no administration in time range)  ondansetron (ZOFRAN-ODT) disintegrating tablet 4 mg (4 mg Oral Given 08/03/18 1654)  sodium chloride 0.9 % bolus 1,000 mL (0 mLs Intravenous Stopped 08/03/18 2049)  ondansetron (ZOFRAN) injection 4 mg (4 mg Intravenous Given 08/03/18 1822)  morphine 2 MG/ML injection 2 mg (2 mg Intravenous Given 08/03/18 1822)  sodium chloride 0.9 % bolus 1,000 mL (0 mLs Intravenous Stopped 08/03/18 2049)  ipratropium-albuterol (DUONEB) 0.5-2.5 (3) MG/3ML nebulizer solution 3 mL (3 mLs Nebulization Given 08/03/18 1846)  iohexol (OMNIPAQUE) 300 MG/ML solution 100 mL (100 mLs Intravenous Contrast Given 08/03/18 1957)  promethazine (PHENERGAN) injection 25 mg (25 mg Intravenous Given 08/03/18 2107)  morphine 4 MG/ML injection 4 mg (4 mg Intravenous Given 08/03/18 2326)    Mobility walks Low fall risk   Focused Assessments NA   R Recommendations: See Admitting Provider Note  Report given to:   Additional Notes:

## 2018-08-04 NOTE — Plan of Care (Signed)
  Problem: Education: Goal: Knowledge of [facility] Education information/materials will improve Outcome: Progressing Goal: Emotional status will improve Outcome: Progressing Goal: Mental status will improve Outcome: Progressing Goal: Verbalization of understanding the information provided will improve Outcome: Progressing   Problem: Activity: Goal: Interest or engagement in activities will improve Outcome: Progressing Goal: Sleeping patterns will improve Outcome: Progressing   Problem: Coping: Goal: Ability to verbalize frustrations and anger appropriately will improve Outcome: Progressing Goal: Ability to demonstrate self-control will improve Outcome: Progressing   Problem: Health Behavior/Discharge Planning: Goal: Identification of resources available to assist in meeting health care needs will improve Outcome: Progressing Goal: Compliance with treatment plan for underlying cause of condition will improve Outcome: Progressing   Problem: Physical Regulation: Goal: Ability to maintain clinical measurements within normal limits will improve Outcome: Progressing   Problem: Safety: Goal: Periods of time without injury will increase Outcome: Progressing   

## 2018-08-04 NOTE — Consult Note (Signed)
Dakota Bowen , MD [address] [place], [place], [place], [address] [id number], [place], [place], [place], [address] Phone: [phone]  Fax: [phone]  Consultation  Referring Provider:     Dr Posey Pronto  Primary Care Physician:  Albina Billet, MD Primary Gastroenterologist:  Dr. Gustavo Lah       Reason for Consultation:     Abnormal LFT's  Date of Admission:  08/03/2018 Date of Consultation:  08/04/2018         HPI:   Dakota Bowen is a 38 y.o. male has a prior medical history of polysubstance abuse, suicidal idealization, medication overdose.  He has had multiple ER visits for nausea vomiting consider cyclical vomiting syndrome.  He has seen Dr. Gustavo Lah in the past in 2016.  Urine drug screen over the past 1 year has shown THC in every urine sample and multiple samples also been positive for cocaine and amphetamines.  He says that he has not done any cocaine for a few months.  He has used heroin in the past but none recently.  Despite being suggested to have GI evaluation as an outpatient has not followed up.  He presented to the emergency room yesterday with emesis.  No urine output for the past 24 hours.  Abdominal pain.  And was noted to have elevated liver function tests as well as platelet count.  INR was elevated at 1.7.  Urine drug screen was again positive for opioids and cannabinoids.  He underwent a CT scan of the abdomen which demonstrated hepatosplenomegaly with diffuse fatty infiltration of the liver, significant enlargement of the spleen which is new since his last CT in 09/03/2016.  Also noted to have multiple areas of splenic infarcts.  Lipase was 23 normal, albumin 3.4, AST is 173, ALT 82, total bilirubin 1.6, alkaline phosphatase 479.  Platelet count was normalWhich demonstrated.A few months back but is now low at 44.  Hemoglobin is 16.1.  Total CK 79.   9 months back at 264 , dropped to 44 with a hemoglobin of 16.1.  COVID testing is negative.  Blood cultures x2 are  negative.  He states that he is to drink a lot of alcohol up to a year back and is to drink a sixpack a day but none recently.  He states that he has not lost any weight but been having night sweats.  Left-sided abdominal pain.  Past Medical History:  Diagnosis Date   Cyclical vomiting syndrome    GERD (gastroesophageal reflux disease)    GERD (gastroesophageal reflux disease)     Past Surgical History:  Procedure Laterality Date   CHOLECYSTECTOMY     ESOPHAGOGASTRODUODENOSCOPY (EGD) WITH PROPOFOL N/A 06/21/2014   Procedure: ESOPHAGOGASTRODUODENOSCOPY (EGD) WITH PROPOFOL. Looking in the esophagus stomach and upper small intestine with a lighted tube to examine and treat.;  Surgeon: Lollie Sails, MD;  Location: [facility] ENDOSCOPY;  Service: Endoscopy;  Laterality: N/A;    Prior to Admission medications   Not on File    Family History  Problem Relation Age of Onset   Diabetes Mother    Cancer Mother    Diabetes Father      Social History   Tobacco Use   Smoking status: Current Every Day Smoker    Packs/day: 1.50   Smokeless tobacco: Never Used  Substance Use Topics   Alcohol use: Yes    Comment: 3-4beers/day   Drug use: Yes    Types: Marijuana, Cocaine, Heroin    Allergies  as of 08/03/2018 - Review Complete 08/03/2018  Allergen Reaction Noted   Penicillins Itching, Rash, and Other (See Comments) 06/15/2014    Review of Systems:    All systems reviewed and negative except where noted in HPI.   Physical Exam:  Vital signs in last 24 hours: Temp:  [97.5 F (36.4 C)-98.8 F (37.1 C)] 98 F (36.7 C) (07/20 0503) Pulse Rate:  [58-82] 82 (07/20 0503) Resp:  [18] 18 (07/20 0503) BP: (104-128)/(70-89) 108/85 (07/20 0503) SpO2:  [72 %-100 %] 98 % (07/20 0503) Weight:  [68 kg] 68 kg (07/19 1650)   General:   Pleasant, cooperative in NAD Enlarged low right axillary lymph nodes 1 in the anterior axillary area was probably 2 cm in size, freely mobile firm in  nature another 1 in the lateral group was probably 1 cm in size also mobile.  Neither were tender. Head:  Normocephalic and atraumatic. Eyes:   No icterus.   Conjunctiva pink. PERRLA. Ears:  Normal auditory acuity. Neck:  Supple; no masses or thyroidomegaly Lungs: Respirations even and unlabored. Lungs clear to auscultation bilaterally.   No wheezes, crackles, or rhonchi.  Heart:  Regular rate and rhythm;  Without murmur, clicks, rubs or gallops Abdomen:  Soft, nondistended,Normal bowel sounds.  3 fingerbreadths beneath the right ribs nontender hepatomegaly.  Splenomegaly noted with tenderness in the left upper quadrant.  No rebound or guarding.  Right inguinal to lymph nodes enlarged firm in texture probably 1 to 1-1/2 cm in size. Neurologic:  Alert and oriented x3;  grossly normal neurologically. Skin:  Intact without significant lesions or rashes. Cervical Nodes:  No significant cervical adenopathy. Psych:  Alert and cooperative. Normal affect.  LAB RESULTS: Recent Labs    08/03/18 1655 08/04/18 0244  WBC 7.1 6.6  HGB 16.1 16.2  HCT 47.4 48.9  PLT 44* 39*   BMET Recent Labs    08/03/18 1655 08/04/18 0244  NA 133* 138  K 4.1 3.5  CL 93* 100  CO2 27 28  GLUCOSE 89 86  BUN 21* 18  CREATININE 0.81 0.82  CALCIUM 7.8* 7.2*   LFT Recent Labs    08/03/18 1655  PROT 5.8*  ALBUMIN 3.4*  AST 173*  ALT 82*  ALKPHOS 479*  BILITOT 1.6*   PT/INR Recent Labs    08/03/18 2218 08/04/18 0244  LABPROT 20.0* 19.6*  INR 1.7* 1.7*    STUDIES: Dg Chest 2 View  Result Date: 08/03/2018 CLINICAL DATA:  Two-day history of wheezing, nausea and vomiting. Current smoker. EXAM: CHEST - 2 VIEW COMPARISON:  01/24/2017 and earlier. FINDINGS: Cardiomediastinal silhouette unremarkable and unchanged. Mildly prominent bronchovascular markings and mild central peribronchial thickening, unchanged over multiple prior examinations. Lungs otherwise clear. No localized airspace consolidation. No  pleural effusions. No pneumothorax. Normal pulmonary vascularity. Visualized bony thorax intact. IMPRESSION: Stable mild changes of chronic bronchitis and/or asthma. No acute cardiopulmonary disease. Electronically Signed   By: Evangeline Dakin M.D.   On: 08/03/2018 18:52   Ct Abdomen Pelvis W Contrast  Result Date: 08/03/2018 CLINICAL DATA:  38 year old male with abdominal pain and vomiting. EXAM: CT ABDOMEN AND PELVIS WITH CONTRAST TECHNIQUE: Multidetector CT imaging of the abdomen and pelvis was performed using the standard protocol following bolus administration of intravenous contrast. CONTRAST:  158m OMNIPAQUE IOHEXOL 300 MG/ML  SOLN COMPARISON:  CT of the abdomen pelvis dated 09/13/2016 FINDINGS: Lower chest: The visualized lung bases are clear. No intra-abdominal free air. There is a small free fluid within the pelvis. Hepatobiliary:  The liver is enlarged measuring approximately 20 cm in midclavicular length. There is diffuse fatty infiltration of the liver. No intrahepatic biliary ductal dilatation. Cholecystectomy. No retained calcified stone identified in the central CBD. Pancreas: Unremarkable. No pancreatic ductal dilatation or surrounding inflammatory changes. Spleen: The spleen is enlarged measuring approximately 20 cm in craniocaudal length and 22 cm in AP length. 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 Adrenals/Urinary Tract: The adrenal glands are unremarkable. There is no hydronephrosis on either side. The visualized ureters and urinary bladder appear unremarkable. Stomach/Bowel: There is no bowel obstruction or active inflammation. The appendix is normal. Vascular/Lymphatic: The abdominal aorta and IVC are unremarkable. No portal venous gas. There is no adenopathy. Reproductive: The prostate and seminal vesicles are grossly unremarkable. No pelvic mass. Other: There is paucity of subcutaneous fat. There is diffuse  subcutaneous edema. Musculoskeletal: No acute or significant osseous findings. IMPRESSION: 1. Hepatosplenomegaly with diffuse fatty infiltration of the liver. Significant enlargement of the spleen, new since the prior CT with multiple areas of splenic infarct. 2. No bowel obstruction or active inflammation. Normal appendix. 3. Small free fluid within the pelvis and mild diffuse subcutaneous edema. Electronically Signed   By: Anner Crete M.D.   On: 08/03/2018 20:12      Impression / Plan:   DARIUSH MCNELLIS is a 38 y.o. y/o male with a history of prior polysubstance abuse, possible cannabinoid hyperemesis syndrome rather than a diagnosis of cyclical vomiting syndrome as he has been using marijuana for a long time.  He has been admitted again on this occasion with abdominal pain, emesis and found to have an acute rise in the liver function tests with an INR of 1.7 suggesting acute liver failure.  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     Plan 1.  Abnormal liver function tests new onset: Rule out acute viral hepatitis, ultrasound Doppler to rule out portal vein thrombosis check Tylenol levels.  Vitamin K to empirically treat any malnutrition related vitamin K deficiencies leading to elevated INR.  Suggest thiamine.    2.  Hepatosplenomegaly may be related to alcohol use.  Lymphoproliferative disorders can also cause the same.  Down the road could consider liver biopsy.  3.  Splenic infarcts: Await TEE echo report which will help to rule out any infective endocarditis, valvular disorders and PFO,Hematology is following determine if there is any hypercoagulable disorder leading to splenic infarcts as if present would require anticoagulation.  Other causes of splenic infarcts are blood-borne malignancy such as leukemia and lymphomas.  He does have enlarged lymph nodes in the right inguinal area in the right axillary area.  I have discussed with Dr. Janese Banks and it being discussed about getting him a  biopsy tomorrow.  A CT scan of the chest is also being planned.  If evaluation for endocarditis, thrombophilic disorders is negative may need a bone marrow biopsy.  I also suggest that if he does have a lymph node biopsy to get evaluated for tuberculosis.  He has a history of intravenous drug use and alcohol use and would be at higher risk for exposure.  This may or may not be related to the splenic infarcts.  4.  Emesis likely secondary to cannabinoid hyperemesis syndrome.  He would need to stop consuming marijuana in the future.  Show more compliance to GI follow-up.  Continue antiemetics, PPI  5.  Acute liver failure: Replace vitamin K, daily INR checking.Differentials include  toxins which could have caused abnormal liver function tests as well as liver failure, viral hepatitis.   Thank you for involving me in the care of this patient.      LOS: 0 days   Dakota Bellows, MD  08/04/2018, 9:43 AM

## 2018-08-04 NOTE — Progress Notes (Addendum)
Initial Nutrition Assessment  DOCUMENTATION CODES:   Not applicable  INTERVENTION:   Boost Breeze po TID, each supplement provides 250 kcal and 9 grams of protein  MVI daily   Pt likely at moderate refeed risk; recommend monitor K, Mg and P labs daily   NUTRITION DIAGNOSIS:   Inadequate oral intake related to acute illness as evidenced by other (comment)(pt on clear liquid diet).  GOAL:   Patient will meet greater than or equal to 90% of their needs  MONITOR:   PO intake, Supplement acceptance, Labs, Diet advancement, Weight trends, Skin, I & O's  REASON FOR ASSESSMENT:   Malnutrition Screening Tool    ASSESSMENT:   38 y.o. male with a known history of cyclical vomiting syndrome and GERD.  RD working remotely.  Pt with nausea and vomiting for 3 days pta. Pt's appetite is improving today; pt ate 100% of his clear liquid breakfast this morning. RD will add supplements and MVI to help pt meet his estimated needs. Pt likely at moderate refeed risk. Per chart, pt with weight gain pta.   Pt is at risk for malnutrition but unable to diagnose at this time as NFPE cannot be performed.   Medications reviewed and include: protonix, vitamin K, NaCl @125ml /hr  Labs reviewed:   Unable to complete Nutrition-Focused physical exam at this time.   Diet Order:   Diet Order            Diet clear liquid Room service appropriate? Yes; Fluid consistency: Thin  Diet effective now             EDUCATION NEEDS:   No education needs have been identified at this time  Skin:  Skin Assessment: Reviewed RN Assessment  Last BM:  pta  Height:   Ht Readings from Last 1 Encounters:  08/03/18 6' (1.829 m)    Weight:   Wt Readings from Last 1 Encounters:  08/03/18 68 kg    Ideal Body Weight:  80.9 kg  BMI:  Body mass index is 20.34 kg/m.  Estimated Nutritional Needs:   Kcal:  2100-2400kcal/day  Protein:  105-120g/day  Fluid:  >2L/day  Koleen Distance MS, RD,  LDN Pager #- [phone] Office#- [phone] After Hours Pager: [phone]

## 2018-08-04 NOTE — Progress Notes (Signed)
ANTICOAGULATION CONSULT NOTE - Initial Consult  Pharmacy Consult for Heparin drip Indication: splenic infarcts   Patient Measurements: Height: 6' (182.9 cm) Weight: 150 lb (68 kg) IBW/kg (Calculated) : 77.6 Heparin Dosing Weight: 68 kg  Vital Signs: Temp: 97.5 F (36.4 C) (07/20 1212) Temp Source: Oral (07/20 1212) BP: 111/75 (07/20 1212) Pulse Rate: 74 (07/20 1212)  Labs: Recent Labs    08/03/18 1655 08/03/18 1824 08/03/18 2218 08/04/18 0244  HGB 16.1  --   --  16.2  HCT 47.4  --   --  48.9  PLT 44*  --   --  39*  APTT  --   --  40*  --   LABPROT  --   --  20.0* 19.6*  INR  --   --  1.7* 1.7*  CREATININE 0.81  --   --  0.82  CKTOTAL  --  79  --   --     Estimated Creatinine Clearance: 115.2 mL/min (by C-G formula based on SCr of 0.82 mg/dL).   Medical History: Past Medical History:  Diagnosis Date  . Cyclical vomiting syndrome   . GERD (gastroesophageal reflux disease)   . GERD (gastroesophageal reflux disease)     Medications:  Scheduled:  . feeding supplement  1 Container Oral TID BM  . [START ON 08/05/2018] multivitamin with minerals  1 tablet Oral Daily  . pantoprazole (PROTONIX) IV  40 mg Intravenous Q12H  . phytonadione  10 mg Subcutaneous Daily   Infusions:  . sodium chloride 125 mL/hr at 08/04/18 1006  . heparin      Assessment: 38 yo male to start Heparin drip for splenic infarcts. Abnormal liver fx tests. Hx ETOH, marijuana, IV drug use. Hgb 16.2  Plt 39 INR 3.7 aptt 40  Oncology recommends starting Heparin drip at 1/2 the dose instead of full dose given his thrombocytopenia-monitor Plt  Goal of Therapy:  Heparin level 0.3-0.7 units/ml Monitor platelets by anticoagulation protocol: Yes   Plan:  Start heparin infusion at 650 units/hr Check anti-Xa level in 6 hours and daily while on heparin Continue to monitor H&H and platelets  Jagger Beahm A 08/04/2018,1:11 PM

## 2018-08-05 ENCOUNTER — Encounter: Payer: Self-pay | Admitting: Internal Medicine

## 2018-08-05 ENCOUNTER — Inpatient Hospital Stay
Admit: 2018-08-05 | Discharge: 2018-08-05 | Disposition: A | Discharge status: Home / Self Care | Payer: Medicaid Other | Attending: Emergency Medicine | Admitting: Emergency Medicine

## 2018-08-05 ENCOUNTER — Inpatient Hospital Stay: Payer: Medicaid Other

## 2018-08-05 ENCOUNTER — Encounter
Admission: EM | Disposition: A | Discharge status: Other Acute Inpatient Hospital | Payer: Self-pay | Source: Home / Self Care | Attending: Internal Medicine

## 2018-08-05 HISTORY — PX: TEE WITHOUT CARDIOVERSION: SHX5443

## 2018-08-05 LAB — RETICULOCYTES
Immature Retic Fract: 25.6 % — ABNORMAL HIGH (ref 2.3–15.9)
RBC.: 4.3 MIL/uL (ref 4.22–5.81)
Retic Count, Absolute: 102.3 10*3/uL (ref 19.0–186.0)
Retic Ct Pct: 2.4 % (ref 0.4–3.1)

## 2018-08-05 LAB — COMPREHENSIVE METABOLIC PANEL
ALT: 96 U/L — ABNORMAL HIGH (ref 0–44)
AST: 259 U/L — ABNORMAL HIGH (ref 15–41)
Albumin: 2.6 g/dL — ABNORMAL LOW (ref 3.5–5.0)
Alkaline Phosphatase: 377 U/L — ABNORMAL HIGH (ref 38–126)
Anion gap: 10 (ref 5–15)
BUN: 9 mg/dL (ref 6–20)
CO2: 22 mmol/L (ref 22–32)
Calcium: 6.8 mg/dL — ABNORMAL LOW (ref 8.9–10.3)
Chloride: 105 mmol/L (ref 98–111)
Creatinine, Ser: 0.65 mg/dL (ref 0.61–1.24)
GFR calc Af Amer: >60 mL/min (ref >60)
GFR calc non Af Amer: >60 mL/min (ref >60)
Glucose, Bld: 69 mg/dL — ABNORMAL LOW (ref 70–99)
Potassium: 3.8 mmol/L (ref 3.5–5.1)
Sodium: 137 mmol/L (ref 135–145)
Total Bilirubin: 1.8 mg/dL — ABNORMAL HIGH (ref 0.3–1.2)
Total Protein: 4.5 g/dL — ABNORMAL LOW (ref 6.5–8.1)

## 2018-08-05 LAB — ANA COMPREHENSIVE PANEL

## 2018-08-05 LAB — CBC
HCT: 39.1 % (ref 39.0–52.0)
HCT: 41 % (ref 39.0–52.0)
Hemoglobin: 12.8 g/dL — ABNORMAL LOW (ref 13.0–17.0)
Hemoglobin: 13.7 g/dL (ref 13.0–17.0)
MCH: 29.5 pg (ref 26.0–34.0)
MCH: 30.1 pg (ref 26.0–34.0)
MCHC: 32.7 g/dL (ref 30.0–36.0)
MCHC: 33.4 g/dL (ref 30.0–36.0)
MCV: 90.1 fL (ref 80.0–100.0)
MCV: 90.1 fL (ref 80.0–100.0)
Platelets: 27 10*3/uL — CL (ref 150–400)
Platelets: 25 10*3/uL — CL (ref 150–400)
RBC: 4.34 MIL/uL (ref 4.22–5.81)
RBC: 4.55 MIL/uL (ref 4.22–5.81)
RDW: 14.5 % (ref 11.5–15.5)
RDW: 14.7 % (ref 11.5–15.5)
WBC: 5.6 10*3/uL (ref 4.0–10.5)
WBC: 5.5 10*3/uL (ref 4.0–10.5)
nRBC: 1.4 % — ABNORMAL HIGH (ref 0.0–0.2)
nRBC: 1.5 % — ABNORMAL HIGH (ref 0.0–0.2)

## 2018-08-05 LAB — HEPATITIS C ANTIBODY: HCV Ab: <0.1 s/co ratio (ref 0.0–0.9)

## 2018-08-05 LAB — HEPARIN LEVEL (UNFRACTIONATED): Heparin Unfractionated: <0.1 IU/mL — ABNORMAL LOW (ref 0.30–0.70)

## 2018-08-05 LAB — HEPATITIS B CORE ANTIBODY, IGM: Hep B C IgM: NEGATIVE

## 2018-08-05 LAB — HEPATITIS A ANTIBODY, IGM: Hep A IgM: NEGATIVE

## 2018-08-05 LAB — CARDIOLIPIN ANTIBODIES, IGG, IGM, IGA
Anticardiolipin IgA: <9 APL U/mL (ref 0–11)
Anticardiolipin IgG: <9 GPL U/mL (ref 0–14)
Anticardiolipin IgM: <9 MPL U/mL (ref 0–12)

## 2018-08-05 LAB — PROTIME-INR
INR: 2 — ABNORMAL HIGH (ref 0.8–1.2)
Prothrombin Time: 22.3 seconds — ABNORMAL HIGH (ref 11.4–15.2)

## 2018-08-05 LAB — HIV ANTIBODY (ROUTINE TESTING W REFLEX): HIV Screen 4th Generation wRfx: NONREACTIVE

## 2018-08-05 LAB — APTT: aPTT: 50 seconds — ABNORMAL HIGH (ref 24–36)

## 2018-08-05 LAB — TECHNOLOGIST SMEAR REVIEW

## 2018-08-05 LAB — LACTATE DEHYDROGENASE: LDH: 913 U/L — ABNORMAL HIGH (ref 98–192)

## 2018-08-05 LAB — HEPATITIS B CORE ANTIBODY, TOTAL: Hep B Core Total Ab: NEGATIVE

## 2018-08-05 LAB — CMV IGM: CMV IgM: <30 AU/mL (ref 0.0–29.9)

## 2018-08-05 LAB — EPSTEIN-BARR VIRUS (EBV) ANTIBODY PROFILE
EBV NA IgG: 543 U/mL — ABNORMAL HIGH (ref 0.0–17.9)
EBV VCA IgG: 220 U/mL — ABNORMAL HIGH (ref 0.0–17.9)
EBV VCA IgM: <36 U/mL (ref 0.0–35.9)

## 2018-08-05 LAB — HEPATITIS B E ANTIBODY: Hep B E Ab: NEGATIVE

## 2018-08-05 LAB — FERRITIN: Ferritin: 214 ng/mL (ref 24–336)

## 2018-08-05 LAB — HEPATITIS B E ANTIGEN: Hep B E Ag: NEGATIVE

## 2018-08-05 LAB — HEPATITIS B SURFACE ANTIGEN: Hepatitis B Surface Ag: NEGATIVE

## 2018-08-05 SURGERY — ECHOCARDIOGRAM, TRANSESOPHAGEAL
Anesthesia: Moderate Sedation

## 2018-08-05 MED ORDER — ALUM & MAG HYDROXIDE-SIMETH 200-200-20 MG/5ML PO SUSP: 30.0000 mL | Freq: Four times a day (QID) | ORAL | Status: DC | PRN

## 2018-08-05 MED ORDER — SODIUM CHLORIDE 0.9 % IV SOLN
INTRAVENOUS | Status: DC
  Administered 2018-08-05: 17:00:00 via INTRAVENOUS

## 2018-08-05 MED ORDER — LIDOCAINE VISCOUS HCL 2 % MT SOLN
OROMUCOSAL | Status: AC
  Filled 2018-08-05: qty 15

## 2018-08-05 MED ORDER — FENTANYL CITRATE (PF) 100 MCG/2ML IJ SOLN
INTRAMUSCULAR | Status: AC | PRN
  Administered 2018-08-05 (×2): 50 ug via INTRAVENOUS

## 2018-08-05 MED ORDER — HYDROCODONE-ACETAMINOPHEN 5-325 MG PO TABS
1.0000 | ORAL_TABLET | ORAL | Status: DC | PRN
  Administered 2018-08-05 – 2018-08-08 (×12): 2 via ORAL
  Filled 2018-08-05 (×13): qty 2

## 2018-08-05 MED ORDER — MIDAZOLAM HCL 5 MG/5ML IJ SOLN
INTRAMUSCULAR | Status: AC | PRN
  Administered 2018-08-05: 2 mg via INTRAVENOUS
  Administered 2018-08-05: 1 mg via INTRAVENOUS
  Administered 2018-08-05: 2 mg via INTRAVENOUS

## 2018-08-05 MED ORDER — MIDAZOLAM HCL 5 MG/5ML IJ SOLN
INTRAMUSCULAR | Status: AC
  Filled 2018-08-05: qty 5

## 2018-08-05 MED ORDER — BUTAMBEN-TETRACAINE-BENZOCAINE 2-2-14 % EX AERO
INHALATION_SPRAY | CUTANEOUS | Status: AC
  Filled 2018-08-05: qty 5

## 2018-08-05 MED ORDER — SIMETHICONE 80 MG PO CHEW
80.0000 mg | CHEWABLE_TABLET | Freq: Three times a day (TID) | ORAL | Status: DC | PRN
  Administered 2018-08-05 – 2018-08-07 (×3): 80 mg via ORAL
  Filled 2018-08-05 (×4): qty 1

## 2018-08-05 MED ORDER — FENTANYL CITRATE (PF) 100 MCG/2ML IJ SOLN
INTRAMUSCULAR | Status: AC
  Filled 2018-08-05: qty 4

## 2018-08-05 MED ORDER — MIDAZOLAM HCL 5 MG/5ML IJ SOLN
INTRAMUSCULAR | Status: AC
  Filled 2018-08-05: qty 10

## 2018-08-05 MED ORDER — FENTANYL CITRATE (PF) 100 MCG/2ML IJ SOLN
INTRAMUSCULAR | Status: AC
  Filled 2018-08-05: qty 2

## 2018-08-05 MED ORDER — SODIUM CHLORIDE 0.9 % IV SOLN: INTRAVENOUS | Status: DC

## 2018-08-05 NOTE — Consult Note (Signed)
[facility] Consultation Note  Patient ID: Dakota Bowen, MRN: [id number], DOB/AGE: December 05, 1980 38 y.o. Admit date: 08/03/2018   Date of Consult: 08/05/2018 Primary Physician: Albina Billet, MD Primary Cardiologist: None  Chief Complaint:  Chief Complaint  Patient presents with  . Emesis   Reason for Consult: Splenic infarct  HPI: 38 y.o. male with no evidence of previous cardiovascular history with significant new onset of intractable nausea and vomiting and dehydration.  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  The patient still has left upper quadrant discomfort and pain but no other cardiovascular risk factors at this time. Transesophageal echocardiogram showing normal LV systolic function with ejection fraction of 60% with normal cardiac valves and no evidence of vegetation or probable source of embolus.  Patient does have a mild atrial septal aneurysm without evidence of patent foramen ovale or ASD  Past Medical History:  Diagnosis Date  . Cyclical vomiting syndrome   . GERD (gastroesophageal reflux disease)   . GERD (gastroesophageal reflux disease)       Surgical History:  Past Surgical History:  Procedure Laterality Date  . CHOLECYSTECTOMY    . ESOPHAGOGASTRODUODENOSCOPY (EGD) WITH PROPOFOL N/A 06/21/2014   Procedure: ESOPHAGOGASTRODUODENOSCOPY (EGD) WITH PROPOFOL. Looking in the esophagus stomach and upper small intestine with a lighted tube to examine and treat.;  Surgeon: Lollie Sails, MD;  Location: [facility] END[facility]COPY;  Service: Endoscopy;  Laterality: N/A;     Home Meds: Prior to Admission medications   Not on File    Inpatient Medications:  . butamben-tetracaine-benzocaine      . [MAR Hold] feeding supplement  1 Container Oral TID BM  . fentaNYL      . lidocaine      . midazolam      . [MAR Hold] multivitamin with minerals  1 tablet Oral Daily  . [MAR Hold] nicotine  21 mg  Transdermal Daily  . [MAR Hold] pantoprazole (PROTONIX) IV  40 mg Intravenous Q12H  . [MAR Hold] phytonadione  10 mg Subcutaneous Daily   . sodium chloride 75 mL/hr at 08/05/18 6301    Allergies:     Social History   Socioeconomic History  . Marital status: Single    Spouse name: Not on file  . Number of children: Not on file  . Years of education: Not on file  . Highest education level: Not on file  Occupational History  . Not on file  Social Needs  . Financial resource strain: Not on file  . Food insecurity    Worry: Not on file    Inability: Not on file  . Transportation needs    Medical: Not on file    Non-medical: Not on file  Tobacco Use  . Smoking status: Current Every Day Smoker    Packs/day: 1.50  . Smokeless tobacco: Never Used  Substance and Sexual Activity  . Alcohol use: Yes    Comment: 3-4beers/day  . Drug use: Yes    Types: Marijuana, Cocaine, Heroin  . Sexual activity: Not on file  Lifestyle  . Physical activity    Days per week: Not on file    Minutes per session: Not on file  . Stress: Not on file  Relationships  . Social Herbalist on phone: Not on file    Gets together: Not on file    Attends religious service: Not on file    Active member of  club or organization: Not on file    Attends meetings of clubs or organizations: Not on file    Relationship status: Not on file  . Intimate partner violence    Fear of current or ex partner: Not on file    Emotionally abused: Not on file    Physically abused: Not on file    Forced sexual activity: Not on file  Other Topics Concern  . Not on file  Social History Narrative  . Not on file     Family History  Problem Relation Age of Onset  . Diabetes Mother   . Cancer Mother   . Diabetes Father      Review of Systems Positive for nausea vomiting Negative for: General:  chills, fever, night sweats or weight changes.  Cardiovascular: PND orthopnea syncope dizziness  Dermatological  skin lesions rashes Respiratory: Cough congestion Urologic: Frequent urination urination at night and hematuria Abdominal: Positive for  nausea, vomiting, he had it for diarrhea, bright red blood per rectum, melena, or hematemesis Neurologic: negative for visual changes, and/or hearing changes  All other systems reviewed and are otherwise negative except as noted above.  Labs: Recent Labs    08/03/18 1824  CKTOTAL 79   Lab Results  Component Value Date   WBC 5.5 08/05/2018   HGB 13.7 08/05/2018   HCT 41.0 08/05/2018   MCV 90.1 08/05/2018   PLT 25 (LL) 08/05/2018    Recent Labs  Lab 08/05/18 0949  NA 137  K 3.8  CL 105  CO2 22  BUN 9  CREATININE 0.65  CALCIUM 6.8*  PROT 4.5*  BILITOT 1.8*  ALKPHOS 377*  ALT 96*  AST 259*  GLUCOSE 69*   No results found for: CHOL, HDL, LDLCALC, TRIG No results found for: DDIMER  Radiology/Studies:  Dg Chest 2 View  Result Date: 08/03/2018 CLINICAL DATA:  Two-day history of wheezing, nausea and vomiting. Current smoker. EXAM: CHEST - 2 VIEW COMPARISON:  01/24/2017 and earlier. FINDINGS: Cardiomediastinal silhouette unremarkable and unchanged. Mildly prominent bronchovascular markings and mild central peribronchial thickening, unchanged over multiple prior examinations. Lungs otherwise clear. No localized airspace consolidation. No pleural effusions. No pneumothorax. Normal pulmonary vascularity. Visualized bony thorax intact. IMPRESSION: Stable mild changes of chronic bronchitis and/or asthma. No acute cardiopulmonary disease. Electronically Signed   By: Evangeline Dakin M.D.   On: 08/03/2018 18:52   Ct Chest W Contrast  Result Date: 08/04/2018 CLINICAL DATA:  Nausea and upper abdominal pain. 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 EXAM: CT CHEST WITH CONTRAST TECHNIQUE: Multidetector CT imaging of the chest was performed during intravenous contrast administration. CONTRAST:  42mL OMNIPAQUE IOHEXOL 300 MG/ML  SOLN  COMPARISON:  CT scan of the abdomen and pelvis August 03, 2018 FINDINGS: Cardiovascular: The heart size is normal. The thoracic aorta is nonaneurysmal with no dissection or atherosclerotic changes. Central pulmonary arteries are normal in caliber. No coronary artery calcifications are identified. Mediastinum/Nodes: Adenopathy in the axilla, right greater than left. A representative node in the right axilla on series 2, image 40 measures 2.1 cm. Retro pectoral adenopathy is seen on the right is well. No retropectoral adenopathy on the left. There is a mildly prominent epicardial node on series 2, image 120 measuring 8 mm, not definitely abnormal. A retrocrural node measures 11 mm on series 2, image 75, borderline. A few other shotty nodes are seen in the mediastinum without gross mediastinal adenopathy. No hilar adenopathy is identified. Lungs/Pleura: Central airways are normal.  Paraseptal emphysematous changes in the apices, right greater than left. No pneumothorax. No suspicious nodules or masses. Dependent atelectasis is seen in the bases. There is a tiny left pleural effusion. Upper Abdomen: Splenomegaly is again identified. The patient's known splenic infarcts are not well assessed without contrast today. No other abnormalities in the upper abdomen. Musculoskeletal: No chest wall abnormality. No acute or significant osseous findings. IMPRESSION: 1. There is adenopathy in the bilateral axilla, right much greater than left. There is also retropectoral adenopathy on the right. Neoplastic and reactive/infectious/inflammatory causes are possible. One of the right axillary nodes should be amenable to ultrasound-guided biopsy if clinically warranted. If biopsy is not pursued, recommend close follow-up. 2. Splenomegaly. The known splenic infarcts are not well assessed on this unenhanced study. 3. Mild paraseptal emphysematous changes in the apices. Emphysema (ICD10-J43.9). Electronically Signed   By: Dorise Bullion III  M.D   On: 08/04/2018 13:40   Ct Abdomen Pelvis W Contrast  Result Date: 08/03/2018 CLINICAL DATA:  38 year old male with abdominal pain and vomiting. EXAM: CT ABDOMEN AND PELVIS WITH CONTRAST TECHNIQUE: Multidetector CT imaging of the abdomen and pelvis was performed using the standard protocol following bolus administration of intravenous contrast. CONTRAST:  153mL OMNIPAQUE IOHEXOL 300 MG/ML  SOLN COMPARISON:  CT of the abdomen pelvis dated 09/13/2016 FINDINGS: Lower chest: The visualized lung bases are clear. No intra-abdominal free air. There is a small free fluid within the pelvis. Hepatobiliary: The liver is enlarged measuring approximately 20 cm in midclavicular length. There is diffuse fatty infiltration of the liver. No intrahepatic biliary ductal dilatation. Cholecystectomy. No retained calcified stone identified in the central CBD. Pancreas: Unremarkable. No pancreatic ductal dilatation or surrounding inflammatory changes. Spleen: The spleen is enlarged measuring approximately 20 cm in craniocaudal length and 22 cm in AP length. 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 Adrenals/Urinary Tract: The adrenal glands are unremarkable. There is no hydronephrosis on either side. The visualized ureters and urinary bladder appear unremarkable. Stomach/Bowel: There is no bowel obstruction or active inflammation. The appendix is normal. Vascular/Lymphatic: The abdominal aorta and IVC are unremarkable. No portal venous gas. There is no adenopathy. Reproductive: The prostate and seminal vesicles are grossly unremarkable. No pelvic mass. Other: There is paucity of subcutaneous fat. There is diffuse subcutaneous edema. Musculoskeletal: No acute or significant osseous findings. IMPRESSION: 1. Hepatosplenomegaly with diffuse fatty infiltration of the liver. Significant enlargement of the spleen, new since the prior CT with multiple areas of splenic  infarct. 2. No bowel obstruction or active inflammation. Normal appendix. 3. Small free fluid within the pelvis and mild diffuse subcutaneous edema. Electronically Signed   By: Anner Crete M.D.   On: 08/03/2018 20:12   US Liver Doppler  Result Date: 08/05/2018 CLINICAL DATA:  Abnormal liver function tests. EXAM: DUPLEX ULTRASOUND OF LIVER TECHNIQUE: Color and duplex Doppler ultrasound was performed to evaluate the hepatic in-flow and out-flow vessels. COMPARISON:  CT scan of August 03, 2018. FINDINGS: Liver: Increased echogenicity of hepatic parenchyma is noted. Normal hepatic contour without nodularity. No focal lesion, mass or intrahepatic biliary ductal dilatation. Main Portal Vein size: 1.73 cm Portal Vein Velocities Main Prox:  41.1 cm/sec Main Mid: 28.3 cm/sec Main Dist:  35.6 cm/sec Right: 21.4 cm/sec Left: 24.9 cm/sec Normal hepatopetal flow is noted in the portal veins. Hepatic Vein Velocities Right:  41.1 cm/sec Middle:  40.6 cm/sec Left:  34.9 cm/sec Normal hepatofugal flow is noted in the hepatic vein.  IVC: Present and patent with normal respiratory phasicity. Hepatic Artery Velocity:  103.2 cm/sec Splenic Vein Velocity:  22 cm/sec Spleen: 16.25 cm x 20.7 cm x 10.63 cm with a total volume of 1872 cm^3 (411 cm^3 is upper limit normal) Portal Vein Occlusion/Thrombus: No Splenic Vein Occlusion/Thrombus: No Ascites: None Varices: None IMPRESSION: No evidence of portal, hepatic or splenic venous thrombosis or occlusion. Increased echogenicity of hepatic parenchyma is noted suggesting hepatic steatosis. Severe splenomegaly is noted. Electronically Signed   By: Marijo Conception M.D.   On: 08/05/2018 12:13   [place] Core Biopsy (lymph Nodes)  Result Date: 08/05/2018 INDICATION: Lymphadenopathy, particularly in the right axillary region. Hepatosplenomegaly and splenic infarcts. EXAM: ULTRASOUND GUIDED CORE BIOPSY OF RIGHT AXILLARY LYMPH NODE MEDICATIONS: None. ANESTHESIA/SEDATION: None PROCEDURE: The  procedure, risks, benefits, and alternatives were explained to the patient. Questions regarding the procedure were encouraged and answered. The patient understands and consents to the procedure. A time-out was performed prior to initiating the procedure. The right axilla was prepped with chlorhexidine in a sterile fashion, and a sterile drape was applied covering the operative field. A sterile gown and sterile gloves were used for the procedure. Local anesthesia was provided with 1% Lidocaine. Ultrasound was performed to localize right axillary lymph nodes. After choosing a lymph node for biopsy, a 16 gauge core biopsy device was utilized in obtaining 4 separate core biopsy samples of an enlarged lymph node. Core biopsy samples were submitted on Telfa gauze soaked with sterile saline. COMPLICATIONS: None immediate. FINDINGS: There are multiple enlarged right axillary lymph nodes. The largest measures approximately 3.5 x 1.9 x 1.8 cm. Solid tissue was obtained from this lymph node. IMPRESSION: Ultrasound-guided core biopsy performed of an enlarged right axillary lymph node measuring 3.5 cm in greatest dimension. Electronically Signed   By: Aletta Edouard M.D.   On: 08/05/2018 11:40    EKG: Normal sinus rhythm  Weights: Filed Weights   08/03/18 1650  Weight: 68 kg     Physical Exam: Blood pressure 100/78, pulse 94, temperature 98.2 F (36.8 C), temperature source Oral, resp. rate 20, height 6' (1.829 m), weight 68 kg, SpO2 98 %. Body mass index is 20.34 kg/m. General: Well developed, well nourished, in no acute distress. Head eyes ears nose throat: Normocephalic, atraumatic, sclera non-icteric, no xanthomas, nares are without discharge. No apparent thyromegaly and/or mass  Lungs: Normal respiratory effort.  no wheezes, no rales, no rhonchi.  Heart: RRR with normal S1 S2. no murmur gallop, no rub, PMI is normal size and placement, carotid upstroke normal without bruit, jugular venous pressure is  normal Abdomen: Soft, with left upper-tender, non-distended with normoactive bowel sounds. No hepatomegaly. No rebound/guarding. No obvious abdominal masses. Abdominal aorta is normal size without bruit Extremities: No edema. no cyanosis, no clubbing, no ulcers  Peripheral : 2+ bilateral upper extremity pulses, 2+ bilateral femoral pulses, 2+ bilateral dorsal pedal pulse Neuro: Alert and oriented. No facial asymmetry. No focal deficit. Moves all extremities spontaneously. Musculoskeletal: Normal muscle tone without kyphosis Psych:  Responds to questions appropriately with a normal affect.    Assessment: 38 year old male with no evidence of cardiovascular risk factors having splenic infarct and intractable nausea vomiting and left upper quadrant pain without evidence of cardiac source of embolus or endocarditis or vegetation  Plan: 1.  Continue supportive care of left upper quadrant tenderness pain nausea vomiting 2.  No further cardiac diagnostics necessary at this time 3.  Call if further questions  Signed, StarD.  [facility] 08/05/2018, 12:58 PM

## 2018-08-05 NOTE — Progress Notes (Signed)
[facility] [place] NAME: Dakota Bowen    MR#:  [id number]  DATE OF BIRTH:  01/04/1981  SUBJECTIVE:   continues with significant abdominal pain left upper quadrant along with nausea . No vomiting. REVIEW OF SYSTEMS:   Review of Systems  Constitutional: Negative for chills, fever and weight loss.  HENT: Negative for ear discharge, ear pain and nosebleeds.   Eyes: Negative for blurred vision, pain and discharge.  Respiratory: Negative for sputum production, shortness of breath, wheezing and stridor.   Cardiovascular: Negative for chest pain, palpitations, orthopnea and PND.  Gastrointestinal: Negative for abdominal pain, diarrhea, nausea and vomiting.  Genitourinary: Negative for frequency and urgency.  Musculoskeletal: Negative for back pain and joint pain.  Neurological: Negative for sensory change, speech change, focal weakness and weakness.  Psychiatric/Behavioral: Negative for depression and hallucinations. The patient is not nervous/anxious.    Tolerating Diet: npo  DRUG ALLERGIES:     VITALS:  Blood pressure 108/77, pulse 68, temperature 97.7 F (36.5 C), temperature source Oral, resp. rate 17, height 6' (1.829 m), weight 68 kg, SpO2 96 %.  PHYSICAL EXAMINATION:   Physical Exam  GENERAL:  38 y.o.-year-old patient lying in the bed with no acute distress.  EYES: Pupils equal, round, reactive to light and accommodation. No scleral icterus. Extraocular muscles intact.  HEENT: Head atraumatic, normocephalic. Oropharynx and nasopharynx clear.  NECK:  Supple, no jugular venous distention. No thyroid enlargement, no tenderness.  LUNGS: Normal breath sounds bilaterally, no wheezing, rales, rhonchi. No use of accessory muscles of respiration.  CARDIOVASCULAR: S1, S2 normal. No murmurs, rubs, or gallops.  ABDOMEN: Soft, left upper quadrant tender, distended. Bowel sounds present. No organomegaly or mass.  EXTREMITIES: No  cyanosis, clubbing or edema b/l.    NEUROLOGIC: Cranial nerves II through XII are intact. No focal Motor or sensory deficits b/l.   PSYCHIATRIC:  patient is alert and oriented x 3.  SKIN: No obvious rash, lesion, or ulcer. No petechial rash  LABORATORY PANEL:  CBC Recent Labs  Lab 08/05/18 0355  WBC 5.6  HGB 12.8*  HCT 39.1  PLT 27*    Chemistries  Recent Labs  Lab 08/03/18 1655 08/04/18 0244  NA 133* 138  K 4.1 3.5  CL 93* 100  CO2 27 28  GLUCOSE 89 86  BUN 21* 18  CREATININE 0.81 0.82  CALCIUM 7.8* 7.2*  AST 173*  --   ALT 82*  --   ALKPHOS 479*  --   BILITOT 1.6*  --    Cardiac Enzymes No results for input(s): TROPONINI in the last 168 hours. RADIOLOGY:  Dg Chest 2 View  Result Date: 08/03/2018 CLINICAL DATA:  Two-day history of wheezing, nausea and vomiting. Current smoker. EXAM: CHEST - 2 VIEW COMPARISON:  01/24/2017 and earlier. FINDINGS: Cardiomediastinal silhouette unremarkable and unchanged. Mildly prominent bronchovascular markings and mild central peribronchial thickening, unchanged over multiple prior examinations. Lungs otherwise clear. No localized airspace consolidation. No pleural effusions. No pneumothorax. Normal pulmonary vascularity. Visualized bony thorax intact. IMPRESSION: Stable mild changes of chronic bronchitis and/or asthma. No acute cardiopulmonary disease. Electronically Signed   By: Evangeline Dakin M.D.   On: 08/03/2018 18:52   Ct Chest W Contrast  Result Date: 08/04/2018 CLINICAL DATA:  Nausea and upper abdominal pain. 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 EXAM: CT CHEST WITH CONTRAST TECHNIQUE: Multidetector CT imaging of the chest was performed during intravenous contrast administration. CONTRAST:  61mL  OMNIPAQUE IOHEXOL 300 MG/ML  SOLN COMPARISON:  CT scan of the abdomen and pelvis August 03, 2018 FINDINGS: Cardiovascular: The heart size is normal. The thoracic aorta is nonaneurysmal with no dissection or atherosclerotic  changes. Central pulmonary arteries are normal in caliber. No coronary artery calcifications are identified. Mediastinum/Nodes: Adenopathy in the axilla, right greater than left. A representative node in the right axilla on series 2, image 40 measures 2.1 cm. Retro pectoral adenopathy is seen on the right is well. No retropectoral adenopathy on the left. There is a mildly prominent epicardial node on series 2, image 120 measuring 8 mm, not definitely abnormal. A retrocrural node measures 11 mm on series 2, image 75, borderline. A few other shotty nodes are seen in the mediastinum without gross mediastinal adenopathy. No hilar adenopathy is identified. Lungs/Pleura: Central airways are normal. Paraseptal emphysematous changes in the apices, right greater than left. No pneumothorax. No suspicious nodules or masses. Dependent atelectasis is seen in the bases. There is a tiny left pleural effusion. Upper Abdomen: Splenomegaly is again identified. The patient's known splenic infarcts are not well assessed without contrast today. No other abnormalities in the upper abdomen. Musculoskeletal: No chest wall abnormality. No acute or significant osseous findings. IMPRESSION: 1. There is adenopathy in the bilateral axilla, right much greater than left. There is also retropectoral adenopathy on the right. Neoplastic and reactive/infectious/inflammatory causes are possible. One of the right axillary nodes should be amenable to ultrasound-guided biopsy if clinically warranted. If biopsy is not pursued, recommend close follow-up. 2. Splenomegaly. The known splenic infarcts are not well assessed on this unenhanced study. 3. Mild paraseptal emphysematous changes in the apices. Emphysema (ICD10-J43.9). Electronically Signed   By: Dorise Bullion III M.D   On: 08/04/2018 13:40   Ct Abdomen Pelvis W Contrast  Result Date: 08/03/2018 CLINICAL DATA:  38 year old male with abdominal pain and vomiting. EXAM: CT ABDOMEN AND PELVIS WITH  CONTRAST TECHNIQUE: Multidetector CT imaging of the abdomen and pelvis was performed using the standard protocol following bolus administration of intravenous contrast. CONTRAST:  160mL OMNIPAQUE IOHEXOL 300 MG/ML  SOLN COMPARISON:  CT of the abdomen pelvis dated 09/13/2016 FINDINGS: Lower chest: The visualized lung bases are clear. No intra-abdominal free air. There is a small free fluid within the pelvis. Hepatobiliary: The liver is enlarged measuring approximately 20 cm in midclavicular length. There is diffuse fatty infiltration of the liver. No intrahepatic biliary ductal dilatation. Cholecystectomy. No retained calcified stone identified in the central CBD. Pancreas: Unremarkable. No pancreatic ductal dilatation or surrounding inflammatory changes. Spleen: The spleen is enlarged measuring approximately 20 cm in craniocaudal length and 22 cm in AP length. 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 Adrenals/Urinary Tract: The adrenal glands are unremarkable. There is no hydronephrosis on either side. The visualized ureters and urinary bladder appear unremarkable. Stomach/Bowel: There is no bowel obstruction or active inflammation. The appendix is normal. Vascular/Lymphatic: The abdominal aorta and IVC are unremarkable. No portal venous gas. There is no adenopathy. Reproductive: The prostate and seminal vesicles are grossly unremarkable. No pelvic mass. Other: There is paucity of subcutaneous fat. There is diffuse subcutaneous edema. Musculoskeletal: No acute or significant osseous findings. IMPRESSION: 1. Hepatosplenomegaly with diffuse fatty infiltration of the liver. Significant enlargement of the spleen, new since the prior CT with multiple areas of splenic infarct. 2. No bowel obstruction or active inflammation. Normal appendix. 3. Small free fluid within the pelvis and mild diffuse subcutaneous edema. Electronically  Signed   By: Anner Crete M.D.   On: 08/03/2018 20:12   ASSESSMENT AND PLAN:  Dakota Bowen  is a 38 y.o. male with a known history of cyclical vomiting syndrome and GERD.  He presented to the emergency room reporting a 3-day history of intractable nausea and vomiting having become unable to eat or drink over the last 2 days without vomiting.  He reports having had no urine output over the last 24 hours.  He is also experiencing left upper abdominal pain which he describes as sharp and aching with a pain score 8-9 out of 10.  1.  intractable nausea vomiting with  Dehydration - Patient received 2 L normal saline on arrival to the emergency room.  He has normal saline infusing to peripheral IV at 125 cc/h.--much improved decrease IV and start po diet after TEE today - Likely secondary to intractable nausea and vomiting with cyclical nausea and vomiting history.  - prn IV antiemetic  2. 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 Janese Banks with hematology/oncology consulted for further evaluation and recommendations-- will get TEE. Dr. Clayborn Bigness informed. -now on heparin drip. -CT chest shows significant lymphadenopathy. Will get ultrasound-guided core lymph node biopsy from right axilla today- rule out lymphoma -BM biopsy tomorrow with IR  3.  Elevated liver function studies - Hepatitis panel pending - Gastroenterology Dr Georgeann Oppenheim input noted  -Liver doppler pendig  4.  Epigastric and upper abdominal pain due to splenic infacrts - Pain is being managed with IV analgesic -  GERD with IV Protonix  5. Thrombocytopenia likely secondary to liver injury/? Lymphoma -per Dr. Janese Banks peripheral smear does not schistocytes -LDH elevated 1117--913 -Plt count 39K--27K--Plt at noon -transfuse plts as needed. No bleeding   Case discussed with Care Management/Social Worker. Management plans discussed with the patient  , Dr Janese Banks   CODE STATUS: full  DVT Prophylaxis: heparin gtt  TOTAL TIME TAKING CARE OF THIS PATIENT: *30* minutes.  >50% time spent on counselling and coordination of care  POSSIBLE D/C IN few DAYS, DEPENDING ON CLINICAL CONDITION.  Note: This dictation was prepared with Dragon dictation along with smaller phrase technology. Any transcriptional errors that result from this process are unintentional.  Fritzi Mandes M.D on 08/05/2018 at 8:24 AM  Between 7am to 6pm - Pager - [phone]  After 6pm go to [email or web address] - password [email or web address] [facility]  Office  [phone]  CC: Primary care physician; Albina Billet, MDPatient ID: Dakota Bowen, male   DOB: [birth year], 38 y.o.   MRN: [id number]

## 2018-08-05 NOTE — Progress Notes (Addendum)
Notified dr. Nehemiah Massed and dr. patel via epic messenger of patient's platelet count of 27 today.  No response from dr. Nehemiah Massed so a page was sent to him  No new orders received from dr. Posey Pronto, in report care nurse reported patient received vitamin k today

## 2018-08-05 NOTE — Progress Notes (Signed)
CRITICAL VALUE ALERT  Critical Value:  25  Date & Time Notied:  08/05/18 1045  Provider Notified: Dr. Posey Pronto  Orders Received/Actions taken:

## 2018-08-05 NOTE — Progress Notes (Addendum)
Hematology/Oncology Consult note [facility]  [phone] [phone]  Patient Care Team: Albina Billet, MD as PCP - General (Internal Medicine)   Name of the patient: Dakota Bowen  [id number]  06-Aug-1980   Date of visit:08/05/2018   Interval history- reports that his nausea is better. Feels that his abdomen is still quite bloated and continues to have abdominal pain   Review of systems- Review of Systems  Constitutional: Positive for malaise/fatigue. Negative for chills, fever and weight loss.  HENT: Negative for congestion, ear discharge and nosebleeds.   Eyes: Negative for blurred vision.  Respiratory: Negative for cough, hemoptysis, sputum production, shortness of breath and wheezing.   Cardiovascular: Negative for chest pain, palpitations, orthopnea and claudication.  Gastrointestinal: Positive for abdominal pain. Negative for blood in stool, constipation, diarrhea, heartburn, melena, nausea and vomiting.  Genitourinary: Negative for dysuria, flank pain, frequency, hematuria and urgency.  Musculoskeletal: Negative for back pain, joint pain and myalgias.  Skin: Negative for rash.  Neurological: Negative for dizziness, tingling, focal weakness, seizures, weakness and headaches.  Endo/Heme/Allergies: Does not bruise/bleed easily.  Psychiatric/Behavioral: Negative for depression and suicidal ideas. The patient does not have insomnia.       Allergies  Allergen Reactions   Penicillins Itching, Rash and Other (See Comments)   Past Medical History:  Diagnosis Date   Cyclical vomiting syndrome    GERD (gastroesophageal reflux disease)    GERD (gastroesophageal reflux disease)      Past Surgical History:  Procedure Laterality Date   CHOLECYSTECTOMY     ESOPHAGOGASTRODUODENOSCOPY (EGD) WITH PROPOFOL N/A 06/21/2014   Procedure: ESOPHAGOGASTRODUODENOSCOPY (EGD) WITH PROPOFOL. Looking in the esophagus stomach and upper small  intestine with a lighted tube to examine and treat.;  Surgeon: Lollie Sails, MD;  Location: [facility] ENDOSCOPY;  Service: Endoscopy;  Laterality: N/A;    Social History   Socioeconomic History   Marital status: Single    Spouse name: Not on file   Number of children: Not on file   Years of education: Not on file   Highest education level: Not on file  Occupational History   Not on file  Social Needs   Financial resource strain: Not on file   Food insecurity    Worry: Not on file    Inability: Not on file   Transportation needs    Medical: Not on file    Non-medical: Not on file  Tobacco Use   Smoking status: Current Every Day Smoker    Packs/day: 1.50   Smokeless tobacco: Never Used  Substance and Sexual Activity   Alcohol use: Yes    Comment: 3-4beers/day   Drug use: Yes    Types: Marijuana, Cocaine, Heroin   Sexual activity: Not on file  Lifestyle   Physical activity    Days per week: Not on file    Minutes per session: Not on file   Stress: Not on file  Relationships   Social connections    Talks on phone: Not on file    Gets together: Not on file    Attends religious service: Not on file    Active member of club or organization: Not on file    Attends meetings of clubs or organizations: Not on file    Relationship status: Not on file   Intimate partner violence    Fear of current or ex partner: Not on file    Emotionally abused: Not on file    Physically abused:  Not on file    Forced sexual activity: Not on file  Other Topics Concern   Not on file  Social History Narrative   Not on file    Family History  Problem Relation Age of Onset   Diabetes Mother    Cancer Mother    Diabetes Father      Current Facility-Administered Medications:    0.9 %  sodium chloride infusion, , Intravenous, Continuous, Fritzi Mandes, MD, Last Rate: 75 mL/hr at 08/05/18 0842   [facility] Hold] acetaminophen (TYLENOL) tablet 650 mg, 650 mg, Oral, Q6H PRN  **OR** [MAR Hold] acetaminophen (TYLENOL) suppository 650 mg, 650 mg, Rectal, Q6H PRN, Seals, Angela H, NP   butamben-tetracaine-benzocaine (CETACAINE) 02-17-12 % spray, , , ,    [MAR Hold] feeding supplement (BOOST / RESOURCE BREEZE) liquid 1 Container, 1 Container, Oral, TID BM, Fritzi Mandes, MD, 1 Container at 08/04/18 1434   fentaNYL (SUBLIMAZE) 100 MCG/2ML injection, , , ,    [MAR Hold] HYDROcodone-acetaminophen (NORCO/VICODIN) 5-325 MG per tablet 1 tablet, 1 tablet, Oral, Q4H PRN, Fritzi Mandes, MD, 1 tablet at 08/05/18 0332   lidocaine (XYLOCAINE) 2 % viscous mouth solution, , , ,    midazolam (VERSED) 5 MG/5ML injection, , , ,    [MAR Hold] morphine 2 MG/ML injection 2 mg, 2 mg, Intravenous, Q3H PRN, Fritzi Mandes, MD, 2 mg at 08/05/18 0842   [MAR Hold] multivitamin with minerals tablet 1 tablet, 1 tablet, Oral, Daily, Fritzi Mandes, MD   [facility] Hold] nicotine (NICODERM CQ - dosed in mg/24 hours) patch 21 mg, 21 mg, Transdermal, Daily, Fritzi Mandes, MD, 21 mg at 08/04/18 2022   [DISCONTINUED] ondansetron (ZOFRAN) tablet 4 mg, 4 mg, Oral, Q6H PRN **OR** [MAR Hold] ondansetron (ZOFRAN) injection 4 mg, 4 mg, Intravenous, Q4H PRN, Fritzi Mandes, MD, 4 mg at 08/04/18 1830   [MAR Hold] pantoprazole (PROTONIX) injection 40 mg, 40 mg, Intravenous, Q12H, Seals, Angela H, NP, 40 mg at 08/05/18 0842   [MAR Hold] phytonadione (VITAMIN K) SQ injection 10 mg, 10 mg, Subcutaneous, Daily, Jonathon Bellows, MD, 10 mg at 08/05/18 0842   [MAR Hold] polyethylene glycol (MIRALAX / GLYCOLAX) packet 17 g, 17 g, Oral, Daily PRN, Mayer Camel, NP  Physical exam:  Vitals:   08/04/18 1212 08/04/18 2040 08/05/18 0327 08/05/18 1126  BP: 111/75 111/83 108/77 111/78  Pulse: 74 77 68 83  Resp: _0 Temp: (!) 97.5 F (36.4 C) 97.8 F (36.6 C) 97.7 F (36.5 C) 98.2 F (36.8 C)  TempSrc: Oral Oral Oral Oral  SpO2: 98% 96% 96% 96%  Weight:      Height:       Physical Exam HENT:     Head: Normocephalic and  atraumatic.  Eyes:     Pupils: Pupils are equal, round, and reactive to light.  Neck:     Musculoskeletal: Normal range of motion.  Cardiovascular:     Rate and Rhythm: Normal rate and regular rhythm.     Heart sounds: Normal heart sounds.  Pulmonary:     Effort: Pulmonary effort is normal.     Breath sounds: Normal breath sounds.  Abdominal:     Comments: Palpable hepatosplenomegaly. Firm distended.   Lymphadenopathy:     Comments: Palpable right axillary and right inguinal adenopathy  Skin:    General: Skin is warm and dry.  Neurological:     Mental Status: He is alert and oriented to person, place, and time.  CMP Latest Ref Rng & Units 08/05/2018  Glucose 70 - 99 mg/dL 69(L)  BUN 6 - 20 mg/dL 9  Creatinine 0.61 - 1.24 mg/dL 0.65  Sodium 135 - 145 mmol/L 137  Potassium 3.5 - 5.1 mmol/L 3.8  Chloride 98 - 111 mmol/L 105  CO2 22 - 32 mmol/L 22  Calcium 8.9 - 10.3 mg/dL 6.8(L)  Total Protein 6.5 - 8.1 g/dL 4.5(L)  Total Bilirubin 0.3 - 1.2 mg/dL 1.8(H)  Alkaline Phos 38 - 126 U/L 377(H)  AST 15 - 41 U/L 259(H)  ALT 0 - 44 U/L 96(H)   CBC Latest Ref Rng & Units 08/05/2018  WBC 4.0 - 10.5 K/uL 5.5  Hemoglobin 13.0 - 17.0 g/dL 13.7  Hematocrit 39.0 - 52.0 % 41.0  Platelets 150 - 400 K/uL 25(LL)    _0 @  Dg Chest 2 View  Result Date: 08/03/2018 CLINICAL DATA:  Two-day history of wheezing, nausea and vomiting. Current smoker. EXAM: CHEST - 2 VIEW COMPARISON:  01/24/2017 and earlier. FINDINGS: Cardiomediastinal silhouette unremarkable and unchanged. Mildly prominent bronchovascular markings and mild central peribronchial thickening, unchanged over multiple prior examinations. Lungs otherwise clear. No localized airspace consolidation. No pleural effusions. No pneumothorax. Normal pulmonary vascularity. Visualized bony thorax intact. IMPRESSION: Stable mild changes of chronic bronchitis and/or asthma. No acute cardiopulmonary disease. Electronically Signed   By: Evangeline Dakin M.D.   On: 08/03/2018 18:52   Ct Chest W Contrast  Result Date: 08/04/2018 CLINICAL DATA:  Nausea and upper abdominal pain. 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 EXAM: CT CHEST WITH CONTRAST TECHNIQUE: Multidetector CT imaging of the chest was performed during intravenous contrast administration. CONTRAST:  67m OMNIPAQUE IOHEXOL 300 MG/ML  SOLN COMPARISON:  CT scan of the abdomen and pelvis August 03, 2018 FINDINGS: Cardiovascular: The heart size is normal. The thoracic aorta is nonaneurysmal with no dissection or atherosclerotic changes. Central pulmonary arteries are normal in caliber. No coronary artery calcifications are identified. Mediastinum/Nodes: Adenopathy in the axilla, right greater than left. A representative node in the right axilla on series 2, image 40 measures 2.1 cm. Retro pectoral adenopathy is seen on the right is well. No retropectoral adenopathy on the left. There is a mildly prominent epicardial node on series 2, image 120 measuring 8 mm, not definitely abnormal. A retrocrural node measures 11 mm on series 2, image 75, borderline. A few other shotty nodes are seen in the mediastinum without gross mediastinal adenopathy. No hilar adenopathy is identified. Lungs/Pleura: Central airways are normal. Paraseptal emphysematous changes in the apices, right greater than left. No pneumothorax. No suspicious nodules or masses. Dependent atelectasis is seen in the bases. There is a tiny left pleural effusion. Upper Abdomen: Splenomegaly is again identified. The patient's known splenic infarcts are not well assessed without contrast today. No other abnormalities in the upper abdomen. Musculoskeletal: No chest wall abnormality. No acute or significant osseous findings. IMPRESSION: 1. There is adenopathy in the bilateral axilla, right much greater than left. There is also retropectoral adenopathy on the right. Neoplastic and reactive/infectious/inflammatory causes are possible.  One of the right axillary nodes should be amenable to ultrasound-guided biopsy if clinically warranted. If biopsy is not pursued, recommend close follow-up. 2. Splenomegaly. The known splenic infarcts are not well assessed on this unenhanced study. 3. Mild paraseptal emphysematous changes in the apices. Emphysema (ICD10-J43.9). Electronically Signed   By: DDorise BullionIII M.D   On: 08/04/2018 13:40   Ct Abdomen Pelvis W Contrast  Result  Date: 08/03/2018 CLINICAL DATA:  38 year old male with abdominal pain and vomiting. EXAM: CT ABDOMEN AND PELVIS WITH CONTRAST TECHNIQUE: Multidetector CT imaging of the abdomen and pelvis was performed using the standard protocol following bolus administration of intravenous contrast. CONTRAST:  124m OMNIPAQUE IOHEXOL 300 MG/ML  SOLN COMPARISON:  CT of the abdomen pelvis dated 09/13/2016 FINDINGS: Lower chest: The visualized lung bases are clear. No intra-abdominal free air. There is a small free fluid within the pelvis. Hepatobiliary: The liver is enlarged measuring approximately 20 cm in midclavicular length. There is diffuse fatty infiltration of the liver. No intrahepatic biliary ductal dilatation. Cholecystectomy. No retained calcified stone identified in the central CBD. Pancreas: Unremarkable. No pancreatic ductal dilatation or surrounding inflammatory changes. Spleen: The spleen is enlarged measuring approximately 20 cm in craniocaudal length and 22 cm in AP length. 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 Adrenals/Urinary Tract: The adrenal glands are unremarkable. There is no hydronephrosis on either side. The visualized ureters and urinary bladder appear unremarkable. Stomach/Bowel: There is no bowel obstruction or active inflammation. The appendix is normal. Vascular/Lymphatic: The abdominal aorta and IVC are unremarkable. No portal venous gas. There is no adenopathy. Reproductive: The prostate  and seminal vesicles are grossly unremarkable. No pelvic mass. Other: There is paucity of subcutaneous fat. There is diffuse subcutaneous edema. Musculoskeletal: No acute or significant osseous findings. IMPRESSION: 1. Hepatosplenomegaly with diffuse fatty infiltration of the liver. Significant enlargement of the spleen, new since the prior CT with multiple areas of splenic infarct. 2. No bowel obstruction or active inflammation. Normal appendix. 3. Small free fluid within the pelvis and mild diffuse subcutaneous edema. Electronically Signed   By: AAnner CreteM.D.   On: 08/03/2018 20:12   [place] Biopsy (lymph Nodes)  Result Date: 08/05/2018 INDICATION: Lymphadenopathy, particularly in the right axillary region. Hepatosplenomegaly and splenic infarcts. EXAM: ULTRASOUND GUIDED CORE BIOPSY OF RIGHT AXILLARY LYMPH NODE MEDICATIONS: None. ANESTHESIA/SEDATION: None PROCEDURE: The procedure, risks, benefits, and alternatives were explained to the patient. Questions regarding the procedure were encouraged and answered. The patient understands and consents to the procedure. A time-out was performed prior to initiating the procedure. The right axilla was prepped with chlorhexidine in a sterile fashion, and a sterile drape was applied covering the operative field. A sterile gown and sterile gloves were used for the procedure. Local anesthesia was provided with 1% Lidocaine. Ultrasound was performed to localize right axillary lymph nodes. After choosing a lymph node for biopsy, a 16 gauge core biopsy device was utilized in obtaining 4 separate core biopsy samples of an enlarged lymph node. Core biopsy samples were submitted on Telfa gauze soaked with sterile saline. COMPLICATIONS: None immediate. FINDINGS: There are multiple enlarged right axillary lymph nodes. The largest measures approximately 3.5 x 1.9 x 1.8 cm. Solid tissue was obtained from this lymph node. IMPRESSION: Ultrasound-guided core biopsy performed of  an enlarged right axillary lymph node measuring 3.5 cm in greatest dimension. Electronically Signed   By: GAletta EdouardM.D.   On: 08/05/2018 11:40     Assessment and plan- Patient is a 38y.o. male with history of alcohol and IV drug abuse admitted for worsening abdominal pain and found to have significant hepatosplenomegaly, thrombocytopenia and splenic infarcts  1.  Splenic infarcts: Etiology remains unclear.  It could be autoinfarcts un the setting of massive splenomegaly. Question why did he develop severe hepatosplenomegaly.  Infectious work-up is currently pending.  HIV antibody as well  as hepatitis B and C was negative.  TEE did not show any vegetations. US doppler abdomen showed no hepatic, splenic or portal venous thrombosis.  Hypercoagulable work-up as well as Jak 2 and BCR ABL testing is currently pending.  Anticardiolipin antibodies negative. Vasculitis panel unremarkable. Patient was started on heparin drip for splenic infarct yesterday.  However his platelet count is 25 today and therefore his heparin drip will need to be on hold.  If platelet counts are consistently more than 25 heparin drip can be restarted at half the dose and full dose when platelets eventually go more than 50.  I will also order serum fungal antibody, fungitell and urine histoplasma antigen. Heptoglobin, retic count and ferritin ordered.   If patient complains of worsening abdominal pain- please have a low threshold for reimaging his spleen to rule out splenic rupture which would need emergency splenectomy. If bone marrow biopsy does not give [place] answers (see below)- we will have to consider splenectomy  I have also spoken to Dr. Peyton Najjar to assess if surgically he thinks there is a concern for impending rupture and if a splenectomy is warranted from a surgical standpoint at this time until further work up is completed.   2.  Thrombocytopenia: Suspect this is secondary to splenic sequestration in the setting of  splenic infarction and splenomegaly.  Given significant thrombocytopenia and massive hepatosplenomegaly, I will order IR guided Bone marrow biopsy tomorrow if possible. Patient to remain NPO after midnight today.  Patient did have significant right axillary adenopathy and underwent ultrasound-guided core biopsy today.  I will hold off on giving empiric steroids at this time for his thrombocytopenia.  PT PTT INR remains elevated and fibrinogen level done yesterday was low at 117.  Thrombocytopenia may be also secondary to DIC.  Hold off on platelet transfusion unless platelet counts are less than 10 or there is evidence of clinical bleeding.  If there is bleeding please check fibrinogen levels.  If they are less than 50-give cryoprecipitate if there are more than 50 give FFP along with platelet transfusion.  Continue to monitor LDH daily.  Smear review yesterday and today did not show any evidence of schistocytes   Visit Diagnosis 1. Intractable vomiting with nausea, unspecified vomiting type   2. Dehydration   3. Pain of upper abdomen   4. Splenic infarction   5. Abnormal LFTs   6. Lymphadenopathy, axillary      Dr. Randa Evens, MD, MPH [facility] [phone] 08/05/2018 4:05 PM

## 2018-08-05 NOTE — Progress Notes (Signed)
Notified dr.mansy of pt platelet count of 25. Acknowledged and said he had to talk to pharmacist. Orders to be placed. Cont to monitor.

## 2018-08-05 NOTE — CV Procedure (Signed)
Transesophageal echocardiogram preliminary report  Dakota Bowen [id number] 28-Dec-1980  Preliminary diagnosis  possible source of embolus  Postprocedural diagnosis  Normal LV systolic function no apparent source of embolus  Time out A timeout was performed by the nursing staff and physicians specifically identifying the procedure performed, identification of the patient, the type of sedation, all allergies and medications, all pertinent medical history, and presedation assessment of nasopharynx. The patient and or family understand the risks of the procedure including the rare risks of death, stroke, heart attack, esophogeal perforation, sore throat, and reaction to medications given.  Moderate sedation During this procedure the patient has received Versed 5 milligrams and fentanyl 100 micrograms to achieve appropriate moderate sedation.  The patient had continued monitoring of heart rate, oxygenation, blood pressure, respiratory rate, and extent of signs of sedation throughout the entire procedure.  The patient received this moderate sedation over a period of 18 minutes.  Both the nursing staff and I were present during the procedure when the patient had moderate sedation for 100% of the time.  Treatment considerations  No further cardiac intervention due to no apparent cardiac source of embolus  For further details of transesophageal echocardiogram please refer to final report.  Signed,  Corey Skains M.D. [facility] 08/05/2018 1:03 PM

## 2018-08-05 NOTE — Progress Notes (Signed)
*  PRELIMINARY RESULTS* Echocardiogram Echocardiogram Transesophageal has been performed.  Sherrie Sport 08/05/2018, 1:20 PM

## 2018-08-05 NOTE — Progress Notes (Signed)
  I came into to see the patient a few times today - he was away at procedures.   Noted platelet count dropping further, he does not have features of portal hypertension on CT scan - more likely non cirrhosis related, in addition it is acute and worsening .   On vitamin K- if the INR were to respond should do so by tomorrow otherwise need to consider other causes such as DIC  The key to help with the diagnosis would be the results of the lymph node biopsy and possible bone marrow biopsy.   I will see him tomorrow      LOS: 1 day   Jonathon Bellows, MD 08/05/2018, 5:37 PM

## 2018-08-05 NOTE — Progress Notes (Signed)
Notified Dr. Posey Pronto of low urine output.  Will restart fluids per Dr. Posey Pronto.

## 2018-08-05 NOTE — Progress Notes (Signed)
ANTICOAGULATION CONSULT NOTE - Initial Consult  Pharmacy Consult for Heparin drip Indication: splenic infarcts   Patient Measurements: Height: 6' (182.9 cm) Weight: 150 lb (68 kg) IBW/kg (Calculated) : 77.6 Heparin Dosing Weight: 68 kg  Vital Signs: Temp: 97.7 F (36.5 C) (07/21 0327) Temp Source: Oral (07/21 0327) BP: 108/77 (07/21 0327) Pulse Rate: 68 (07/21 0327)  Labs: Recent Labs    08/03/18 1655 08/03/18 1824 08/03/18 2218 08/04/18 0244 08/04/18 2050 08/05/18 0355  HGB 16.1  --   --  16.2  --  12.8*  HCT 47.4  --   --  48.9  --  39.1  PLT 44*  --   --  39*  --  27*  APTT  --   --  40*  --   --   --   LABPROT  --   --  20.0* 19.6*  --   --   INR  --   --  1.7* 1.7*  --   --   HEPARINUNFRC  --   --   --   --  <0.10* <0.10*  CREATININE 0.81  --   --  0.82  --   --   CKTOTAL  --  79  --   --   --   --     Estimated Creatinine Clearance: 115.2 mL/min (by C-G formula based on SCr of 0.82 mg/dL).   Medical History: Past Medical History:  Diagnosis Date  . Cyclical vomiting syndrome   . GERD (gastroesophageal reflux disease)   . GERD (gastroesophageal reflux disease)     Medications:  Scheduled:  . feeding supplement  1 Container Oral TID BM  . multivitamin with minerals  1 tablet Oral Daily  . nicotine  21 mg Transdermal Daily  . pantoprazole (PROTONIX) IV  40 mg Intravenous Q12H  . phytonadione  10 mg Subcutaneous Daily   Infusions:  . sodium chloride 125 mL/hr at 08/05/18 0325  . heparin 900 Units/hr (08/04/18 2158)    Assessment: 38 yo male to start Heparin drip for splenic infarcts. Abnormal liver fx tests. Hx ETOH, marijuana, IV drug use. Hgb 16.2  Plt 39>>27  INR 3.7 aptt 40  Oncology recommends starting Heparin drip at 1/2 the dose instead of full dose given his thrombocytopenia-monitor Plt  7/20 Heparin infusion started @ 650 units/hr 7/20:  HL @ 2050 = < 0.1. heparin infusion increased to 900 units/hr   Goal of Therapy:  Heparin level  0.3-0.7 units/ml Monitor platelets by anticoagulation protocol: Yes   Plan:  7/21@ 0355 HL: <0.10. Level is still subtherapeutic.  Will increase heparin infusion to 1100 units/hr Patient scheduled for biopsy today. Heparin infusion to stop @ 0600 per hand off.  Will need to F/U on heparin infusion restart and order heparin level.   Continue to monitor H&H and platelets   Pernell Dupre, PharmD, BCPS Clinical Pharmacist 08/05/2018 4:51 AM

## 2018-08-05 NOTE — Procedures (Signed)
Interventional Radiology Procedure Note  Procedure: US Guided Biopsy of right axillary lymph node  Complications: None  Estimated Blood Loss: < 10 mL  Findings: 16 G core biopsy of 3.5 cm right axillary lymph node performed under US guidance.  Four core samples obtained and sent to Pathology. Procedure performed without sedation and just with local anesthetic due to need to use sedation for TEE to immediately follow biopsy. Patient tolerated biopsy extremely well.  Venetia Night. Kathlene Cote, M.D Pager:  [phone]

## 2018-08-06 ENCOUNTER — Inpatient Hospital Stay: Payer: Medicaid Other

## 2018-08-06 ENCOUNTER — Other Ambulatory Visit (HOSPITAL_COMMUNITY)
Admission: RE | Admit: 2018-08-06 | Disposition: A | Discharge status: Home / Self Care | Payer: Self-pay | Source: Ambulatory Visit | Attending: Oncology | Admitting: Oncology

## 2018-08-06 ENCOUNTER — Encounter: Payer: Self-pay | Admitting: Radiology

## 2018-08-06 LAB — RSV(RESPIRATORY SYNCYTIAL VIRUS) AB, BLOOD: RSV Ab: 1:32 {titer} — ABNORMAL HIGH

## 2018-08-06 LAB — HSV(HERPES SIMPLEX VRS) I + II AB-IGM: HSVI/II Comb IgM: <0.91 Ratio (ref 0.00–0.90)

## 2018-08-06 LAB — COMPREHENSIVE METABOLIC PANEL
ALT: 108 U/L — ABNORMAL HIGH (ref 0–44)
AST: 291 U/L — ABNORMAL HIGH (ref 15–41)
Albumin: 2.8 g/dL — ABNORMAL LOW (ref 3.5–5.0)
Alkaline Phosphatase: 450 U/L — ABNORMAL HIGH (ref 38–126)
Anion gap: 11 (ref 5–15)
BUN: 9 mg/dL (ref 6–20)
CO2: 23 mmol/L (ref 22–32)
Calcium: 6.8 mg/dL — ABNORMAL LOW (ref 8.9–10.3)
Chloride: 101 mmol/L (ref 98–111)
Creatinine, Ser: 0.67 mg/dL (ref 0.61–1.24)
GFR calc Af Amer: >60 mL/min (ref >60)
GFR calc non Af Amer: >60 mL/min (ref >60)
Glucose, Bld: 73 mg/dL (ref 70–99)
Potassium: 3.6 mmol/L (ref 3.5–5.1)
Sodium: 135 mmol/L (ref 135–145)
Total Bilirubin: 2.2 mg/dL — ABNORMAL HIGH (ref 0.3–1.2)
Total Protein: 4.7 g/dL — ABNORMAL LOW (ref 6.5–8.1)

## 2018-08-06 LAB — PROTIME-INR
INR: 1.9 — ABNORMAL HIGH (ref 0.8–1.2)
Prothrombin Time: 21.5 seconds — ABNORMAL HIGH (ref 11.4–15.2)

## 2018-08-06 LAB — BETA-2-GLYCOPROTEIN I ABS, IGG/M/A
Beta-2 Glyco I IgG: <9 GPI IgG units (ref 0–20)
Beta-2-Glycoprotein I IgA: <9 GPI IgA units (ref 0–25)
Beta-2-Glycoprotein I IgM: <9 GPI IgM units (ref 0–32)

## 2018-08-06 LAB — FIBRINOGEN: Fibrinogen: 133 mg/dL — ABNORMAL LOW (ref 210–475)

## 2018-08-06 LAB — HEX PHASE PHOSPHOLIPID REFLEX

## 2018-08-06 LAB — IRON AND TIBC
Iron: 42 ug/dL — ABNORMAL LOW (ref 45–182)
Saturation Ratios: 22 % (ref 17.9–39.5)
TIBC: 192 ug/dL — ABNORMAL LOW (ref 250–450)
UIBC: 150 ug/dL

## 2018-08-06 LAB — HAPTOGLOBIN: Haptoglobin: <10 mg/dL — ABNORMAL LOW (ref 17–317)

## 2018-08-06 LAB — HEXAGONAL PHASE PHOSPHOLIPID: Hex Phosph Neut Test: 0 s (ref 0–11)

## 2018-08-06 LAB — FERRITIN: Ferritin: 530 ng/mL — ABNORMAL HIGH (ref 24–336)

## 2018-08-06 MED ORDER — MIDAZOLAM HCL 5 MG/5ML IJ SOLN
INTRAMUSCULAR | Status: AC
  Administered 2018-08-06: 09:00:00
  Filled 2018-08-06: qty 5

## 2018-08-06 MED ORDER — PANTOPRAZOLE SODIUM 40 MG PO TBEC
40.0000 mg | DELAYED_RELEASE_TABLET | Freq: Two times a day (BID) | ORAL | Status: DC
  Administered 2018-08-06 – 2018-08-08 (×5): 40 mg via ORAL
  Filled 2018-08-06 (×5): qty 1

## 2018-08-06 MED ORDER — PNEUMOCOCCAL 13-VAL CONJ VACC IM SUSP
0.5000 mL | INTRAMUSCULAR | Status: AC
  Administered 2018-08-06: 0.5 mL via INTRAMUSCULAR
  Filled 2018-08-06: qty 0.5

## 2018-08-06 MED ORDER — FUROSEMIDE 10 MG/ML IJ SOLN: 40.0000 mg | Freq: Two times a day (BID) | INTRAMUSCULAR | Status: DC

## 2018-08-06 MED ORDER — IOHEXOL 300 MG/ML  SOLN
100.0000 mL | Freq: Once | INTRAMUSCULAR | Status: AC | PRN
  Administered 2018-08-06: 100 mL via INTRAVENOUS

## 2018-08-06 MED ORDER — FUROSEMIDE 10 MG/ML IJ SOLN
40.0000 mg | Freq: Once | INTRAMUSCULAR | Status: AC
  Administered 2018-08-06: 40 mg via INTRAVENOUS

## 2018-08-06 MED ORDER — FUROSEMIDE 10 MG/ML IJ SOLN
INTRAMUSCULAR | Status: AC
  Filled 2018-08-06: qty 4

## 2018-08-06 MED ORDER — FENTANYL CITRATE (PF) 100 MCG/2ML IJ SOLN
INTRAMUSCULAR | Status: AC | PRN
  Administered 2018-08-06: 50 ug via INTRAVENOUS
  Administered 2018-08-06: 25 ug via INTRAVENOUS

## 2018-08-06 MED ORDER — HAEMOPHILUS B POLYSAC CONJ VAC 10 MCG IJ SOLR
0.5000 mL | Freq: Once | INTRAMUSCULAR | Status: DC
  Filled 2018-08-06: qty 0.5

## 2018-08-06 MED ORDER — HAEMOPHILUS B POLYSAC CONJ VAC IM SOLR
0.5000 mL | Freq: Once | INTRAMUSCULAR | Status: AC
  Administered 2018-08-06: 0.5 mL via INTRAMUSCULAR
  Filled 2018-08-06: qty 0.5

## 2018-08-06 MED ORDER — FENTANYL CITRATE (PF) 100 MCG/2ML IJ SOLN
INTRAMUSCULAR | Status: AC
  Administered 2018-08-06: 09:00:00
  Filled 2018-08-06: qty 2

## 2018-08-06 MED ORDER — MIDAZOLAM HCL 5 MG/5ML IJ SOLN
INTRAMUSCULAR | Status: AC | PRN
  Administered 2018-08-06 (×3): 1 mg via INTRAVENOUS

## 2018-08-06 MED ORDER — HEPARIN SOD (PORK) LOCK FLUSH 100 UNIT/ML IV SOLN
INTRAVENOUS | Status: AC
  Administered 2018-08-06: 10:00:00
  Filled 2018-08-06: qty 5

## 2018-08-06 MED ORDER — ENOXAPARIN SODIUM 40 MG/0.4ML ~~LOC~~ SOLN
40.0000 mg | SUBCUTANEOUS | Status: DC
  Administered 2018-08-06: 40 mg via SUBCUTANEOUS
  Filled 2018-08-06 (×2): qty 0.4

## 2018-08-06 MED ORDER — MENINGOCOCCAL A C Y&W-135 OLIG IM SOLR
0.5000 mL | Freq: Once | INTRAMUSCULAR | Status: AC
  Administered 2018-08-06: 0.5 mL via INTRAMUSCULAR
  Filled 2018-08-06: qty 0.5

## 2018-08-06 NOTE — Progress Notes (Signed)
Dakota Bowen   DOB:[birth year]   OI#:[id number]    Subjective: Patient had a difficult night-given the worsening pain in the abdomen.  Pain is mostly in the right side abdomen.  Positive for nausea no vomiting.  No fevers or chills.  Appetite is poor.   Patient had ultrasound-guided biopsy of the right axillary lymph node yesterday.  Is awaiting a bone marrow biopsy today.  Objective:  Vitals:   08/06/18 1113 08/06/18 1205  BP: 106/87 103/73  Pulse: 98 99  Resp: 16 16  Temp: 98.1 F (36.7 C) 98.7 F (37.1 C)  SpO2: 98% 97%     Intake/Output Summary (Last 24 hours) at 08/06/2018 1652 Last data filed at 08/06/2018 1349 Gross per 24 hour  Intake 684.18 ml  Output 3025 ml  Net -2340.82 ml    Physical Exam  Constitutional: He is oriented to person, place, and time.  Thin built Caucasian male patient.  HENT:  Head: Normocephalic and atraumatic.  Mouth/Throat: Oropharynx is clear and moist. No oropharyngeal exudate.  Eyes: Pupils are equal, round, and reactive to light.  Neck: Normal range of motion. Neck supple.  Cardiovascular: Normal rate and regular rhythm.  Pulmonary/Chest: Effort normal and breath sounds normal. No respiratory distress. He has no wheezes.  Positive for axial adenopathy bilateral right more than left.  Mild adenopathy in the right inguinal region.  Abdominal: Soft. Bowel sounds are normal. He exhibits no distension and no mass. There is abdominal tenderness. There is no rebound and no guarding.  Musculoskeletal: Normal range of motion.        General: No tenderness or edema.  Neurological: He is alert and oriented to person, place, and time.  Skin: Skin is warm.  Psychiatric: Affect normal.     Labs:  Lab Results  Component Value Date   WBC 6.3 08/06/2018   HGB 14.3 08/06/2018   HCT 42.6 08/06/2018   MCV 90.4 08/06/2018   PLT 23 (LL) 08/06/2018   NEUTROABS 4.9 08/06/2018    Lab Results  Component Value Date   NA 135 08/06/2018   K 3.6  08/06/2018   CL 101 08/06/2018   CO2 23 08/06/2018    Studies:  Ct Abdomen Pelvis W Contrast  Result Date: 08/06/2018 CLINICAL DATA:  38 year old male with hepatosplenomegaly and recent biopsy of enlarged axillary lymph nodes. Patient continues to have abdominal pain with distended abdomen. EXAM: CT ABDOMEN AND PELVIS WITH CONTRAST TECHNIQUE: Multidetector CT imaging of the abdomen and pelvis was performed using the standard protocol following bolus administration of intravenous contrast. CONTRAST:  157m OMNIPAQUE IOHEXOL 300 MG/ML  SOLN COMPARISON:  CT of the abdomen pelvis dated 08/03/2018 FINDINGS: Lower chest: There are small bilateral pleural effusions, new since the prior CT. Minimal bibasilar dependent atelectatic changes noted. No intra-abdominal free air. Diffuse mesenteric edema and small ascites, increase in size since the prior CT. Hepatobiliary: The liver is enlarged measuring approximately 22 cm in midclavicular length. There is slight heterogeneous enhancement of the liver. No intrahepatic biliary ductal dilatation. Cholecystectomy. Pancreas: Unremarkable. No pancreatic ductal dilatation or surrounding inflammatory changes. Spleen: The spleen is enlarged with multiple areas of infarct as seen on the prior CT. The spleen measures approximately 22 cm in anterior-posterior dimension and 20 cm in craniocaudal length similar to prior CT. Adrenals/Urinary Tract: The adrenal glands are unremarkable. There is no hydronephrosis on either side head the symmetric enhancement and excretion of contrast by both kidneys. 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 The urinary bladder is  distended and grossly unremarkable. Stomach/Bowel: Thickened and edematous appearance of the ascending and proximal transverse colon may be related to ascites. Colitis is considered less likely. Clinical correlation is recommended. There is no bowel obstruction. The appendix is normal. Vascular/Lymphatic: The  abdominal aorta and IVC are unremarkable. No portal venous gas. There is no adenopathy. Reproductive: The prostate and seminal vesicles are grossly unremarkable. No pelvic mass. Other: Diffuse subcutaneous edema and anasarca new since the prior CT. Musculoskeletal: No acute or significant osseous findings. IMPRESSION: 1. Hepatosplenomegaly with multiple areas of splenic infarct as seen on the prior CT. 2. Small bilateral pleural effusions, small ascites, diffuse mesenteric edema and anasarca new since the prior CT. 3. Thickened and edematous appearance of the ascending and proximal transverse colon may be related to ascites. Colitis is considered less likely. Clinical correlation is recommended. No bowel obstruction. Normal appendix. Electronically Signed   By: Anner Crete M.D.   On: 08/06/2018 00:54   Ct Bone Marrow Biopsy & Aspiration  Result Date: 08/06/2018 CLINICAL DATA:  Lymphadenopathy and hepatosplenomegaly. Bone marrow biopsy required for further workup. EXAM: CT GUIDED BONE MARROW ASPIRATION AND BIOPSY ANESTHESIA/SEDATION: Versed 3.0 mg IV, Fentanyl 75 mcg IV Total Moderate Sedation Time:   12 minutes. The patient's level of consciousness and physiologic status were continuously monitored during the procedure by Radiology nursing. PROCEDURE: The procedure risks, benefits, and alternatives were explained to the patient. Questions regarding the procedure were encouraged and answered. The patient understands and consents to the procedure. A time out was performed prior to initiating the procedure. The right gluteal region was prepped with chlorhexidine. Sterile gown and sterile gloves were used for the procedure. Local anesthesia was provided with 1% Lidocaine. Under CT guidance, an 11 gauge On Control bone cutting needle was advanced from a posterior approach into the right iliac bone. Needle positioning was confirmed with CT. Initial non heparinized and heparinized aspirate samples were obtained of  bone marrow. Core biopsy was performed via the On Control drill needle. COMPLICATIONS: None FINDINGS: Inspection of initial aspirate did reveal visible particles. Intact core biopsy sample was obtained. IMPRESSION: CT guided bone marrow biopsy of right posterior iliac bone with both aspirate and core samples obtained. Electronically Signed   By: Aletta Edouard M.D.   On: 08/06/2018 12:01   US Liver Doppler  Result Date: 08/05/2018 CLINICAL DATA:  Abnormal liver function tests. EXAM: DUPLEX ULTRASOUND OF LIVER TECHNIQUE: Color and duplex Doppler ultrasound was performed to evaluate the hepatic in-flow and out-flow vessels. COMPARISON:  CT scan of August 03, 2018. FINDINGS: Liver: Increased echogenicity of hepatic parenchyma is noted. Normal hepatic contour without nodularity. No focal lesion, mass or intrahepatic biliary ductal dilatation. Main Portal Vein size: 1.73 cm Portal Vein Velocities Main Prox:  41.1 cm/sec Main Mid: 28.3 cm/sec Main Dist:  35.6 cm/sec Right: 21.4 cm/sec Left: 24.9 cm/sec Normal hepatopetal flow is noted in the portal veins. Hepatic Vein Velocities Right:  41.1 cm/sec Middle:  40.6 cm/sec Left:  34.9 cm/sec Normal hepatofugal flow is noted in the hepatic vein. IVC: Present and patent with normal respiratory phasicity. Hepatic Artery Velocity:  103.2 cm/sec Splenic Vein Velocity:  22 cm/sec Spleen: 16.25 cm x 20.7 cm x 10.63 cm with a total volume of 1872 cm^3 (411 cm^3 is upper limit normal) Portal Vein Occlusion/Thrombus: No Splenic Vein Occlusion/Thrombus: No Ascites: None Varices: None IMPRESSION: No evidence of portal, hepatic or splenic venous thrombosis or occlusion. Increased echogenicity of hepatic parenchyma is noted suggesting hepatic steatosis. Severe  splenomegaly is noted. Electronically Signed   By: Marijo Conception M.D.   On: 08/05/2018 12:13   [place] Core Biopsy (lymph Nodes)  Result Date: 08/05/2018 INDICATION: Lymphadenopathy, particularly in the right axillary region.  Hepatosplenomegaly and splenic infarcts. EXAM: ULTRASOUND GUIDED CORE BIOPSY OF RIGHT AXILLARY LYMPH NODE MEDICATIONS: None. ANESTHESIA/SEDATION: None PROCEDURE: The procedure, risks, benefits, and alternatives were explained to the patient. Questions regarding the procedure were encouraged and answered. The patient understands and consents to the procedure. A time-out was performed prior to initiating the procedure. The right axilla was prepped with chlorhexidine in a sterile fashion, and a sterile drape was applied covering the operative field. A sterile gown and sterile gloves were used for the procedure. Local anesthesia was provided with 1% Lidocaine. Ultrasound was performed to localize right axillary lymph nodes. After choosing a lymph node for biopsy, a 16 gauge core biopsy device was utilized in obtaining 4 separate core biopsy samples of an enlarged lymph node. Core biopsy samples were submitted on Telfa gauze soaked with sterile saline. COMPLICATIONS: None immediate. FINDINGS: There are multiple enlarged right axillary lymph nodes. The largest measures approximately 3.5 x 1.9 x 1.8 cm. Solid tissue was obtained from this lymph node. IMPRESSION: Ultrasound-guided core biopsy performed of an enlarged right axillary lymph node measuring 3.5 cm in greatest dimension. Electronically Signed   By: Aletta Edouard M.D.   On: 08/05/2018 11:40    Abdominal pain #38 year old male patient is currently admitted to hospital for hepatosplenomegaly/abdominal pain axillary and thoracic adenopathy-highly concerning for malignancy.  #Hepatosplenomegaly/axial adenopathy-status post ultrasound-guided lymph node biopsy yesterday.  Awaiting results.  Given the concerns for lymphoma-patient awaiting bone marrow biopsy this afternoon.  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  Start patient on low-dose of Lovenox 40 mg subcu [low platelets below]. Agree that this needs to be monitored  closely as patient is at risk for splenic rupture.  #Thrombocytopenia-hepatosplenomegaly underlying possible malignancy-[splenic infarcts].  Lovenox 40 mg started/monitor closely.   #Above plan of care was discussed the patient.  Patient agreement.   Cammie Sickle, MD 08/06/2018  4:52 PM

## 2018-08-06 NOTE — Progress Notes (Signed)
Pt arrived to [facility] with RAD RN. Site assessed with moderate amount of blood on band aid. Band aid removed, and gauze pressure dressing placed. Mild amount of swelling around puncture site. Pt denies pain at site. Dr. Kathlene Cote notified, per MD to place pressure dressing.

## 2018-08-06 NOTE — Consult Note (Signed)
SURGICAL CONSULTATION NOTE   HISTORY OF PRESENT ILLNESS (HPI):  38 y.o. male presented to [facility] ED for evaluation of intractable vomiting since 5 days ago.  Patient has complicated history of recurrent episode of vomiting.  Patient not too combative during the evaluation due to pain.  Patient just had bone marrow biopsy today.  He has also been complaining about pain during the whole admission.  At this moment there is no nausea or vomiting.  There is no pain radiation.  Patient reports that pain is generalized in his abdomen.  There is no alleviating or aggravating factor.    At the ED CT scan of the abdomen was done showing severe splenomegaly.  The spleen has multiple infarct.  There is no appreciated mass.  There is no vascular blush.  There is no free air.  There is no free fluid.  There is no bowel pathology.  I personally evaluated the images.  Due to these findings patient was admitted by hospitalist for further work-up.  Surgery is consulted by Dr. Posey Pronto in this context for evaluation and management of splenomegaly.  PAST MEDICAL HISTORY (PMH):  Past Medical History:  Diagnosis Date  . Cyclical vomiting syndrome   . GERD (gastroesophageal reflux disease)   . GERD (gastroesophageal reflux disease)      PAST SURGICAL HISTORY ([place]):  Past Surgical History:  Procedure Laterality Date  . CHOLECYSTECTOMY    . ESOPHAGOGASTRODUODENOSCOPY (EGD) WITH PROPOFOL N/A 06/21/2014   Procedure: ESOPHAGOGASTRODUODENOSCOPY (EGD) WITH PROPOFOL. Looking in the esophagus stomach and upper small intestine with a lighted tube to examine and treat.;  Surgeon: Lollie Sails, MD;  Location: [facility] ENDOSCOPY;  Service: Endoscopy;  Laterality: N/A;  . TEE WITHOUT CARDIOVERSION N/A 08/05/2018   Procedure: TRANSESOPHAGEAL ECHOCARDIOGRAM (TEE);  Surgeon: Corey Skains, MD;  Location: [facility] ORS;  Service: Cardiovascular;  Laterality: N/A;     MEDICATIONS:  Prior to Admission medications   Not on File      ALLERGIES:  Penicillins   SOCIAL HISTORY:  Social History   Socioeconomic History  . Marital status: Single    Spouse name: Not on file  . Number of children: Not on file  . Years of education: Not on file  . Highest education level: Not on file  Occupational History  . Not on file  Social Needs  . Financial resource strain: Not on file  . Food insecurity    Worry: Not on file    Inability: Not on file  . Transportation needs    Medical: Not on file    Non-medical: Not on file  Tobacco Use  . Smoking status: Current Every Day Smoker    Packs/day: 1.50  . Smokeless tobacco: Never Used  Substance and Sexual Activity  . Alcohol use: Yes    Comment: 3-4beers/day  . Drug use: Yes    Types: Marijuana, Cocaine, Heroin  . Sexual activity: Not on file  Lifestyle  . Physical activity    Days per week: Not on file    Minutes per session: Not on file  . Stress: Not on file  Relationships  . Social Herbalist on phone: Not on file    Gets together: Not on file    Attends religious service: Not on file    Active member of club or organization: Not on file    Attends meetings of clubs or organizations: Not on file    Relationship status: Not on file  . Intimate partner violence  Fear of current or ex partner: Not on file    Emotionally abused: Not on file    Physically abused: Not on file    Forced sexual activity: Not on file  Other Topics Concern  . Not on file  Social History Narrative  . Not on file    The patient currently resides (home / rehab facility / nursing home): Home The patient normally is (ambulatory / bedbound): Ambulatory   FAMILY HISTORY:  Family History  Problem Relation Age of Onset  . Diabetes Mother   . Cancer Mother   . Diabetes Father      REVIEW OF SYSTEMS:  Constitutional: denies weight loss, fever, chills, or sweats  Eyes: denies any other vision changes, history of eye injury  ENT: denies sore throat, hearing problems   Respiratory: denies shortness of breath, wheezing  Cardiovascular: denies chest pain, palpitations  Gastrointestinal: Positive positive abdominal pain, N/V Genitourinary: denies burning with urination or urinary frequency Musculoskeletal: denies any other joint pains or cramps  Skin: denies any other rashes or skin discolorations  Neurological: denies any other headache, dizziness, weakness  Psychiatric: denies any other depression, anxiety   All other review of systems were negative   VITAL SIGNS:  Temp:  [97.7 F (36.5 C)-98.7 F (37.1 C)] 98.7 F (37.1 C) (07/22 1205) Pulse Rate:  [73-99] 99 (07/22 1205) Resp:  [14-21] 16 (07/22 1205) BP: (102-135)/(65-88) 103/73 (07/22 1205) SpO2:  [94 %-100 %] 97 % (07/22 1205)     Height: 6' (182.9 cm) Weight: 68 kg BMI (Calculated): 20.34   INTAKE/OUTPUT:  This shift: Total I/O In: -  Out: 250 [Emesis/NG output:250]  Last 2 shifts: @IOLAST2SHIFTS @   PHYSICAL EXAM:  Constitutional:  -- Normal body habitus  -- Awake, alert, and oriented x3  Eyes:  -- Pupils equally round and reactive to light  -- No scleral icterus  Ear, nose, and throat:  -- No jugular venous distension  Pulmonary:  -- No crackles  -- Equal breath sounds bilaterally -- Breathing non-labored at rest Cardiovascular:  -- S1, S2 present  -- No pericardial rubs Gastrointestinal:  -- Abdomen hard left side of the abdomen, tender to palpation, non-distended, no guarding or rebound tenderness -- No abdominal masses appreciated, pulsatile or otherwise  Musculoskeletal and Integumentary:  -- Wounds or skin discoloration: None appreciated -- Extremities: B/L UE and LE FROM, hands and feet warm, no edema  Neurologic:  -- Motor function: intact and symmetric -- Sensation: intact and symmetric   Labs:  CBC Latest Ref Rng & Units 08/06/2018 08/05/2018 08/05/2018  WBC 4.0 - 10.5 K/uL 6.3 5.5 5.6  Hemoglobin 13.0 - 17.0 g/dL 14.3 13.7 12.8(L)  Hematocrit 39.0 - 52.0 %  42.6 41.0 39.1  Platelets 150 - 400 K/uL 23(LL) 25(LL) 27(LL)   CMP Latest Ref Rng & Units 08/06/2018 08/05/2018 08/04/2018  Glucose 70 - 99 mg/dL 73 69(L) 86  BUN 6 - 20 mg/dL 9 9 18   Creatinine 0.61 - 1.24 mg/dL 0.67 0.65 0.82  Sodium 135 - 145 mmol/L 135 137 138  Potassium 3.5 - 5.1 mmol/L 3.6 3.8 3.5  Chloride 98 - 111 mmol/L 101 105 100  CO2 22 - 32 mmol/L 23 22 28   Calcium 8.9 - 10.3 mg/dL 6.8(L) 6.8(L) 7.2(L)  Total Protein 6.5 - 8.1 g/dL 4.7(L) 4.5(L) -  Total Bilirubin 0.3 - 1.2 mg/dL 2.2(H) 1.8(H) -  Alkaline Phos 38 - 126 U/L 450(H) 377(H) -  AST 15 - 41 U/L 291(H) 259(H) -  ALT 0 - 44 U/L 108(H) 96(H) -   Imaging studies:  EXAM: CT ABDOMEN AND PELVIS WITH CONTRAST  TECHNIQUE: Multidetector CT imaging of the abdomen and pelvis was performed using the standard protocol following bolus administration of intravenous contrast.  CONTRAST:  143m OMNIPAQUE IOHEXOL 300 MG/ML  SOLN  COMPARISON:  CT of the abdomen pelvis dated 09/13/2016  FINDINGS: Lower chest: The visualized lung bases are clear.  No intra-abdominal free air. There is a small free fluid within the pelvis.  Hepatobiliary: The liver is enlarged measuring approximately 20 cm in midclavicular length. There is diffuse fatty infiltration of the liver. No intrahepatic biliary ductal dilatation. Cholecystectomy. No retained calcified stone identified in the central CBD.  Pancreas: Unremarkable. No pancreatic ductal dilatation or surrounding inflammatory changes.  Spleen: The spleen is enlarged measuring approximately 20 cm in craniocaudal length and 22 cm in AP length. 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  Adrenals/Urinary Tract: The adrenal glands are unremarkable. There is no hydronephrosis on either side. The visualized ureters and urinary bladder appear unremarkable.  Stomach/Bowel: There is no bowel obstruction or  active inflammation. The appendix is normal.  Vascular/Lymphatic: The abdominal aorta and IVC are unremarkable. No portal venous gas. There is no adenopathy.  Reproductive: The prostate and seminal vesicles are grossly unremarkable. No pelvic mass.  Other: There is paucity of subcutaneous fat. There is diffuse subcutaneous edema.  Musculoskeletal: No acute or significant osseous findings.  IMPRESSION: 1. Hepatosplenomegaly with diffuse fatty infiltration of the liver. Significant enlargement of the spleen, new since the prior CT with multiple areas of splenic infarct. 2. No bowel obstruction or active inflammation. Normal appendix. 3. Small free fluid within the pelvis and mild diffuse subcutaneous edema.   Electronically Signed   By: AAnner CreteM.D.   On: 08/03/2018 20:12  Assessment/Plan:  38y.o. male with severe splenomegaly with splenic infarcts, complicated by pertinent comorbidities including dehydration, elevated liver enzymes.  Currently patient with unknown etiology of splenomegaly with splenic infarct.  Infectious and oncologic etiologies are being on the work-up.  There is also been work-up for hypercoagulability.  I agree with current work-up.  Today patient had bone marrow biopsy.  At this moment patient is stable and there is no acute abdomen and no need for urgent surgical management.  Will follow case and if patient needs splenectomy depending on the diagnosis, will discuss alternatives to plan for the surgery during this admission. This is a very complicated case due to the size of the spleen and currently there is no known etiology.  After getting all information we will evaluate patient the recommendation from surgical standpoint.  We will continue current management by primary team.  EArnold Long MD

## 2018-08-06 NOTE — Progress Notes (Signed)
Dakota Bowen , MD [address] [place], [place], [place], [address] [address], [place], [place], [place], [address] Phone: [phone]  Fax: [phone]   Dakota Bowen is being followed for abdominal pain , splenic infarcts  Day 2 of follow up   Subjective: Abdominal pain    Objective: Vital signs in last 24 hours: Vitals:   08/05/18 2202 08/05/18 2349 08/06/18 0202 08/06/18 0524  BP: 109/80 113/88 109/85 106/78  Pulse: 73 77 76 85  Resp: 20 20 16 14   Temp: 98.4 F (36.9 C) 97.7 F (36.5 C)  98.5 F (36.9 C)  TempSrc: Oral Oral  Oral  SpO2: 99% 99% 98% 97%  Weight:      Height:       Weight change:   Intake/Output Summary (Last 24 hours) at 08/06/2018 0848 Last data filed at 08/06/2018 9509 Gross per 24 hour  Intake 1427.37 ml  Output 2775 ml  Net -1347.63 ml     Exam: Heart:: Regular rate and rhythm, S1S2 present or without murmur or extra heart sounds Lungs: normal, clear to auscultation and clear to auscultation and percussion Abdomen: soft , non distended, generalized tenderness more pronounced LUQ, hepatosplenomegaly +, no guarding or rigidity , BS+   Lab Results: @LABTEST2 @ Micro Results: Recent Results (from the past 240 hour(s))  SARS Coronavirus 2 (CEPHEID- Performed in [facility] lab), Hosp Order     Status: None   Collection Time: 08/03/18  9:39 PM   Specimen: Nasopharyngeal Swab  Result Value Ref Range Status   SARS Coronavirus 2 NEGATIVE NEGATIVE Final    Comment: (NOTE) If result is NEGATIVE SARS-CoV-2 target nucleic acids are NOT DETECTED. The SARS-CoV-2 RNA is generally detectable in upper and lower  respiratory specimens during the acute phase of infection. The lowest  concentration of SARS-CoV-2 viral copies this assay can detect is 250  copies / mL. A negative result does not preclude SARS-CoV-2 infection  and should not be used as the sole basis for treatment or other  patient management decisions.  A negative  result may occur with  improper specimen collection / handling, submission of specimen other  than nasopharyngeal swab, presence of viral mutation(s) within the  areas targeted by this assay, and inadequate number of viral copies  (<250 copies / mL). A negative result must be combined with clinical  observations, patient history, and epidemiological information. If result is POSITIVE SARS-CoV-2 target nucleic acids are DETECTED. The SARS-CoV-2 RNA is generally detectable in upper and lower  respiratory specimens dur ing the acute phase of infection.  Positive  results are indicative of active infection with SARS-CoV-2.  Clinical  correlation with patient history and other diagnostic information is  necessary to determine patient infection status.  Positive results do  not rule out bacterial infection or co-infection with other viruses. If result is PRESUMPTIVE POSTIVE SARS-CoV-2 nucleic acids MAY BE PRESENT.   A presumptive positive result was obtained on the submitted specimen  and confirmed on repeat testing.  While 2019 novel coronavirus  (SARS-CoV-2) nucleic acids may be present in the submitted sample  additional confirmatory testing may be necessary for epidemiological  and / or clinical management purposes  to differentiate between  SARS-CoV-2 and other Sarbecovirus currently known to infect humans.  If clinically indicated additional testing with an alternate test  methodology [phone]) is advised. The SARS-CoV-2 RNA is generally  detectable in upper and lower respiratory sp ecimens during the acute  phase of infection. The  expected result is Negative. Fact Sheet for Patients:  StrictlyIdeas.no Fact Sheet for Healthcare Providers: BankingDealers.co.za This test is not yet approved or cleared by the [place] FDA and has been authorized for detection and/or diagnosis of SARS-CoV-2 by FDA under an Emergency Use Authorization  (EUA).  This EUA will remain in effect (meaning this test can be used) for the duration of the COVID-19 declaration under Section 564(b)(1) of the Act, 21 U.S.C. section 360bbb-3(b)(1), unless the authorization is terminated or revoked sooner. Performed at [facility], [address] [place], [place] [address]   Culture, blood (routine x 2)     Status: None (Preliminary result)   Collection Time: 08/03/18 10:33 PM   Specimen: BLOOD  Result Value Ref Range Status   Specimen Description BLOOD RIGHT ANTECUBITAL  Final   Special Requests   Final    BOTTLES DRAWN AEROBIC AND ANAEROBIC Blood Culture adequate volume   Culture   Final    NO GROWTH 3 DAYS Performed at [facility], [address] [place], [place] [address]    Report Status PENDING  Incomplete  Culture, blood (routine x 2)     Status: None (Preliminary result)   Collection Time: 08/03/18 10:33 PM   Specimen: BLOOD  Result Value Ref Range Status   Specimen Description BLOOD LEFT HAND  Final   Special Requests   Final    BOTTLES DRAWN AEROBIC AND ANAEROBIC Blood Culture results may not be optimal due to an inadequate volume of blood received in culture bottles   Culture   Final    NO GROWTH 3 DAYS Performed at [facility], [address], [place], [place] [address]    Report Status PENDING  Incomplete   Studies/Results: Ct Chest W Contrast  Result Date: 08/04/2018 CLINICAL DATA:  Nausea and upper abdominal pain. 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 EXAM: CT CHEST WITH CONTRAST TECHNIQUE: Multidetector CT imaging of the chest was performed during intravenous contrast administration. CONTRAST:  7mL OMNIPAQUE IOHEXOL 300 MG/ML  SOLN COMPARISON:  CT scan of the abdomen and pelvis August 03, 2018 FINDINGS: Cardiovascular: The heart size is normal. The thoracic aorta is nonaneurysmal with no dissection or atherosclerotic changes. Central pulmonary arteries are normal in caliber. No  coronary artery calcifications are identified. Mediastinum/Nodes: Adenopathy in the axilla, right greater than left. A representative node in the right axilla on series 2, image 40 measures 2.1 cm. Retro pectoral adenopathy is seen on the right is well. No retropectoral adenopathy on the left. There is a mildly prominent epicardial node on series 2, image 120 measuring 8 mm, not definitely abnormal. A retrocrural node measures 11 mm on series 2, image 75, borderline. A few other shotty nodes are seen in the mediastinum without gross mediastinal adenopathy. No hilar adenopathy is identified. Lungs/Pleura: Central airways are normal. Paraseptal emphysematous changes in the apices, right greater than left. No pneumothorax. No suspicious nodules or masses. Dependent atelectasis is seen in the bases. There is a tiny left pleural effusion. Upper Abdomen: Splenomegaly is again identified. The patient's known splenic infarcts are not well assessed without contrast today. No other abnormalities in the upper abdomen. Musculoskeletal: No chest wall abnormality. No acute or significant osseous findings. IMPRESSION: 1. There is adenopathy in the bilateral axilla, right much greater than left. There is also retropectoral adenopathy on the right. Neoplastic and reactive/infectious/inflammatory causes are possible. One of the right axillary nodes should be amenable to ultrasound-guided biopsy if  clinically warranted. If biopsy is not pursued, recommend close follow-up. 2. Splenomegaly. The known splenic infarcts are not well assessed on this unenhanced study. 3. Mild paraseptal emphysematous changes in the apices. Emphysema (ICD10-J43.9). Electronically Signed   By: Dorise Bullion III M.D   On: 08/04/2018 13:40   Ct Abdomen Pelvis W Contrast  Result Date: 08/06/2018 CLINICAL DATA:  38 year old male with hepatosplenomegaly and recent biopsy of enlarged axillary lymph nodes. Patient continues to have abdominal pain with  distended abdomen. EXAM: CT ABDOMEN AND PELVIS WITH CONTRAST TECHNIQUE: Multidetector CT imaging of the abdomen and pelvis was performed using the standard protocol following bolus administration of intravenous contrast. CONTRAST:  156mL OMNIPAQUE IOHEXOL 300 MG/ML  SOLN COMPARISON:  CT of the abdomen pelvis dated 08/03/2018 FINDINGS: Lower chest: There are small bilateral pleural effusions, new since the prior CT. Minimal bibasilar dependent atelectatic changes noted. No intra-abdominal free air. Diffuse mesenteric edema and small ascites, increase in size since the prior CT. Hepatobiliary: The liver is enlarged measuring approximately 22 cm in midclavicular length. There is slight heterogeneous enhancement of the liver. No intrahepatic biliary ductal dilatation. Cholecystectomy. Pancreas: Unremarkable. No pancreatic ductal dilatation or surrounding inflammatory changes. Spleen: The spleen is enlarged with multiple areas of infarct as seen on the prior CT. The spleen measures approximately 22 cm in anterior-posterior dimension and 20 cm in craniocaudal length similar to prior CT. Adrenals/Urinary Tract: The adrenal glands are unremarkable. There is no hydronephrosis on either side head the symmetric enhancement and excretion of contrast by both kidneys. 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 The urinary bladder is distended and grossly unremarkable. Stomach/Bowel: Thickened and edematous appearance of the ascending and proximal transverse colon may be related to ascites. Colitis is considered less likely. Clinical correlation is recommended. There is no bowel obstruction. The appendix is normal. Vascular/Lymphatic: The abdominal aorta and IVC are unremarkable. No portal venous gas. There is no adenopathy. Reproductive: The prostate and seminal vesicles are grossly unremarkable. No pelvic mass. Other: Diffuse subcutaneous edema and anasarca new since the prior CT. Musculoskeletal: No acute or  significant osseous findings. IMPRESSION: 1. Hepatosplenomegaly with multiple areas of splenic infarct as seen on the prior CT. 2. Small bilateral pleural effusions, small ascites, diffuse mesenteric edema and anasarca new since the prior CT. 3. Thickened and edematous appearance of the ascending and proximal transverse colon may be related to ascites. Colitis is considered less likely. Clinical correlation is recommended. No bowel obstruction. Normal appendix. Electronically Signed   By: Anner Crete M.D.   On: 08/06/2018 00:54   US Liver Doppler  Result Date: 08/05/2018 CLINICAL DATA:  Abnormal liver function tests. EXAM: DUPLEX ULTRASOUND OF LIVER TECHNIQUE: Color and duplex Doppler ultrasound was performed to evaluate the hepatic in-flow and out-flow vessels. COMPARISON:  CT scan of August 03, 2018. FINDINGS: Liver: Increased echogenicity of hepatic parenchyma is noted. Normal hepatic contour without nodularity. No focal lesion, mass or intrahepatic biliary ductal dilatation. Main Portal Vein size: 1.73 cm Portal Vein Velocities Main Prox:  41.1 cm/sec Main Mid: 28.3 cm/sec Main Dist:  35.6 cm/sec Right: 21.4 cm/sec Left: 24.9 cm/sec Normal hepatopetal flow is noted in the portal veins. Hepatic Vein Velocities Right:  41.1 cm/sec Middle:  40.6 cm/sec Left:  34.9 cm/sec Normal hepatofugal flow is noted in the hepatic vein. IVC: Present and patent with normal respiratory phasicity. Hepatic Artery Velocity:  103.2 cm/sec Splenic Vein Velocity:  22 cm/sec Spleen: 16.25 cm x 20.7 cm x 10.63 cm with a total  volume of 1872 cm^3 (411 cm^3 is upper limit normal) Portal Vein Occlusion/Thrombus: No Splenic Vein Occlusion/Thrombus: No Ascites: None Varices: None IMPRESSION: No evidence of portal, hepatic or splenic venous thrombosis or occlusion. Increased echogenicity of hepatic parenchyma is noted suggesting hepatic steatosis. Severe splenomegaly is noted. Electronically Signed   By: Marijo Conception M.D.   On:  08/05/2018 12:13   [place] Core Biopsy (lymph Nodes)  Result Date: 08/05/2018 INDICATION: Lymphadenopathy, particularly in the right axillary region. Hepatosplenomegaly and splenic infarcts. EXAM: ULTRASOUND GUIDED CORE BIOPSY OF RIGHT AXILLARY LYMPH NODE MEDICATIONS: None. ANESTHESIA/SEDATION: None PROCEDURE: The procedure, risks, benefits, and alternatives were explained to the patient. Questions regarding the procedure were encouraged and answered. The patient understands and consents to the procedure. A time-out was performed prior to initiating the procedure. The right axilla was prepped with chlorhexidine in a sterile fashion, and a sterile drape was applied covering the operative field. A sterile gown and sterile gloves were used for the procedure. Local anesthesia was provided with 1% Lidocaine. Ultrasound was performed to localize right axillary lymph nodes. After choosing a lymph node for biopsy, a 16 gauge core biopsy device was utilized in obtaining 4 separate core biopsy samples of an enlarged lymph node. Core biopsy samples were submitted on Telfa gauze soaked with sterile saline. COMPLICATIONS: None immediate. FINDINGS: There are multiple enlarged right axillary lymph nodes. The largest measures approximately 3.5 x 1.9 x 1.8 cm. Solid tissue was obtained from this lymph node. IMPRESSION: Ultrasound-guided core biopsy performed of an enlarged right axillary lymph node measuring 3.5 cm in greatest dimension. Electronically Signed   By: Aletta Edouard M.D.   On: 08/05/2018 11:40   Medications: I have reviewed the patient's current medications. Scheduled Meds:  feeding supplement  1 Container Oral TID BM   haemophilus B polysaccharide conjugate vaccine  0.5 mL Intramuscular Once   meningococcal oligosaccharide  0.5 mL Intramuscular Once   multivitamin with minerals  1 tablet Oral Daily   nicotine  21 mg Transdermal Daily   pantoprazole (PROTONIX) IV  40 mg Intravenous Q12H   phytonadione   10 mg Subcutaneous Daily   pneumococcal 13-valent conjugate vaccine  0.5 mL Intramuscular Tomorrow-1000   Continuous Infusions: PRN Meds:.acetaminophen **OR** acetaminophen, alum & mag hydroxide-simeth, HYDROcodone-acetaminophen, morphine injection, [DISCONTINUED] ondansetron **OR** ondansetron (ZOFRAN) IV, polyethylene glycol, simethicone   Assessment: Active Problems:   Abdominal pain  Dakota Bowen is a 38 y.o. y/o male with a history of prior polysubstance abuse, possible cannabinoid hyperemesis syndrome rather than a diagnosis of cyclical vomiting syndrome as he has been using marijuana for a long time.  He has been admitted again on this occasion with abdominal pain, emesis and found to have an acute rise in the liver function tests with an INR of 1.7 suggesting acute liver failure.  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     Plan 1.  Abnormal liver function tests new onset: HSV,HIV,HEp B/A,ANA,CMV,COVID- negative  , USG doppler shows no vascular occlusions   2.  Hepatosplenomegaly no clear evidence of chronic liver disease or features of portal hypertension on imaging . The thrombocytopenia is acute not long standing.  3.  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 Hematology is following determine if there is any hypercoagulable disorder,blood-borne malignancy such as leukemia and lymphomas.  S/p bx of axillary lymph node  4.  Emesis likely secondary to cannabinoid hyperemesis syndrome.  He would need to stop consuming marijuana in the future.  Show more compliance to GI  follow-up.  Continue antiemetics, PPI  5.  Acute liver failure: INR 1.9 - unclear etiology- will send out autoimmune tests and genetic disorders although I feel more related to a lymphoproliferative disorder which is causing the hepatosplenomegaly .   6. Abdominal pain likely secondary to hepatosplenomegaly- awaiting bone marrow- determining underlying etiology will help to guide therapy which is going to take  a few days till we get the results. Surgery following , serial abdominal exams .     LOS: 2 days   Dakota Bellows, MD 08/06/2018, 8:48 AM

## 2018-08-06 NOTE — Consult Note (Signed)
MEDICATION RELATED CONSULT NOTE - FOLLOW UP   Pharmacy Consult for Vaccine recommendations  Indication: Impaired Splenic Function/Possible Spleen removal  Patient Measurements: Height: 6' (182.9 cm) Weight: 150 lb (68 kg) IBW/kg (Calculated) : 77.6  Vital Signs: Temp: 98.5 F (36.9 C) (07/22 0524) Temp Source: Oral (07/22 0524) BP: 115/76 (07/22 1022) Pulse Rate: 88 (07/22 1022) Intake/Output from previous day: 07/21 0701 - 07/22 0700 In: 1427.4 [P.O.:60; I.V.:1367.4] Out: 2775 [Urine:2725; Emesis/NG output:50] Intake/Output from this shift: No intake/output data recorded.  Labs: Recent Labs    08/03/18 1655 08/03/18 2218 08/04/18 0244 08/05/18 0355 08/05/18 0949 08/06/18 0518  WBC 7.1  --  6.6 5.6 5.5 6.3  HGB 16.1  --  16.2 12.8* 13.7 14.3  HCT 47.4  --  48.9 39.1 41.0 42.6  PLT 44*  --  39* 27* 25* 23*  APTT  --  40*  --   --  50*  --   CREATININE 0.81  --  0.82  --  0.65 0.67  ALBUMIN 3.4*  --   --   --  2.6* 2.8*  PROT 5.8*  --   --   --  4.5* 4.7*  AST 173*  --   --   --  259* 291*  ALT 82*  --   --   --  96* 108*  ALKPHOS 479*  --   --   --  377* 450*  BILITOT 1.6*  --   --   --  1.8* 2.2*   Estimated Creatinine Clearance: 118.1 mL/min (by C-G formula based on SCr of 0.67 mg/dL).    Assessment: This pharmacist was consulted for Vaccine recommendations for possible spleen removal.  Per Up-to-date, the most common vaccine-preventable causes of sepsis and severe infections in patients with impaired splenic function are S. pneumoniae (pneumococcus), H. influenzae type b, and N. meningitidis (meningococcus) [2,20,21].  For protection against these organisms, most patients with impaired splenic function require:  ?The 13-valent pneumococcal conjugate vaccine (PCV13) followed by the 23-valent pneumococcal polysaccharide vaccine (PPSV23) ?8 weeks later  ?The H. influenzae type b vaccine (Hib)  ?The quadrivalent meningococcal conjugate ACWY vaccine series  (MenACWY)  ?The monovalent meningococcal serogroup B vaccine series (MenB-4C or MenB-FHbp)  Plan:  Will administer HIB vaccine, PREVNAR 13, and Menveo today.  Patient should receive PPSV23 ~ 8 weeks from today and likely will need Meningococcal serogroup B vaccine at that time as well (Bexsero; Trumenba)  Lu Duffel, PharmD, BCPS Clinical Pharmacist 08/06/2018 10:40 AM

## 2018-08-06 NOTE — Progress Notes (Signed)
[facility] NAME: Dakota Bowen    MR#:  [id number]  DATE OF BIRTH:  12-14-1980  SUBJECTIVE:   continues with  abdominal pain left upper quadrant along with nausea .   No vomiting. REVIEW OF SYSTEMS:   Review of Systems  Constitutional: Negative for chills, fever and weight loss.  HENT: Negative for ear discharge, ear pain and nosebleeds.   Eyes: Negative for blurred vision, pain and discharge.  Respiratory: Negative for sputum production, shortness of breath, wheezing and stridor.   Cardiovascular: Negative for chest pain, palpitations, orthopnea and PND.  Gastrointestinal: Negative for abdominal pain, diarrhea, nausea and vomiting.  Genitourinary: Negative for frequency and urgency.  Musculoskeletal: Negative for back pain and joint pain.  Neurological: Negative for sensory change, speech change, focal weakness and weakness.  Psychiatric/Behavioral: Negative for depression and hallucinations. The patient is not nervous/anxious.    Tolerating Diet: npo  DRUG ALLERGIES:     VITALS:  Blood pressure 106/78, pulse 85, temperature 98.5 F (36.9 C), temperature source Oral, resp. rate 14, height 6' (1.829 m), weight 68 kg, SpO2 97 %.  PHYSICAL EXAMINATION:   Physical Exam  GENERAL:  38 y.o.-year-old patient lying in the bed with no acute distress. Appears ill EYES: Pupils equal, round, reactive to light and accommodation. No scleral icterus. Extraocular muscles intact.  HEENT: Head atraumatic, normocephalic. Oropharynx and nasopharynx clear.  NECK:  Supple, no jugular venous distention. No thyroid enlargement, no tenderness.  LUNGS: Normal breath sounds bilaterally, no wheezing, rales, rhonchi. No use of accessory muscles of respiration.  CARDIOVASCULAR: S1, S2 normal. No murmurs, rubs, or gallops.  ABDOMEN: Soft, left upper quadrant tender, distended. Few Bowel sounds present. No organomegaly or mass.  EXTREMITIES:  No cyanosis, clubbing or edema b/l.    NEUROLOGIC: Cranial nerves II through XII are intact. No focal Motor or sensory deficits b/l.   PSYCHIATRIC:  patient is alert and oriented x 3.  SKIN: No obvious rash, lesion, or ulcer. No petechial rash  LABORATORY PANEL:  CBC Recent Labs  Lab 08/06/18 0518  WBC 6.3  HGB 14.3  HCT 42.6  PLT 23*    Chemistries  Recent Labs  Lab 08/06/18 0518  NA 135  K 3.6  CL 101  CO2 23  GLUCOSE 73  BUN 9  CREATININE 0.67  CALCIUM 6.8*  AST 291*  ALT 108*  ALKPHOS 450*  BILITOT 2.2*   Cardiac Enzymes No results for input(s): TROPONINI in the last 168 hours. RADIOLOGY:  Ct Chest W Contrast  Result Date: 08/04/2018 CLINICAL DATA:  Nausea and upper abdominal pain. 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 EXAM: CT CHEST WITH CONTRAST TECHNIQUE: Multidetector CT imaging of the chest was performed during intravenous contrast administration. CONTRAST:  81mL OMNIPAQUE IOHEXOL 300 MG/ML  SOLN COMPARISON:  CT scan of the abdomen and pelvis August 03, 2018 FINDINGS: Cardiovascular: The heart size is normal. The thoracic aorta is nonaneurysmal with no dissection or atherosclerotic changes. Central pulmonary arteries are normal in caliber. No coronary artery calcifications are identified. Mediastinum/Nodes: Adenopathy in the axilla, right greater than left. A representative node in the right axilla on series 2, image 40 measures 2.1 cm. Retro pectoral adenopathy is seen on the right is well. No retropectoral adenopathy on the left. There is a mildly prominent epicardial node on series 2, image 120 measuring 8 mm, not definitely abnormal. A retrocrural node measures 11 mm on series 2,  image 75, borderline. A few other shotty nodes are seen in the mediastinum without gross mediastinal adenopathy. No hilar adenopathy is identified. Lungs/Pleura: Central airways are normal. Paraseptal emphysematous changes in the apices, right greater than left. No  pneumothorax. No suspicious nodules or masses. Dependent atelectasis is seen in the bases. There is a tiny left pleural effusion. Upper Abdomen: Splenomegaly is again identified. The patient's known splenic infarcts are not well assessed without contrast today. No other abnormalities in the upper abdomen. Musculoskeletal: No chest wall abnormality. No acute or significant osseous findings. IMPRESSION: 1. There is adenopathy in the bilateral axilla, right much greater than left. There is also retropectoral adenopathy on the right. Neoplastic and reactive/infectious/inflammatory causes are possible. One of the right axillary nodes should be amenable to ultrasound-guided biopsy if clinically warranted. If biopsy is not pursued, recommend close follow-up. 2. Splenomegaly. The known splenic infarcts are not well assessed on this unenhanced study. 3. Mild paraseptal emphysematous changes in the apices. Emphysema (ICD10-J43.9). Electronically Signed   By: Dorise Bullion III M.D   On: 08/04/2018 13:40   Ct Abdomen Pelvis W Contrast  Result Date: 08/06/2018 CLINICAL DATA:  38 year old male with hepatosplenomegaly and recent biopsy of enlarged axillary lymph nodes. Patient continues to have abdominal pain with distended abdomen. EXAM: CT ABDOMEN AND PELVIS WITH CONTRAST TECHNIQUE: Multidetector CT imaging of the abdomen and pelvis was performed using the standard protocol following bolus administration of intravenous contrast. CONTRAST:  157mL OMNIPAQUE IOHEXOL 300 MG/ML  SOLN COMPARISON:  CT of the abdomen pelvis dated 08/03/2018 FINDINGS: Lower chest: There are small bilateral pleural effusions, new since the prior CT. Minimal bibasilar dependent atelectatic changes noted. No intra-abdominal free air. Diffuse mesenteric edema and small ascites, increase in size since the prior CT. Hepatobiliary: The liver is enlarged measuring approximately 22 cm in midclavicular length. There is slight heterogeneous enhancement of the  liver. No intrahepatic biliary ductal dilatation. Cholecystectomy. Pancreas: Unremarkable. No pancreatic ductal dilatation or surrounding inflammatory changes. Spleen: The spleen is enlarged with multiple areas of infarct as seen on the prior CT. The spleen measures approximately 22 cm in anterior-posterior dimension and 20 cm in craniocaudal length similar to prior CT. Adrenals/Urinary Tract: The adrenal glands are unremarkable. There is no hydronephrosis on either side head the symmetric enhancement and excretion of contrast by both kidneys. 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 The urinary bladder is distended and grossly unremarkable. Stomach/Bowel: Thickened and edematous appearance of the ascending and proximal transverse colon may be related to ascites. Colitis is considered less likely. Clinical correlation is recommended. There is no bowel obstruction. The appendix is normal. Vascular/Lymphatic: The abdominal aorta and IVC are unremarkable. No portal venous gas. There is no adenopathy. Reproductive: The prostate and seminal vesicles are grossly unremarkable. No pelvic mass. Other: Diffuse subcutaneous edema and anasarca new since the prior CT. Musculoskeletal: No acute or significant osseous findings. IMPRESSION: 1. Hepatosplenomegaly with multiple areas of splenic infarct as seen on the prior CT. 2. Small bilateral pleural effusions, small ascites, diffuse mesenteric edema and anasarca new since the prior CT. 3. Thickened and edematous appearance of the ascending and proximal transverse colon may be related to ascites. Colitis is considered less likely. Clinical correlation is recommended. No bowel obstruction. Normal appendix. Electronically Signed   By: Anner Crete M.D.   On: 08/06/2018 00:54   US Liver Doppler  Result Date: 08/05/2018 CLINICAL DATA:  Abnormal liver function tests. EXAM: DUPLEX ULTRASOUND OF LIVER TECHNIQUE: Color and duplex Doppler ultrasound  was performed to  evaluate the hepatic in-flow and out-flow vessels. COMPARISON:  CT scan of August 03, 2018. FINDINGS: Liver: Increased echogenicity of hepatic parenchyma is noted. Normal hepatic contour without nodularity. No focal lesion, mass or intrahepatic biliary ductal dilatation. Main Portal Vein size: 1.73 cm Portal Vein Velocities Main Prox:  41.1 cm/sec Main Mid: 28.3 cm/sec Main Dist:  35.6 cm/sec Right: 21.4 cm/sec Left: 24.9 cm/sec Normal hepatopetal flow is noted in the portal veins. Hepatic Vein Velocities Right:  41.1 cm/sec Middle:  40.6 cm/sec Left:  34.9 cm/sec Normal hepatofugal flow is noted in the hepatic vein. IVC: Present and patent with normal respiratory phasicity. Hepatic Artery Velocity:  103.2 cm/sec Splenic Vein Velocity:  22 cm/sec Spleen: 16.25 cm x 20.7 cm x 10.63 cm with a total volume of 1872 cm^3 (411 cm^3 is upper limit normal) Portal Vein Occlusion/Thrombus: No Splenic Vein Occlusion/Thrombus: No Ascites: None Varices: None IMPRESSION: No evidence of portal, hepatic or splenic venous thrombosis or occlusion. Increased echogenicity of hepatic parenchyma is noted suggesting hepatic steatosis. Severe splenomegaly is noted. Electronically Signed   By: Marijo Conception M.D.   On: 08/05/2018 12:13   [place] Core Biopsy (lymph Nodes)  Result Date: 08/05/2018 INDICATION: Lymphadenopathy, particularly in the right axillary region. Hepatosplenomegaly and splenic infarcts. EXAM: ULTRASOUND GUIDED CORE BIOPSY OF RIGHT AXILLARY LYMPH NODE MEDICATIONS: None. ANESTHESIA/SEDATION: None PROCEDURE: The procedure, risks, benefits, and alternatives were explained to the patient. Questions regarding the procedure were encouraged and answered. The patient understands and consents to the procedure. A time-out was performed prior to initiating the procedure. The right axilla was prepped with chlorhexidine in a sterile fashion, and a sterile drape was applied covering the operative field. A sterile gown and sterile gloves  were used for the procedure. Local anesthesia was provided with 1% Lidocaine. Ultrasound was performed to localize right axillary lymph nodes. After choosing a lymph node for biopsy, a 16 gauge core biopsy device was utilized in obtaining 4 separate core biopsy samples of an enlarged lymph node. Core biopsy samples were submitted on Telfa gauze soaked with sterile saline. COMPLICATIONS: None immediate. FINDINGS: There are multiple enlarged right axillary lymph nodes. The largest measures approximately 3.5 x 1.9 x 1.8 cm. Solid tissue was obtained from this lymph node. IMPRESSION: Ultrasound-guided core biopsy performed of an enlarged right axillary lymph node measuring 3.5 cm in greatest dimension. Electronically Signed   By: Aletta Edouard M.D.   On: 08/05/2018 11:40   ASSESSMENT AND PLAN:  Dakota Bowen  is a 38 y.o. male with a known history of cyclical vomiting syndrome and GERD.  He presented to the emergency room reporting a 3-day history of intractable nausea and vomiting having become unable to eat or drink over the last 2 days without vomiting.  He reports having had no urine output over the last 24 hours.  He is also experiencing left upper abdominal pain which he describes as sharp and aching with a pain score 8-9 out of 10.  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 Janese Banks with hematology/oncology consulted for further evaluation and recommendations- - TEE. Negative for endocarditis -d/c ed IV heparin drip due to neg TEE and low plts. -CT chest shows significant lymphadenopathy..  -repeat CT abdomen 7/21--showed ascites/anasarca/no splenic rupture/significant LAD - s/p ultrasound-guided core lymph node biopsy from right axilla today- to  rule out lymphoma--results pending -BM biopsy today -Dr Windell Moment to  see for possible splenectomy -Pt will receive vaccines today (H influenza, meningococcal and pneumococcal)  2. intractable nausea vomiting with  Dehydration--resolved - tolerating po diet - Likely secondary to intractable nausea and vomiting with cyclical nausea and vomiting history.  - prn IV antiemetic  3.  Elevated liver function studies - Hepatitis panel B and C negative - Gastroenterology Dr Georgeann Oppenheim input noted  -Liver doppler no Portal vein thrombosis  4.  Epigastric and upper abdominal pain due to splenic infacrts - Pain is being managed with IV analgesic -  GERD with IV Protonix  5. Thrombocytopenia likely secondary to liver injury/? Lymphoma -per Dr. Janese Banks peripheral smear does not showschistocytes -LDH elevated 1117--913 -Plt count 39K--27K--23 K -transfuse plts as needed. No bleeding  D/w mom on the phone  Case discussed with Care Management/Social Worker. Management plans discussed with the patient , Dr Janese Banks   CODE STATUS: full  DVT Prophylaxis: heparin gtt  TOTAL TIME TAKING CARE OF THIS PATIENT: *30* minutes.  >50% time spent on counselling and coordination of care  POSSIBLE D/C IN few DAYS, DEPENDING ON CLINICAL CONDITION.  Note: This dictation was prepared with Dragon dictation along with smaller phrase technology. Any transcriptional errors that result from this process are unintentional.  Fritzi Mandes M.D on 08/06/2018 at 9:02 AM  Between 7am to 6pm - Pager - [phone]  After 6pm go to [email or web address] - password [email or web address] [facility]  Office  [phone]  CC: Primary care physician; Albina Billet, MDPatient ID: Dakota Bowen, male   DOB: 1980/02/09, 38 y.o.   MRN: [id number]

## 2018-08-06 NOTE — Assessment & Plan Note (Addendum)
#  38 year old male patient is currently admitted to hospital for hepatosplenomegaly/abdominal pain axillary and thoracic adenopathy-highly concerning for malignancy.  #Hepatosplenomegaly/axial adenopathy-status post ultrasound-guided lymph node biopsy yesterday.  Awaiting results.  Given the concerns for lymphoma-patient awaiting bone marrow biopsy this afternoon.  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  Start patient on low-dose of Lovenox 40 mg subcu [low platelets below]. Agree that this needs to be monitored closely as patient is at risk for splenic rupture.  #Thrombocytopenia-hepatosplenomegaly underlying possible malignancy-[splenic infarcts].  Lovenox 40 mg started/monitor closely.   #Above plan of care was discussed the patient.  Patient agreement.

## 2018-08-06 NOTE — Procedures (Signed)
Interventional Radiology Procedure Note  Procedure: CT guided bone marrow aspiration and biopsy  Complications: None  EBL: < 10 mL  Findings: Aspirate and core biopsy performed of bone marrow in right iliac bone.  Plan: Bedrest supine x 1 hrs  Jasyah Theurer T. Ashvin Adelson, M.D Pager:  [phone]   

## 2018-08-07 LAB — CBC WITH DIFFERENTIAL/PLATELET
Abs Immature Granulocytes: 0.08 10*3/uL — ABNORMAL HIGH (ref 0.00–0.07)
Abs Immature Granulocytes: 0.07 10*3/uL (ref 0.00–0.07)
Basophils Absolute: 0 10*3/uL (ref 0.0–0.1)
Basophils Absolute: 0 10*3/uL (ref 0.0–0.1)
Basophils Relative: 1 %
Basophils Relative: 1 %
Eosinophils Absolute: 0 10*3/uL (ref 0.0–0.5)
Eosinophils Absolute: 0 10*3/uL (ref 0.0–0.5)
Eosinophils Relative: 0 %
Eosinophils Relative: 0 %
HCT: 42.6 % (ref 39.0–52.0)
HCT: 41.1 % (ref 39.0–52.0)
Hemoglobin: 14.3 g/dL (ref 13.0–17.0)
Hemoglobin: 13.8 g/dL (ref 13.0–17.0)
Immature Granulocytes: 1 %
Immature Granulocytes: 1 %
Lymphocytes Relative: 17 %
Lymphocytes Relative: 24 %
Lymphs Abs: 1.1 10*3/uL (ref 0.7–4.0)
Lymphs Abs: 1.4 10*3/uL (ref 0.7–4.0)
MCH: 30.4 pg (ref 26.0–34.0)
MCH: 30.2 pg (ref 26.0–34.0)
MCHC: 33.6 g/dL (ref 30.0–36.0)
MCHC: 33.6 g/dL (ref 30.0–36.0)
MCV: 90.4 fL (ref 80.0–100.0)
MCV: 89.9 fL (ref 80.0–100.0)
Monocytes Absolute: 0.2 10*3/uL (ref 0.1–1.0)
Monocytes Absolute: 0.2 10*3/uL (ref 0.1–1.0)
Monocytes Relative: 3 %
Monocytes Relative: 3 %
Neutro Abs: 4.9 10*3/uL (ref 1.7–7.7)
Neutro Abs: 4.2 10*3/uL (ref 1.7–7.7)
Neutrophils Relative %: 78 %
Neutrophils Relative %: 71 %
Platelets: 23 10*3/uL — CL (ref 150–400)
Platelets: 21 10*3/uL — CL (ref 150–400)
RBC: 4.71 MIL/uL (ref 4.22–5.81)
RBC: 4.57 MIL/uL (ref 4.22–5.81)
RDW: 14.8 % (ref 11.5–15.5)
RDW: 15.2 % (ref 11.5–15.5)
WBC: 6.3 10*3/uL (ref 4.0–10.5)
WBC: 5.9 10*3/uL (ref 4.0–10.5)
nRBC: 1.4 % — ABNORMAL HIGH (ref 0.0–0.2)
nRBC: 1.5 % — ABNORMAL HIGH (ref 0.0–0.2)

## 2018-08-07 LAB — COMPREHENSIVE METABOLIC PANEL
ALT: 96 U/L — ABNORMAL HIGH (ref 0–44)
AST: 247 U/L — ABNORMAL HIGH (ref 15–41)
Albumin: 2.7 g/dL — ABNORMAL LOW (ref 3.5–5.0)
Alkaline Phosphatase: 564 U/L — ABNORMAL HIGH (ref 38–126)
Anion gap: 11 (ref 5–15)
BUN: 14 mg/dL (ref 6–20)
CO2: 23 mmol/L (ref 22–32)
Calcium: 7.2 mg/dL — ABNORMAL LOW (ref 8.9–10.3)
Chloride: 100 mmol/L (ref 98–111)
Creatinine, Ser: 0.61 mg/dL (ref 0.61–1.24)
GFR calc Af Amer: >60 mL/min (ref >60)
GFR calc non Af Amer: >60 mL/min (ref >60)
Glucose, Bld: 84 mg/dL (ref 70–99)
Potassium: 3.7 mmol/L (ref 3.5–5.1)
Sodium: 134 mmol/L — ABNORMAL LOW (ref 135–145)
Total Bilirubin: 2.8 mg/dL — ABNORMAL HIGH (ref 0.3–1.2)
Total Protein: 4.8 g/dL — ABNORMAL LOW (ref 6.5–8.1)

## 2018-08-07 LAB — PROTEIN C, TOTAL: Protein C, Total: 43 % — ABNORMAL LOW (ref 60–150)

## 2018-08-07 LAB — PROTEIN S, TOTAL: Protein S Ag, Total: 35 % — ABNORMAL LOW (ref 60–150)

## 2018-08-07 LAB — PROTIME-INR
INR: 1.9 — ABNORMAL HIGH (ref 0.8–1.2)
Prothrombin Time: 21.4 seconds — ABNORMAL HIGH (ref 11.4–15.2)

## 2018-08-07 LAB — PROTEIN C ACTIVITY: Protein C Activity: 38 % — ABNORMAL LOW (ref 73–180)

## 2018-08-07 LAB — PROTEIN S ACTIVITY: Protein S Activity: <8 % — ABNORMAL LOW (ref 63–140)

## 2018-08-07 LAB — HEPATITIS B DNA, ULTRAQUANTITATIVE, PCR
HBV DNA SERPL PCR-ACNC: NOT DETECTED IU/mL
HBV DNA SERPL PCR-LOG IU: UNDETERMINED log10 IU/mL

## 2018-08-07 LAB — LACTATE DEHYDROGENASE: LDH: 1362 U/L — ABNORMAL HIGH (ref 98–192)

## 2018-08-07 LAB — DRVVT MIX: dRVVT Mix: 35.6 s (ref 0.0–47.0)

## 2018-08-07 LAB — HIV-1 RNA, PCR (GRAPH) RFX/GENO EDI
HIV-1 RNA BY PCR: <20 copies/mL
HIV-1 RNA Quant, Log: UNDETERMINED log10copy/mL

## 2018-08-07 LAB — ANTI-SMOOTH MUSCLE ANTIBODY, IGG: F-Actin IgG: 4 Units (ref 0–19)

## 2018-08-07 LAB — FUNGITELL, SERUM: Fungitell Result: <31 pg/mL (ref <80)

## 2018-08-07 LAB — LUPUS ANTICOAGULANT PANEL
DRVVT: 57.2 s — ABNORMAL HIGH (ref 0.0–47.0)
PTT Lupus Anticoagulant: 44.8 s (ref 0.0–51.9)

## 2018-08-07 LAB — HISTOPLASMA ANTIGEN, URINE: Histoplasma Antigen, urine: <0.5 (ref <0.5)

## 2018-08-07 LAB — CERULOPLASMIN: Ceruloplasmin: 18.6 mg/dL (ref 16.0–31.0)

## 2018-08-07 MED ORDER — ALPRAZOLAM 0.25 MG PO TABS
0.2500 mg | ORAL_TABLET | Freq: Three times a day (TID) | ORAL | Status: DC | PRN
  Administered 2018-08-08 (×3): 0.25 mg via ORAL
  Filled 2018-08-07 (×3): qty 1

## 2018-08-07 MED ORDER — PREDNISONE 50 MG PO TABS
100.0000 mg | ORAL_TABLET | Freq: Every day | ORAL | Status: DC
  Administered 2018-08-07 – 2018-08-08 (×2): 100 mg via ORAL
  Filled 2018-08-07 (×2): qty 2

## 2018-08-07 MED ORDER — PROMETHAZINE HCL 25 MG/ML IJ SOLN
12.5000 mg | Freq: Four times a day (QID) | INTRAMUSCULAR | Status: DC | PRN
  Administered 2018-08-07 – 2018-08-08 (×2): 12.5 mg via INTRAVENOUS
  Filled 2018-08-07 (×2): qty 1

## 2018-08-07 MED ORDER — VITAMIN C 500 MG PO TABS
500.0000 mg | ORAL_TABLET | Freq: Two times a day (BID) | ORAL | Status: DC
  Administered 2018-08-08: 500 mg via ORAL
  Filled 2018-08-07 (×3): qty 1

## 2018-08-07 MED ORDER — ENSURE ENLIVE PO LIQD
237.0000 mL | Freq: Three times a day (TID) | ORAL | Status: DC
  Administered 2018-08-08: 237 mL via ORAL

## 2018-08-07 NOTE — Progress Notes (Signed)
Nutrition Follow Up Note   DOCUMENTATION CODES:   Not applicable  INTERVENTION:   Ensure Enlive po TID, each supplement provides 350 kcal and 20 grams of protein  MVI daily   Vitamin C 573m po BID  Pt likely at moderate refeed risk; recommend monitor K, Mg and P labs daily   NUTRITION DIAGNOSIS:   Inadequate oral intake related to acute illness as evidenced by other (comment)(pt on clear liquid diet).  GOAL:   Patient will meet greater than or equal to 90% of their needs  -slowly progressing   MONITOR:   PO intake, Supplement acceptance, Labs, Diet advancement, Weight trends, Skin, I & O's  ASSESSMENT:   38y.o. male with a known history of cyclical vomiting syndrome and GERD.  Spoke with pt today, pt reports continued poor appetite and oral intake but reports that he is eating as much as he can. Pt has been drinking the Boost supplements. Pt reports consistent abdominal pain and nausea along with intermittent vomiting. Pt is unsure of any weight loss as he reports that he does not weigh himself regularly. RD will change pt over to Ensure as this will provide more calories and protein. RD will also add vitamin C in setting of elevated INR. No new weight since 7/19; recommend weekly weights.    Medications reviewed and include: protonix, MVI, nicotine, lovenox  Labs reviewed: Na 134(L), K 3.7 wnl, alk phos 564(H), AST 247(H), ALT 96(H)  Diet Order:   Diet Order            Diet regular Room service appropriate? Yes; Fluid consistency: Thin  Diet effective now             EDUCATION NEEDS:   No education needs have been identified at this time  Skin:  Skin Assessment: Reviewed RN Assessment  Last BM:  7/21  Height:   Ht Readings from Last 1 Encounters:  08/03/18 6' (1.829 m)    Weight:   Wt Readings from Last 1 Encounters:  08/03/18 68 kg    Ideal Body Weight:  80.9 kg  BMI:  Body mass index is 20.34 kg/m.  Estimated Nutritional Needs:   Kcal:   2100-2400kcal/day  Protein:  105-120g/day  Fluid:  >2L/day  CKoleen DistanceMS, RD, LDN Pager #- [phone]#- [phone] Hours Pager: [phone]

## 2018-08-07 NOTE — Progress Notes (Signed)
[facility] NAME: Dakota Bowen    MR#:  [id number]  DATE OF BIRTH:  Oct 20, 1980  SUBJECTIVE:   continues with  abdominal pain left upper quadrant along with nausea , poor po intake and hiccups  vomited last nite REVIEW OF SYSTEMS:   Review of Systems  Constitutional: Negative for chills, fever and weight loss.  HENT: Negative for ear discharge, ear pain and nosebleeds.   Eyes: Negative for blurred vision, pain and discharge.  Respiratory: Negative for sputum production, shortness of breath, wheezing and stridor.   Cardiovascular: Negative for chest pain, palpitations, orthopnea and PND.  Gastrointestinal: Negative for abdominal pain, diarrhea, nausea and vomiting.  Genitourinary: Negative for frequency and urgency.  Musculoskeletal: Negative for back pain and joint pain.  Neurological: Negative for sensory change, speech change, focal weakness and weakness.  Psychiatric/Behavioral: Negative for depression and hallucinations. The patient is not nervous/anxious.    Tolerating Diet: regular  DRUG ALLERGIES:     VITALS:  Blood pressure 103/90, pulse 77, temperature 98.1 F (36.7 C), temperature source Oral, resp. rate 16, height 6' (1.829 m), weight 68 kg, SpO2 99 %.  PHYSICAL EXAMINATION:   Physical Exam  GENERAL:  38 y.o.-year-old patient lying in the bed with no acute distress. Appears ill EYES: Pupils equal, round, reactive to light and accommodation. No scleral icterus. Extraocular muscles intact.  HEENT: Head atraumatic, normocephalic. Oropharynx and nasopharynx clear.  NECK:  Supple, no jugular venous distention. No thyroid enlargement, no tenderness.  LUNGS: Normal breath sounds bilaterally, no wheezing, rales, rhonchi. No use of accessory muscles of respiration.  CARDIOVASCULAR: S1, S2 normal. No murmurs, rubs, or gallops.  ABDOMEN: Soft, left upper quadrant tender, distended. Few Bowel sounds present. HS  megaly+ EXTREMITIES: No cyanosis, clubbing or edema b/l.    NEUROLOGIC: Cranial nerves II through XII are intact. No focal Motor or sensory deficits b/l.   PSYCHIATRIC:  patient is alert and oriented x 3.  SKIN: No obvious rash, lesion, or ulcer. No petechial rash  LABORATORY PANEL:  CBC Recent Labs  Lab 08/06/18 0518  WBC 6.3  HGB 14.3  HCT 42.6  PLT 23*    Chemistries  Recent Labs  Lab 08/06/18 0518  NA 135  K 3.6  CL 101  CO2 23  GLUCOSE 73  BUN 9  CREATININE 0.67  CALCIUM 6.8*  AST 291*  ALT 108*  ALKPHOS 450*  BILITOT 2.2*   Cardiac Enzymes No results for input(s): TROPONINI in the last 168 hours. RADIOLOGY:  Ct Abdomen Pelvis W Contrast  Result Date: 08/06/2018 CLINICAL DATA:  38 year old male with hepatosplenomegaly and recent biopsy of enlarged axillary lymph nodes. Patient continues to have abdominal pain with distended abdomen. EXAM: CT ABDOMEN AND PELVIS WITH CONTRAST TECHNIQUE: Multidetector CT imaging of the abdomen and pelvis was performed using the standard protocol following bolus administration of intravenous contrast. CONTRAST:  114m OMNIPAQUE IOHEXOL 300 MG/ML  SOLN COMPARISON:  CT of the abdomen pelvis dated 08/03/2018 FINDINGS: Lower chest: There are small bilateral pleural effusions, new since the prior CT. Minimal bibasilar dependent atelectatic changes noted. No intra-abdominal free air. Diffuse mesenteric edema and small ascites, increase in size since the prior CT. Hepatobiliary: The liver is enlarged measuring approximately 22 cm in midclavicular length. There is slight heterogeneous enhancement of the liver. No intrahepatic biliary ductal dilatation. Cholecystectomy. Pancreas: Unremarkable. No pancreatic ductal dilatation or surrounding inflammatory changes. Spleen: The spleen is enlarged with multiple areas of infarct  as seen on the prior CT. The spleen measures approximately 22 cm in anterior-posterior dimension and 20 cm in craniocaudal length  similar to prior CT. Adrenals/Urinary Tract: The adrenal glands are unremarkable. There is no hydronephrosis on either side head the symmetric enhancement and excretion of contrast by both kidneys. 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 The urinary bladder is distended and grossly unremarkable. Stomach/Bowel: Thickened and edematous appearance of the ascending and proximal transverse colon may be related to ascites. Colitis is considered less likely. Clinical correlation is recommended. There is no bowel obstruction. The appendix is normal. Vascular/Lymphatic: The abdominal aorta and IVC are unremarkable. No portal venous gas. There is no adenopathy. Reproductive: The prostate and seminal vesicles are grossly unremarkable. No pelvic mass. Other: Diffuse subcutaneous edema and anasarca new since the prior CT. Musculoskeletal: No acute or significant osseous findings. IMPRESSION: 1. Hepatosplenomegaly with multiple areas of splenic infarct as seen on the prior CT. 2. Small bilateral pleural effusions, small ascites, diffuse mesenteric edema and anasarca new since the prior CT. 3. Thickened and edematous appearance of the ascending and proximal transverse colon may be related to ascites. Colitis is considered less likely. Clinical correlation is recommended. No bowel obstruction. Normal appendix. Electronically Signed   By: Anner Crete M.D.   On: 08/06/2018 00:54   Ct Bone Marrow Biopsy & Aspiration  Result Date: 08/06/2018 CLINICAL DATA:  Lymphadenopathy and hepatosplenomegaly. Bone marrow biopsy required for further workup. EXAM: CT GUIDED BONE MARROW ASPIRATION AND BIOPSY ANESTHESIA/SEDATION: Versed 3.0 mg IV, Fentanyl 75 mcg IV Total Moderate Sedation Time:   12 minutes. The patient's level of consciousness and physiologic status were continuously monitored during the procedure by Radiology nursing. PROCEDURE: The procedure risks, benefits, and alternatives were explained to the patient.  Questions regarding the procedure were encouraged and answered. The patient understands and consents to the procedure. A time out was performed prior to initiating the procedure. The right gluteal region was prepped with chlorhexidine. Sterile gown and sterile gloves were used for the procedure. Local anesthesia was provided with 1% Lidocaine. Under CT guidance, an 11 gauge On Control bone cutting needle was advanced from a posterior approach into the right iliac bone. Needle positioning was confirmed with CT. Initial non heparinized and heparinized aspirate samples were obtained of bone marrow. Core biopsy was performed via the On Control drill needle. COMPLICATIONS: None FINDINGS: Inspection of initial aspirate did reveal visible particles. Intact core biopsy sample was obtained. IMPRESSION: CT guided bone marrow biopsy of right posterior iliac bone with both aspirate and core samples obtained. Electronically Signed   By: Aletta Edouard M.D.   On: 08/06/2018 12:01   US Liver Doppler  Result Date: 08/05/2018 CLINICAL DATA:  Abnormal liver function tests. EXAM: DUPLEX ULTRASOUND OF LIVER TECHNIQUE: Color and duplex Doppler ultrasound was performed to evaluate the hepatic in-flow and out-flow vessels. COMPARISON:  CT scan of August 03, 2018. FINDINGS: Liver: Increased echogenicity of hepatic parenchyma is noted. Normal hepatic contour without nodularity. No focal lesion, mass or intrahepatic biliary ductal dilatation. Main Portal Vein size: 1.73 cm Portal Vein Velocities Main Prox:  41.1 cm/sec Main Mid: 28.3 cm/sec Main Dist:  35.6 cm/sec Right: 21.4 cm/sec Left: 24.9 cm/sec Normal hepatopetal flow is noted in the portal veins. Hepatic Vein Velocities Right:  41.1 cm/sec Middle:  40.6 cm/sec Left:  34.9 cm/sec Normal hepatofugal flow is noted in the hepatic vein. IVC: Present and patent with normal respiratory phasicity. Hepatic Artery Velocity:  103.2 cm/sec Splenic Vein  Velocity:  22 cm/sec Spleen: 16.25 cm x  20.7 cm x 10.63 cm with a total volume of 1872 cm^3 (411 cm^3 is upper limit normal) Portal Vein Occlusion/Thrombus: No Splenic Vein Occlusion/Thrombus: No Ascites: None Varices: None IMPRESSION: No evidence of portal, hepatic or splenic venous thrombosis or occlusion. Increased echogenicity of hepatic parenchyma is noted suggesting hepatic steatosis. Severe splenomegaly is noted. Electronically Signed   By: Marijo Conception M.D.   On: 08/05/2018 12:13   [place] Core Biopsy (lymph Nodes)  Result Date: 08/05/2018 INDICATION: Lymphadenopathy, particularly in the right axillary region. Hepatosplenomegaly and splenic infarcts. EXAM: ULTRASOUND GUIDED CORE BIOPSY OF RIGHT AXILLARY LYMPH NODE MEDICATIONS: None. ANESTHESIA/SEDATION: None PROCEDURE: The procedure, risks, benefits, and alternatives were explained to the patient. Questions regarding the procedure were encouraged and answered. The patient understands and consents to the procedure. A time-out was performed prior to initiating the procedure. The right axilla was prepped with chlorhexidine in a sterile fashion, and a sterile drape was applied covering the operative field. A sterile gown and sterile gloves were used for the procedure. Local anesthesia was provided with 1% Lidocaine. Ultrasound was performed to localize right axillary lymph nodes. After choosing a lymph node for biopsy, a 16 gauge core biopsy device was utilized in obtaining 4 separate core biopsy samples of an enlarged lymph node. Core biopsy samples were submitted on Telfa gauze soaked with sterile saline. COMPLICATIONS: None immediate. FINDINGS: There are multiple enlarged right axillary lymph nodes. The largest measures approximately 3.5 x 1.9 x 1.8 cm. Solid tissue was obtained from this lymph node. IMPRESSION: Ultrasound-guided core biopsy performed of an enlarged right axillary lymph node measuring 3.5 cm in greatest dimension. Electronically Signed   By: Aletta Edouard M.D.   On: 08/05/2018  11:40   ASSESSMENT AND PLAN:  Dakota Bowen  is a 39 y.o. male with a known history of cyclical vomiting syndrome and GERD.  He presented to the emergency room reporting a 3-day history of intractable nausea and vomiting having become unable to eat or drink over the last 2 days without vomiting.  He reports having had no urine output over the last 24 hours.  He is also experiencing left upper abdominal pain which he describes as sharp and aching with a pain score 8-9 out of 10.  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 Janese Banks with hematology/oncology consulted for further evaluation and recommendations - TEE Negative for endocarditis -d/c ed IV heparin drip due to neg TEE and low plts. -CT chest on admission shows significant lymphadenopathy..  -repeat CT abdomen 7/21--showed ascites/anasarca/ no splenic rupture/significant LAD/multiple splenic infarct - s/p ultrasound-guided core lymph node biopsy from right axilla today- to rule out lymphoma--results pending -s/p BM biopsy --results pending -Dr Windell Moment to see for possible splenectomy--on hold till bx results are back -Pt is s/p (July 22 nd) H influenza, meningococcal and pneumococcal vaccines in prep for possible splenectomy. -Patient should receive PPSV23 ~ 8 weeks from today and likely will need Meningococcal serogroup B vaccine at that time as well (Bexsero; Trumenba  2. intractable nausea vomiting with  Dehydration--resolved - tolerating po diet some -dietitian to see - prn IV antiemetic  3.  Elevated liver function studies with severe hepatomegaly - Hepatitis panel B and C negative - Gastroenterology Dr Georgeann Oppenheim input noted  -Liver doppler no Portal vein thrombosis -ceruloplasmin  neg -Antismooth muscle antibody, varicella zoster PCR,  immunoglobulin quantitative, alpha-1 and trypsin phenotype pending  4.  Epigastric and upper abdominal pain due to splenic infacrts - Pain is being managed with IV analgesic -  GERD with IV Protonix  5. Thrombocytopenia likely secondary to liver injury/? Lymphoma -per Dr. Janese Banks peripheral smear does not showschistocytes -LDH elevated 1117--913 -Plt count 39K--27K--23 K -transfuse plts as needed. No bleeding  D/w mom on the phone   Case discussed with Care Management/Social Worker. Management plans discussed with the patient , Dr Janese Banks   CODE STATUS: full  DVT Prophylaxis: heparin gtt  TOTAL TIME TAKING CARE OF THIS PATIENT: *30* minutes.  >50% time spent on counselling and coordination of care  POSSIBLE D/C IN few DAYS, DEPENDING ON CLINICAL CONDITION.  Note: This dictation was prepared with Dragon dictation along with smaller phrase technology. Any transcriptional errors that result from this process are unintentional.  Fritzi Mandes M.D on 08/07/2018 at 8:13 AM  Between 7am to 6pm - Pager - [phone]  After 6pm go to [email or web address] - password [email or web address] [facility]  Office  [phone]  CC: Primary care physician; Albina Billet, MDPatient ID: Dakota Bowen, male   DOB: 12/25/1980, 38 y.o.   MRN: [id number]

## 2018-08-07 NOTE — Progress Notes (Signed)
Hematology/Oncology Consult note [facility]  Telephone:([phone] [phone]  Patient Care Team: Albina Billet, MD as PCP - General (Internal Medicine)   Name of the patient: Dakota Bowen  [id number]  09/20/1980   Date of visit: 08/07/2018   Interval history- abdomen still feels bloated and tight. Continues to have abdominal pain   Review of systems- Review of Systems  Constitutional: Positive for malaise/fatigue. Negative for chills, fever and weight loss.  HENT: Negative for congestion, ear discharge and nosebleeds.   Eyes: Negative for blurred vision.  Respiratory: Negative for cough, hemoptysis, sputum production, shortness of breath and wheezing.   Cardiovascular: Negative for chest pain, palpitations, orthopnea and claudication.  Gastrointestinal: Positive for abdominal pain and nausea. Negative for blood in stool, constipation, diarrhea, heartburn, melena and vomiting.  Genitourinary: Negative for dysuria, flank pain, frequency, hematuria and urgency.  Musculoskeletal: Negative for back pain, joint pain and myalgias.  Skin: Negative for rash.  Neurological: Negative for dizziness, tingling, focal weakness, seizures, weakness and headaches.  Endo/Heme/Allergies: Does not bruise/bleed easily.  Psychiatric/Behavioral: Negative for depression and suicidal ideas. The patient does not have insomnia.       Allergies  Allergen Reactions   Penicillins Itching, Rash and Other (See Comments)          Past Medical History:  Diagnosis Date   Cyclical vomiting syndrome    GERD (gastroesophageal reflux disease)    GERD (gastroesophageal reflux disease)      Past Surgical History:  Procedure Laterality Date   CHOLECYSTECTOMY     ESOPHAGOGASTRODUODENOSCOPY (EGD) WITH PROPOFOL N/A 06/21/2014   Procedure: ESOPHAGOGASTRODUODENOSCOPY (EGD) WITH PROPOFOL. Looking in the esophagus stomach and upper small intestine with a lighted tube to  examine and treat.;  Surgeon: Lollie Sails, MD;  Location: [facility] ENDOSCOPY;  Service: Endoscopy;  Laterality: N/A;   TEE WITHOUT CARDIOVERSION N/A 08/05/2018   Procedure: TRANSESOPHAGEAL ECHOCARDIOGRAM (TEE);  Surgeon: Corey Skains, MD;  Location: [facility] ORS;  Service: Cardiovascular;  Laterality: N/A;    Social History   Socioeconomic History   Marital status: Single    Spouse name: Not on file   Number of children: Not on file   Years of education: Not on file   Highest education level: Not on file  Occupational History   Not on file  Social Needs   Financial resource strain: Not on file   Food insecurity    Worry: Not on file    Inability: Not on file   Transportation needs    Medical: Not on file    Non-medical: Not on file  Tobacco Use   Smoking status: Current Every Day Smoker    Packs/day: 1.50   Smokeless tobacco: Never Used  Substance and Sexual Activity   Alcohol use: Yes    Comment: 3-4beers/day   Drug use: Yes    Types: Marijuana, Cocaine, Heroin   Sexual activity: Not on file  Lifestyle   Physical activity    Days per week: Not on file    Minutes per session: Not on file   Stress: Not on file  Relationships   Social connections    Talks on phone: Not on file    Gets together: Not on file    Attends religious service: Not on file    Active member of club or organization: Not on file    Attends meetings of clubs or organizations: Not on file    Relationship status: Not on file  Intimate partner violence    Fear of current or ex partner: Not on file    Emotionally abused: Not on file    Physically abused: Not on file    Forced sexual activity: Not on file  Other Topics Concern   Not on file  Social History Narrative   Not on file    Family History  Problem Relation Age of Onset   Diabetes Mother    Cancer Mother    Diabetes Father      Current Facility-Administered Medications:    acetaminophen (TYLENOL) tablet  650 mg, 650 mg, Oral, Q6H PRN **OR** acetaminophen (TYLENOL) suppository 650 mg, 650 mg, Rectal, Q6H PRN, Seals, Theo Dills, NP   alum & mag hydroxide-simeth (MAALOX/MYLANTA) 200-200-20 MG/5ML suspension 30 mL, 30 mL, Oral, Q6H PRN, Seals, Angela H, NP   enoxaparin (LOVENOX) injection 40 mg, 40 mg, Subcutaneous, Q24H, Brahmanday, Govinda R, MD, 40 mg at 08/06/18 2039   feeding supplement (BOOST / RESOURCE BREEZE) liquid 1 Container, 1 Container, Oral, TID BM, Fritzi Mandes, MD, 1 Container at 08/06/18 2038   HYDROcodone-acetaminophen (NORCO/VICODIN) 5-325 MG per tablet 1-2 tablet, 1-2 tablet, Oral, Q4H PRN, Fritzi Mandes, MD, 2 tablet at 08/07/18 9163   morphine 2 MG/ML injection 2 mg, 2 mg, Intravenous, Q3H PRN, Fritzi Mandes, MD, 2 mg at 08/07/18 0341   multivitamin with minerals tablet 1 tablet, 1 tablet, Oral, Daily, Fritzi Mandes, MD   nicotine (NICODERM CQ - dosed in mg/24 hours) patch 21 mg, 21 mg, Transdermal, Daily, Fritzi Mandes, MD, 21 mg at 08/06/18 2038   [DISCONTINUED] ondansetron (ZOFRAN) tablet 4 mg, 4 mg, Oral, Q6H PRN **OR** ondansetron (ZOFRAN) injection 4 mg, 4 mg, Intravenous, Q4H PRN, Fritzi Mandes, MD, 4 mg at 08/07/18 0604   pantoprazole (PROTONIX) EC tablet 40 mg, 40 mg, Oral, BID AC, Fritzi Mandes, MD, 40 mg at 08/06/18 1802   polyethylene glycol (MIRALAX / GLYCOLAX) packet 17 g, 17 g, Oral, Daily PRN, Seals, Theo Dills, NP   simethicone (MYLICON) chewable tablet 80 mg, 80 mg, Oral, TID PRN, Gardiner Barefoot H, NP, 80 mg at 08/05/18 2324  Physical exam:  Vitals:   08/06/18 1113 08/06/18 1205 08/06/18 2054 08/07/18 0550  BP: 106/87 103/73 108/85 103/90  Pulse: 98 99 83 77  Resp: _0 Temp: 98.1 F (36.7 C) 98.7 F (37.1 C) 98.1 F (36.7 C) 98.1 F (36.7 C)  TempSrc: Oral Oral Oral Oral  SpO2: 98% 97% 98% 99%  Weight:      Height:       Physical Exam Constitutional:      Comments: Thin man in distress from abdominal pain  HENT:     Head: Normocephalic and  atraumatic.  Eyes:     Pupils: Pupils are equal, round, and reactive to light.  Neck:     Musculoskeletal: Normal range of motion.  Cardiovascular:     Rate and Rhythm: Normal rate and regular rhythm.     Heart sounds: Normal heart sounds.  Pulmonary:     Effort: Pulmonary effort is normal.     Breath sounds: Normal breath sounds.  Abdominal:     Comments: Firm. Palpable hepatosplenomegaly  Skin:    General: Skin is warm and dry.  Neurological:     Mental Status: He is alert and oriented to person, place, and time.      CMP Latest Ref Rng & Units 08/06/2018  Glucose 70 - 99 mg/dL 73  BUN 6 - 20 mg/dL  9  Creatinine 0.61 - 1.24 mg/dL 0.67  Sodium 135 - 145 mmol/L 135  Potassium 3.5 - 5.1 mmol/L 3.6  Chloride 98 - 111 mmol/L 101  CO2 22 - 32 mmol/L 23  Calcium 8.9 - 10.3 mg/dL 6.8(L)  Total Protein 6.5 - 8.1 g/dL 4.7(L)  Total Bilirubin 0.3 - 1.2 mg/dL 2.2(H)  Alkaline Phos 38 - 126 U/L 450(H)  AST 15 - 41 U/L 291(H)  ALT 0 - 44 U/L 108(H)   CBC Latest Ref Rng & Units 08/06/2018  WBC 4.0 - 10.5 K/uL 6.3  Hemoglobin 13.0 - 17.0 g/dL 14.3  Hematocrit 39.0 - 52.0 % 42.6  Platelets 150 - 400 K/uL 23(LL)    _0 @  Dg Chest 2 View  Result Date: 08/03/2018 CLINICAL DATA:  Two-day history of wheezing, nausea and vomiting. Current smoker. EXAM: CHEST - 2 VIEW COMPARISON:  01/24/2017 and earlier. FINDINGS: Cardiomediastinal silhouette unremarkable and unchanged. Mildly prominent bronchovascular markings and mild central peribronchial thickening, unchanged over multiple prior examinations. Lungs otherwise clear. No localized airspace consolidation. No pleural effusions. No pneumothorax. Normal pulmonary vascularity. Visualized bony thorax intact. IMPRESSION: Stable mild changes of chronic bronchitis and/or asthma. No acute cardiopulmonary disease. Electronically Signed   By: Evangeline Dakin M.D.   On: 08/03/2018 18:52   Ct Chest W Contrast  Result Date: 08/04/2018 CLINICAL DATA:   Nausea and upper abdominal pain. 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 EXAM: CT CHEST WITH CONTRAST TECHNIQUE: Multidetector CT imaging of the chest was performed during intravenous contrast administration. CONTRAST:  40m OMNIPAQUE IOHEXOL 300 MG/ML  SOLN COMPARISON:  CT scan of the abdomen and pelvis August 03, 2018 FINDINGS: Cardiovascular: The heart size is normal. The thoracic aorta is nonaneurysmal with no dissection or atherosclerotic changes. Central pulmonary arteries are normal in caliber. No coronary artery calcifications are identified. Mediastinum/Nodes: Adenopathy in the axilla, right greater than left. A representative node in the right axilla on series 2, image 40 measures 2.1 cm. Retro pectoral adenopathy is seen on the right is well. No retropectoral adenopathy on the left. There is a mildly prominent epicardial node on series 2, image 120 measuring 8 mm, not definitely abnormal. A retrocrural node measures 11 mm on series 2, image 75, borderline. A few other shotty nodes are seen in the mediastinum without gross mediastinal adenopathy. No hilar adenopathy is identified. Lungs/Pleura: Central airways are normal. Paraseptal emphysematous changes in the apices, right greater than left. No pneumothorax. No suspicious nodules or masses. Dependent atelectasis is seen in the bases. There is a tiny left pleural effusion. Upper Abdomen: Splenomegaly is again identified. The patient's known splenic infarcts are not well assessed without contrast today. No other abnormalities in the upper abdomen. Musculoskeletal: No chest wall abnormality. No acute or significant osseous findings. IMPRESSION: 1. There is adenopathy in the bilateral axilla, right much greater than left. There is also retropectoral adenopathy on the right. Neoplastic and reactive/infectious/inflammatory causes are possible. One of the right axillary nodes should be amenable to ultrasound-guided biopsy if clinically warranted.  If biopsy is not pursued, recommend close follow-up. 2. Splenomegaly. The known splenic infarcts are not well assessed on this unenhanced study. 3. Mild paraseptal emphysematous changes in the apices. Emphysema (ICD10-J43.9). Electronically Signed   By: DDorise BullionIII M.D   On: 08/04/2018 13:40   Ct Abdomen Pelvis W Contrast  Result Date: 08/06/2018 CLINICAL DATA:  38year old male with hepatosplenomegaly and recent biopsy of enlarged axillary lymph nodes. Patient continues to  have abdominal pain with distended abdomen. EXAM: CT ABDOMEN AND PELVIS WITH CONTRAST TECHNIQUE: Multidetector CT imaging of the abdomen and pelvis was performed using the standard protocol following bolus administration of intravenous contrast. CONTRAST:  14m OMNIPAQUE IOHEXOL 300 MG/ML  SOLN COMPARISON:  CT of the abdomen pelvis dated 08/03/2018 FINDINGS: Lower chest: There are small bilateral pleural effusions, new since the prior CT. Minimal bibasilar dependent atelectatic changes noted. No intra-abdominal free air. Diffuse mesenteric edema and small ascites, increase in size since the prior CT. Hepatobiliary: The liver is enlarged measuring approximately 22 cm in midclavicular length. There is slight heterogeneous enhancement of the liver. No intrahepatic biliary ductal dilatation. Cholecystectomy. Pancreas: Unremarkable. No pancreatic ductal dilatation or surrounding inflammatory changes. Spleen: The spleen is enlarged with multiple areas of infarct as seen on the prior CT. The spleen measures approximately 22 cm in anterior-posterior dimension and 20 cm in craniocaudal length similar to prior CT. Adrenals/Urinary Tract: The adrenal glands are unremarkable. There is no hydronephrosis on either side head the symmetric enhancement and excretion of contrast by both kidneys. 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 The urinary bladder is distended and grossly unremarkable. Stomach/Bowel: Thickened and edematous  appearance of the ascending and proximal transverse colon may be related to ascites. Colitis is considered less likely. Clinical correlation is recommended. There is no bowel obstruction. The appendix is normal. Vascular/Lymphatic: The abdominal aorta and IVC are unremarkable. No portal venous gas. There is no adenopathy. Reproductive: The prostate and seminal vesicles are grossly unremarkable. No pelvic mass. Other: Diffuse subcutaneous edema and anasarca new since the prior CT. Musculoskeletal: No acute or significant osseous findings. IMPRESSION: 1. Hepatosplenomegaly with multiple areas of splenic infarct as seen on the prior CT. 2. Small bilateral pleural effusions, small ascites, diffuse mesenteric edema and anasarca new since the prior CT. 3. Thickened and edematous appearance of the ascending and proximal transverse colon may be related to ascites. Colitis is considered less likely. Clinical correlation is recommended. No bowel obstruction. Normal appendix. Electronically Signed   By: AAnner CreteM.D.   On: 08/06/2018 00:54   Ct Abdomen Pelvis W Contrast  Result Date: 08/03/2018 CLINICAL DATA:  38year old male with abdominal pain and vomiting. EXAM: CT ABDOMEN AND PELVIS WITH CONTRAST TECHNIQUE: Multidetector CT imaging of the abdomen and pelvis was performed using the standard protocol following bolus administration of intravenous contrast. CONTRAST:  1071mOMNIPAQUE IOHEXOL 300 MG/ML  SOLN COMPARISON:  CT of the abdomen pelvis dated 09/13/2016 FINDINGS: Lower chest: The visualized lung bases are clear. No intra-abdominal free air. There is a small free fluid within the pelvis. Hepatobiliary: The liver is enlarged measuring approximately 20 cm in midclavicular length. There is diffuse fatty infiltration of the liver. No intrahepatic biliary ductal dilatation. Cholecystectomy. No retained calcified stone identified in the central CBD. Pancreas: Unremarkable. No pancreatic ductal dilatation or  surrounding inflammatory changes. Spleen: The spleen is enlarged measuring approximately 20 cm in craniocaudal length and 22 cm in AP length. 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 Adrenals/Urinary Tract: The adrenal glands are unremarkable. There is no hydronephrosis on either side. The visualized ureters and urinary bladder appear unremarkable. Stomach/Bowel: There is no bowel obstruction or active inflammation. The appendix is normal. Vascular/Lymphatic: The abdominal aorta and IVC are unremarkable. No portal venous gas. There is no adenopathy. Reproductive: The prostate and seminal vesicles are grossly unremarkable. No pelvic mass. Other: There is paucity of subcutaneous fat. There is  diffuse subcutaneous edema. Musculoskeletal: No acute or significant osseous findings. IMPRESSION: 1. Hepatosplenomegaly with diffuse fatty infiltration of the liver. Significant enlargement of the spleen, new since the prior CT with multiple areas of splenic infarct. 2. No bowel obstruction or active inflammation. Normal appendix. 3. Small free fluid within the pelvis and mild diffuse subcutaneous edema. Electronically Signed   By: Anner Crete M.D.   On: 08/03/2018 20:12   Ct Bone Marrow Biopsy & Aspiration  Result Date: 08/06/2018 CLINICAL DATA:  Lymphadenopathy and hepatosplenomegaly. Bone marrow biopsy required for further workup. EXAM: CT GUIDED BONE MARROW ASPIRATION AND BIOPSY ANESTHESIA/SEDATION: Versed 3.0 mg IV, Fentanyl 75 mcg IV Total Moderate Sedation Time:   12 minutes. The patient's level of consciousness and physiologic status were continuously monitored during the procedure by Radiology nursing. PROCEDURE: The procedure risks, benefits, and alternatives were explained to the patient. Questions regarding the procedure were encouraged and answered. The patient understands and consents to the procedure. A time out was performed prior to  initiating the procedure. The right gluteal region was prepped with chlorhexidine. Sterile gown and sterile gloves were used for the procedure. Local anesthesia was provided with 1% Lidocaine. Under CT guidance, an 11 gauge On Control bone cutting needle was advanced from a posterior approach into the right iliac bone. Needle positioning was confirmed with CT. Initial non heparinized and heparinized aspirate samples were obtained of bone marrow. Core biopsy was performed via the On Control drill needle. COMPLICATIONS: None FINDINGS: Inspection of initial aspirate did reveal visible particles. Intact core biopsy sample was obtained. IMPRESSION: CT guided bone marrow biopsy of right posterior iliac bone with both aspirate and core samples obtained. Electronically Signed   By: Aletta Edouard M.D.   On: 08/06/2018 12:01   US Liver Doppler  Result Date: 08/05/2018 CLINICAL DATA:  Abnormal liver function tests. EXAM: DUPLEX ULTRASOUND OF LIVER TECHNIQUE: Color and duplex Doppler ultrasound was performed to evaluate the hepatic in-flow and out-flow vessels. COMPARISON:  CT scan of August 03, 2018. FINDINGS: Liver: Increased echogenicity of hepatic parenchyma is noted. Normal hepatic contour without nodularity. No focal lesion, mass or intrahepatic biliary ductal dilatation. Main Portal Vein size: 1.73 cm Portal Vein Velocities Main Prox:  41.1 cm/sec Main Mid: 28.3 cm/sec Main Dist:  35.6 cm/sec Right: 21.4 cm/sec Left: 24.9 cm/sec Normal hepatopetal flow is noted in the portal veins. Hepatic Vein Velocities Right:  41.1 cm/sec Middle:  40.6 cm/sec Left:  34.9 cm/sec Normal hepatofugal flow is noted in the hepatic vein. IVC: Present and patent with normal respiratory phasicity. Hepatic Artery Velocity:  103.2 cm/sec Splenic Vein Velocity:  22 cm/sec Spleen: 16.25 cm x 20.7 cm x 10.63 cm with a total volume of 1872 cm^3 (411 cm^3 is upper limit normal) Portal Vein Occlusion/Thrombus: No Splenic Vein Occlusion/Thrombus:  No Ascites: None Varices: None IMPRESSION: No evidence of portal, hepatic or splenic venous thrombosis or occlusion. Increased echogenicity of hepatic parenchyma is noted suggesting hepatic steatosis. Severe splenomegaly is noted. Electronically Signed   By: Marijo Conception M.D.   On: 08/05/2018 12:13   [place] Core Biopsy (lymph Nodes)  Result Date: 08/05/2018 INDICATION: Lymphadenopathy, particularly in the right axillary region. Hepatosplenomegaly and splenic infarcts. EXAM: ULTRASOUND GUIDED CORE BIOPSY OF RIGHT AXILLARY LYMPH NODE MEDICATIONS: None. ANESTHESIA/SEDATION: None PROCEDURE: The procedure, risks, benefits, and alternatives were explained to the patient. Questions regarding the procedure were encouraged and answered. The patient understands and consents to the procedure. A time-out was performed prior to initiating the  procedure. The right axilla was prepped with chlorhexidine in a sterile fashion, and a sterile drape was applied covering the operative field. A sterile gown and sterile gloves were used for the procedure. Local anesthesia was provided with 1% Lidocaine. Ultrasound was performed to localize right axillary lymph nodes. After choosing a lymph node for biopsy, a 16 gauge core biopsy device was utilized in obtaining 4 separate core biopsy samples of an enlarged lymph node. Core biopsy samples were submitted on Telfa gauze soaked with sterile saline. COMPLICATIONS: None immediate. FINDINGS: There are multiple enlarged right axillary lymph nodes. The largest measures approximately 3.5 x 1.9 x 1.8 cm. Solid tissue was obtained from this lymph node. IMPRESSION: Ultrasound-guided core biopsy performed of an enlarged right axillary lymph node measuring 3.5 cm in greatest dimension. Electronically Signed   By: Aletta Edouard M.D.   On: 08/05/2018 11:40     Assessment and plan- Patient is a 38 y.o. male with history of alcohol abuse as well as IV drug abuse in the past admitted for severe  hepatosplenomegaly and splenic infarcts as well as thrombocytopenia  1.  Hepatosplenomegaly: Lymphoma remains high on the differential especially given palpable axillary adenopathy.  We are currently awaiting the results of lymph node biopsy as well as bone marrow biopsy to give [place] more answers.  Significantly elevated LDH also points out towards a malignant etiology.  HIV, hepatitis B, hepatitis C as well as vasculitis work-up negative.  Jak 2 and BCR ABL testing pending.  There remains a concern for splenic rupture given his massive splenomegaly.  Surgery on board.  No acute need for surgery at this time.  However if patient develops an acute abdomen or becomes hemodynamically stable will consider reimaging to rule out rupture.  Without knowing the etiology of hepatosplenomegaly-no treatment can be given at this time.  Infectious work-up so far has been unremarkable.  Fungal etiologies have been ordered and are currently pending. I will start him on prednisone 100 mg daily to cover possible lymphoma which is the most likely clinical possibility. His LFT's bilirubin continue to go up and I am hoping steroids will provide some symptomatic relief while we wait for marrow results to be back.  2.  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  TEE was negative for embolism.  Protein C levels were mildly low at 43%.  Protein S and protein C activity pending.  Antiphospholipid antibody syndrome work-up was negative.  3.  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  CBC from today is pending.  Continue supportive care and consider platelet transfusion if there is evidence of clinical bleeding or platelets are less than 10.  Okay to keep patient on prophylactic anticoagulation as long as platelet counts are greater than 25.  4.  Abnormal LFTs: Likely secondary to hepatomegaly possibly in the setting of malignancy.   Await lymph node/bone marrow biopsy as above.  Monitor LFTs and PT/INR daily   Visit Diagnosis 1. Intractable vomiting with nausea, unspecified vomiting type   2. Dehydration   3. Pain of upper abdomen   4. Splenic infarction   5. Abnormal LFTs   6. Lymphadenopathy, axillary   7. Splenomegaly      Dr. Randa Evens, MD, MPH [facility] [phone] 08/07/2018 8:20 AM

## 2018-08-08 ENCOUNTER — Other Ambulatory Visit (INDEPENDENT_AMBULATORY_CARE_PROVIDER_SITE_OTHER): Payer: Self-pay | Admitting: Vascular Surgery

## 2018-08-08 DIAGNOSIS — N289 Disorder of kidney and ureter, unspecified: Secondary | ICD-10-CM

## 2018-08-08 DIAGNOSIS — F1010 Alcohol abuse, uncomplicated: Secondary | ICD-10-CM

## 2018-08-08 DIAGNOSIS — M359 Systemic involvement of connective tissue, unspecified: Secondary | ICD-10-CM

## 2018-08-08 DIAGNOSIS — F1210 Cannabis abuse, uncomplicated: Secondary | ICD-10-CM

## 2018-08-08 DIAGNOSIS — D571 Sickle-cell disease without crisis: Secondary | ICD-10-CM

## 2018-08-08 DIAGNOSIS — C9000 Multiple myeloma not having achieved remission: Secondary | ICD-10-CM

## 2018-08-08 DIAGNOSIS — F17200 Nicotine dependence, unspecified, uncomplicated: Secondary | ICD-10-CM

## 2018-08-08 DIAGNOSIS — R109 Unspecified abdominal pain: Secondary | ICD-10-CM

## 2018-08-08 DIAGNOSIS — F1110 Opioid abuse, uncomplicated: Secondary | ICD-10-CM

## 2018-08-08 DIAGNOSIS — E119 Type 2 diabetes mellitus without complications: Secondary | ICD-10-CM

## 2018-08-08 DIAGNOSIS — R161 Splenomegaly, not elsewhere classified: Secondary | ICD-10-CM

## 2018-08-08 DIAGNOSIS — F1410 Cocaine abuse, uncomplicated: Secondary | ICD-10-CM

## 2018-08-08 DIAGNOSIS — I10 Essential (primary) hypertension: Secondary | ICD-10-CM

## 2018-08-08 DIAGNOSIS — R1115 Cyclical vomiting syndrome unrelated to migraine: Secondary | ICD-10-CM

## 2018-08-08 DIAGNOSIS — I509 Heart failure, unspecified: Secondary | ICD-10-CM

## 2018-08-08 LAB — IMMUNOGLOBULINS A/E/G/M, SERUM
IgA: 196 mg/dL (ref 90–386)
IgE (Immunoglobulin E), Serum: 82 IU/mL (ref 6–495)
IgG (Immunoglobin G), Serum: 725 mg/dL (ref 603–1613)
IgM (Immunoglobulin M), Srm: 117 mg/dL (ref 20–172)

## 2018-08-08 LAB — CBC WITH DIFFERENTIAL/PLATELET
Abs Immature Granulocytes: 0.11 10*3/uL — ABNORMAL HIGH (ref 0.00–0.07)
Basophils Absolute: 0 10*3/uL (ref 0.0–0.1)
Basophils Relative: 0 %
Eosinophils Absolute: 0 10*3/uL (ref 0.0–0.5)
Eosinophils Relative: 0 %
HCT: 38.5 % — ABNORMAL LOW (ref 39.0–52.0)
Hemoglobin: 13.3 g/dL (ref 13.0–17.0)
Immature Granulocytes: 1 %
Lymphocytes Relative: 12 %
Lymphs Abs: 1 10*3/uL (ref 0.7–4.0)
MCH: 30.5 pg (ref 26.0–34.0)
MCHC: 34.5 g/dL (ref 30.0–36.0)
MCV: 88.3 fL (ref 80.0–100.0)
Monocytes Absolute: 0.2 10*3/uL (ref 0.1–1.0)
Monocytes Relative: 2 %
Neutro Abs: 6.8 10*3/uL (ref 1.7–7.7)
Neutrophils Relative %: 85 %
Platelets: 17 10*3/uL — CL (ref 150–400)
RBC: 4.36 MIL/uL (ref 4.22–5.81)
RDW: 15.3 % (ref 11.5–15.5)
Smear Review: NORMAL
WBC: 8.1 10*3/uL (ref 4.0–10.5)
nRBC: 0.4 % — ABNORMAL HIGH (ref 0.0–0.2)

## 2018-08-08 LAB — ALPHA-1 ANTITRYPSIN PHENOTYPE: A-1 Antitrypsin, Ser: 146 mg/dL (ref 95–164)

## 2018-08-08 LAB — COMPREHENSIVE METABOLIC PANEL
ALT: 82 U/L — ABNORMAL HIGH (ref 0–44)
AST: 186 U/L — ABNORMAL HIGH (ref 15–41)
Albumin: 2.6 g/dL — ABNORMAL LOW (ref 3.5–5.0)
Alkaline Phosphatase: 626 U/L — ABNORMAL HIGH (ref 38–126)
Anion gap: 9 (ref 5–15)
BUN: 15 mg/dL (ref 6–20)
CO2: 23 mmol/L (ref 22–32)
Calcium: 7.6 mg/dL — ABNORMAL LOW (ref 8.9–10.3)
Chloride: 99 mmol/L (ref 98–111)
Creatinine, Ser: 0.61 mg/dL (ref 0.61–1.24)
GFR calc Af Amer: >60 mL/min (ref >60)
GFR calc non Af Amer: >60 mL/min (ref >60)
Glucose, Bld: 155 mg/dL — ABNORMAL HIGH (ref 70–99)
Potassium: 4.7 mmol/L (ref 3.5–5.1)
Sodium: 131 mmol/L — ABNORMAL LOW (ref 135–145)
Total Bilirubin: 1.8 mg/dL — ABNORMAL HIGH (ref 0.3–1.2)
Total Protein: 4.6 g/dL — ABNORMAL LOW (ref 6.5–8.1)

## 2018-08-08 LAB — JAK2 GENOTYPR

## 2018-08-08 LAB — CULTURE, BLOOD (ROUTINE X 2)
Culture: NO GROWTH
Culture: NO GROWTH
Special Requests: ADEQUATE

## 2018-08-08 LAB — CREATININE, SERUM
Creatinine, Ser: 0.58 mg/dL — ABNORMAL LOW (ref 0.61–1.24)
GFR calc Af Amer: >60 mL/min (ref >60)
GFR calc non Af Amer: >60 mL/min (ref >60)

## 2018-08-08 MED ORDER — SODIUM CHLORIDE 0.9 % IV SOLN: INTRAVENOUS | Status: DC

## 2018-08-08 MED ORDER — PREDNISONE 50 MG PO TABS: ORAL_TABLET | ORAL | 0 refills | Status: AC

## 2018-08-08 MED ORDER — PANTOPRAZOLE SODIUM 40 MG PO TBEC: 40.0000 mg | DELAYED_RELEASE_TABLET | Freq: Two times a day (BID) | ORAL | 0 refills | Status: AC

## 2018-08-08 MED ORDER — ALPRAZOLAM 0.25 MG PO TABS: 0.2500 mg | ORAL_TABLET | Freq: Three times a day (TID) | ORAL | 0 refills | Status: AC | PRN

## 2018-08-08 MED ORDER — HYDROCODONE-ACETAMINOPHEN 5-325 MG PO TABS: 1.0000 | ORAL_TABLET | ORAL | 0 refills | Status: AC | PRN

## 2018-08-08 NOTE — Progress Notes (Signed)
Hematology/Oncology Consult note [facility]  Telephone:([phone] [phone]  Patient Care Team: Albina Billet, MD as PCP - General (Internal Medicine)   Name of the patient: Dakota Bowen  [id number]  [birth year]   Date of visit: 08/08/2018    Interval history-continues to have significant abdominal pain but is a little improved as compared to yesterday.  Still has some persistent nausea but is able to eat  Review of systems- Review of Systems  Constitutional: Positive for malaise/fatigue and weight loss. Negative for chills and fever.  HENT: Negative for congestion, ear discharge and nosebleeds.   Eyes: Negative for blurred vision.  Respiratory: Negative for cough, hemoptysis, sputum production, shortness of breath and wheezing.   Cardiovascular: Negative for chest pain, palpitations, orthopnea and claudication.  Gastrointestinal: Positive for abdominal pain and nausea. Negative for blood in stool, constipation, diarrhea, heartburn, melena and vomiting.  Genitourinary: Negative for dysuria, flank pain, frequency, hematuria and urgency.  Musculoskeletal: Negative for back pain, joint pain and myalgias.  Skin: Negative for rash.  Neurological: Negative for dizziness, tingling, focal weakness, seizures, weakness and headaches.  Endo/Heme/Allergies: Does not bruise/bleed easily.  Psychiatric/Behavioral: Negative for depression and suicidal ideas. The patient does not have insomnia.       Allergies  Allergen Reactions   Penicillins Itching, Rash and Other (See Comments)         Past Medical History:  Diagnosis Date   Cyclical vomiting syndrome    GERD (gastroesophageal reflux disease)    GERD (gastroesophageal reflux disease)      Past Surgical History:  Procedure Laterality Date   CHOLECYSTECTOMY     ESOPHAGOGASTRODUODENOSCOPY (EGD) WITH PROPOFOL N/A 06/21/2014   Procedure: ESOPHAGOGASTRODUODENOSCOPY (EGD) WITH PROPOFOL.  Looking in the esophagus stomach and upper small intestine with a lighted tube to examine and treat.;  Surgeon: Lollie Sails, MD;  Location: [facility] ENDOSCOPY;  Service: Endoscopy;  Laterality: N/A;   TEE WITHOUT CARDIOVERSION N/A 08/05/2018   Procedure: TRANSESOPHAGEAL ECHOCARDIOGRAM (TEE);  Surgeon: Corey Skains, MD;  Location: [facility] ORS;  Service: Cardiovascular;  Laterality: N/A;    Social History   Socioeconomic History   Marital status: Single    Spouse name: Not on file   Number of children: Not on file   Years of education: Not on file   Highest education level: Not on file  Occupational History   Not on file  Social Needs   Financial resource strain: Not on file   Food insecurity    Worry: Not on file    Inability: Not on file   Transportation needs    Medical: Not on file    Non-medical: Not on file  Tobacco Use   Smoking status: Current Every Day Smoker    Packs/day: 1.50   Smokeless tobacco: Never Used  Substance and Sexual Activity   Alcohol use: Yes    Comment: 3-4beers/day   Drug use: Yes    Types: Marijuana, Cocaine, Heroin   Sexual activity: Not on file  Lifestyle   Physical activity    Days per week: Not on file    Minutes per session: Not on file   Stress: Not on file  Relationships   Social connections    Talks on phone: Not on file    Gets together: Not on file    Attends religious service: Not on file    Active member of club or organization: Not on file    Attends meetings of clubs or  organizations: Not on file    Relationship status: Not on file   Intimate partner violence    Fear of current or ex partner: Not on file    Emotionally abused: Not on file    Physically abused: Not on file    Forced sexual activity: Not on file  Other Topics Concern   Not on file  Social History Narrative   Not on file    Family History  Problem Relation Age of Onset   Diabetes Mother    Cancer Mother    Diabetes Father       Current Facility-Administered Medications:    [START ON 08/11/2018] 0.9 %  sodium chloride infusion, , Intravenous, Continuous, Stegmayer, Kimberly A, PA-C   acetaminophen (TYLENOL) tablet 650 mg, 650 mg, Oral, Q6H PRN **OR** acetaminophen (TYLENOL) suppository 650 mg, 650 mg, Rectal, Q6H PRN, Seals, Theo Dills, NP   ALPRAZolam Duanne Moron) tablet 0.25 mg, 0.25 mg, Oral, TID PRN, Fritzi Mandes, MD, 0.25 mg at 08/08/18 0702   alum & mag hydroxide-simeth (MAALOX/MYLANTA) 200-200-20 MG/5ML suspension 30 mL, 30 mL, Oral, Q6H PRN, Seals, Angela H, NP   enoxaparin (LOVENOX) injection 40 mg, 40 mg, Subcutaneous, Q24H, Brahmanday, Govinda R, MD, 40 mg at 08/06/18 2039   feeding supplement (ENSURE ENLIVE) (ENSURE ENLIVE) liquid 237 mL, 237 mL, Oral, TID BM, Fritzi Mandes, MD   HYDROcodone-acetaminophen (NORCO/VICODIN) 5-325 MG per tablet 1-2 tablet, 1-2 tablet, Oral, Q4H PRN, Fritzi Mandes, MD, 2 tablet at 08/08/18 0830   morphine 2 MG/ML injection 2 mg, 2 mg, Intravenous, Q3H PRN, Fritzi Mandes, MD, 2 mg at 08/08/18 1050   multivitamin with minerals tablet 1 tablet, 1 tablet, Oral, Daily, Fritzi Mandes, MD, 1 tablet at 08/08/18 0816   nicotine (NICODERM CQ - dosed in mg/24 hours) patch 21 mg, 21 mg, Transdermal, Daily, Fritzi Mandes, MD, 21 mg at 08/07/18 2112   [DISCONTINUED] ondansetron (ZOFRAN) tablet 4 mg, 4 mg, Oral, Q6H PRN **OR** ondansetron (ZOFRAN) injection 4 mg, 4 mg, Intravenous, Q4H PRN, Fritzi Mandes, MD, 4 mg at 08/07/18 0604   pantoprazole (PROTONIX) EC tablet 40 mg, 40 mg, Oral, BID AC, Fritzi Mandes, MD, 40 mg at 08/08/18 0816   polyethylene glycol (MIRALAX / GLYCOLAX) packet 17 g, 17 g, Oral, Daily PRN, Seals, Angela H, NP   predniSONE (DELTASONE) tablet 100 mg, 100 mg, Oral, Q breakfast, Sindy Guadeloupe, MD, 100 mg at 08/08/18 0816   promethazine (PHENERGAN) injection 12.5 mg, 12.5 mg, Intravenous, Q6H PRN, Fritzi Mandes, MD, 12.5 mg at 08/08/18 1101   simethicone (MYLICON) chewable tablet  80 mg, 80 mg, Oral, TID PRN, Seals, Angela H, NP, 80 mg at 08/07/18 1840   vitamin C (ASCORBIC ACID) tablet 500 mg, 500 mg, Oral, BID, Fritzi Mandes, MD, 500 mg at 08/08/18 0816  Physical exam:  Vitals:   08/07/18 2007 08/08/18 0356 08/08/18 1100 08/08/18 1211  BP: 108/75 105/76  111/75  Pulse: 85 68  70  Resp: 20 20  20   Temp: 98.8 F (37.1 C) 98.3 F (36.8 C)  98.1 F (36.7 C)  TempSrc: Oral Oral  Oral  SpO2: 98% 98% 98% 98%  Weight:      Height:       Physical Exam Constitutional:      Comments: Appears comfortable and in mild distress from abdominal pain  HENT:     Head: Normocephalic and atraumatic.  Eyes:     Pupils: Pupils are equal, round, and reactive to light.  Neck:  Musculoskeletal: Normal range of motion.  Cardiovascular:     Rate and Rhythm: Normal rate and regular rhythm.     Heart sounds: Normal heart sounds.  Pulmonary:     Effort: Pulmonary effort is normal.     Breath sounds: Normal breath sounds.  Abdominal:     Tenderness: There is guarding.     Comments: Palpable hepatosplenomegaly severe  Skin:    General: Skin is warm and dry.  Neurological:     Mental Status: He is alert and oriented to person, place, and time.      CMP Latest Ref Rng & Units 08/08/2018  Glucose 70 - 99 mg/dL 155(H)  BUN 6 - 20 mg/dL 15  Creatinine 0.61 - 1.24 mg/dL 0.61  Sodium 135 - 145 mmol/L 131(L)  Potassium 3.5 - 5.1 mmol/L 4.7  Chloride 98 - 111 mmol/L 99  CO2 22 - 32 mmol/L 23  Calcium 8.9 - 10.3 mg/dL 7.6(L)  Total Protein 6.5 - 8.1 g/dL 4.6(L)  Total Bilirubin 0.3 - 1.2 mg/dL 1.8(H)  Alkaline Phos 38 - 126 U/L 626(H)  AST 15 - 41 U/L 186(H)  ALT 0 - 44 U/L 82(H)   CBC Latest Ref Rng & Units 08/08/2018  WBC 4.0 - 10.5 K/uL 8.1  Hemoglobin 13.0 - 17.0 g/dL 13.3  Hematocrit 39.0 - 52.0 % 38.5(L)  Platelets 150 - 400 K/uL 17(LL)    @IMAGES @  Dg Chest 2 View  Result Date: 08/03/2018 CLINICAL DATA:  Two-day history of wheezing, nausea and vomiting.  Current smoker. EXAM: CHEST - 2 VIEW COMPARISON:  01/24/2017 and earlier. FINDINGS: Cardiomediastinal silhouette unremarkable and unchanged. Mildly prominent bronchovascular markings and mild central peribronchial thickening, unchanged over multiple prior examinations. Lungs otherwise clear. No localized airspace consolidation. No pleural effusions. No pneumothorax. Normal pulmonary vascularity. Visualized bony thorax intact. IMPRESSION: Stable mild changes of chronic bronchitis and/or asthma. No acute cardiopulmonary disease. Electronically Signed   By: Evangeline Dakin M.D.   On: 08/03/2018 18:52   Ct Chest W Contrast  Result Date: 08/04/2018 CLINICAL DATA:  Nausea and upper abdominal pain. 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 EXAM: CT CHEST WITH CONTRAST TECHNIQUE: Multidetector CT imaging of the chest was performed during intravenous contrast administration. CONTRAST:  70m OMNIPAQUE IOHEXOL 300 MG/ML  SOLN COMPARISON:  CT scan of the abdomen and pelvis August 03, 2018 FINDINGS: Cardiovascular: The heart size is normal. The thoracic aorta is nonaneurysmal with no dissection or atherosclerotic changes. Central pulmonary arteries are normal in caliber. No coronary artery calcifications are identified. Mediastinum/Nodes: Adenopathy in the axilla, right greater than left. A representative node in the right axilla on series 2, image 40 measures 2.1 cm. Retro pectoral adenopathy is seen on the right is well. No retropectoral adenopathy on the left. There is a mildly prominent epicardial node on series 2, image 120 measuring 8 mm, not definitely abnormal. A retrocrural node measures 11 mm on series 2, image 75, borderline. A few other shotty nodes are seen in the mediastinum without gross mediastinal adenopathy. No hilar adenopathy is identified. Lungs/Pleura: Central airways are normal. Paraseptal emphysematous changes in the apices, right greater than left. No pneumothorax. No suspicious nodules  or masses. Dependent atelectasis is seen in the bases. There is a tiny left pleural effusion. Upper Abdomen: Splenomegaly is again identified. The patient's known splenic infarcts are not well assessed without contrast today. No other abnormalities in the upper abdomen. Musculoskeletal: No chest wall abnormality. No acute or significant osseous  findings. IMPRESSION: 1. There is adenopathy in the bilateral axilla, right much greater than left. There is also retropectoral adenopathy on the right. Neoplastic and reactive/infectious/inflammatory causes are possible. One of the right axillary nodes should be amenable to ultrasound-guided biopsy if clinically warranted. If biopsy is not pursued, recommend close follow-up. 2. Splenomegaly. The known splenic infarcts are not well assessed on this unenhanced study. 3. Mild paraseptal emphysematous changes in the apices. Emphysema (ICD10-J43.9). Electronically Signed   By: Dorise Bullion III M.D   On: 08/04/2018 13:40   Ct Abdomen Pelvis W Contrast  Result Date: 08/06/2018 CLINICAL DATA:  38 year old male with hepatosplenomegaly and recent biopsy of enlarged axillary lymph nodes. Patient continues to have abdominal pain with distended abdomen. EXAM: CT ABDOMEN AND PELVIS WITH CONTRAST TECHNIQUE: Multidetector CT imaging of the abdomen and pelvis was performed using the standard protocol following bolus administration of intravenous contrast. CONTRAST:  143m OMNIPAQUE IOHEXOL 300 MG/ML  SOLN COMPARISON:  CT of the abdomen pelvis dated 08/03/2018 FINDINGS: Lower chest: There are small bilateral pleural effusions, new since the prior CT. Minimal bibasilar dependent atelectatic changes noted. No intra-abdominal free air. Diffuse mesenteric edema and small ascites, increase in size since the prior CT. Hepatobiliary: The liver is enlarged measuring approximately 22 cm in midclavicular length. There is slight heterogeneous enhancement of the liver. No intrahepatic biliary  ductal dilatation. Cholecystectomy. Pancreas: Unremarkable. No pancreatic ductal dilatation or surrounding inflammatory changes. Spleen: The spleen is enlarged with multiple areas of infarct as seen on the prior CT. The spleen measures approximately 22 cm in anterior-posterior dimension and 20 cm in craniocaudal length similar to prior CT. Adrenals/Urinary Tract: The adrenal glands are unremarkable. There is no hydronephrosis on either side head the symmetric enhancement and excretion of contrast by both kidneys. 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 The urinary bladder is distended and grossly unremarkable. Stomach/Bowel: Thickened and edematous appearance of the ascending and proximal transverse colon may be related to ascites. Colitis is considered less likely. Clinical correlation is recommended. There is no bowel obstruction. The appendix is normal. Vascular/Lymphatic: The abdominal aorta and IVC are unremarkable. No portal venous gas. There is no adenopathy. Reproductive: The prostate and seminal vesicles are grossly unremarkable. No pelvic mass. Other: Diffuse subcutaneous edema and anasarca new since the prior CT. Musculoskeletal: No acute or significant osseous findings. IMPRESSION: 1. Hepatosplenomegaly with multiple areas of splenic infarct as seen on the prior CT. 2. Small bilateral pleural effusions, small ascites, diffuse mesenteric edema and anasarca new since the prior CT. 3. Thickened and edematous appearance of the ascending and proximal transverse colon may be related to ascites. Colitis is considered less likely. Clinical correlation is recommended. No bowel obstruction. Normal appendix. Electronically Signed   By: AAnner CreteM.D.   On: 08/06/2018 00:54   Ct Abdomen Pelvis W Contrast  Result Date: 08/03/2018 CLINICAL DATA:  38year old male with abdominal pain and vomiting. EXAM: CT ABDOMEN AND PELVIS WITH CONTRAST TECHNIQUE: Multidetector CT imaging of the abdomen and  pelvis was performed using the standard protocol following bolus administration of intravenous contrast. CONTRAST:  1071mOMNIPAQUE IOHEXOL 300 MG/ML  SOLN COMPARISON:  CT of the abdomen pelvis dated 09/13/2016 FINDINGS: Lower chest: The visualized lung bases are clear. No intra-abdominal free air. There is a small free fluid within the pelvis. Hepatobiliary: The liver is enlarged measuring approximately 20 cm in midclavicular length. There is diffuse fatty infiltration of the liver. No intrahepatic biliary ductal dilatation. Cholecystectomy. No retained calcified stone identified  in the central CBD. Pancreas: Unremarkable. No pancreatic ductal dilatation or surrounding inflammatory changes. Spleen: The spleen is enlarged measuring approximately 20 cm in craniocaudal length and 22 cm in AP length. 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 Adrenals/Urinary Tract: The adrenal glands are unremarkable. There is no hydronephrosis on either side. The visualized ureters and urinary bladder appear unremarkable. Stomach/Bowel: There is no bowel obstruction or active inflammation. The appendix is normal. Vascular/Lymphatic: The abdominal aorta and IVC are unremarkable. No portal venous gas. There is no adenopathy. Reproductive: The prostate and seminal vesicles are grossly unremarkable. No pelvic mass. Other: There is paucity of subcutaneous fat. There is diffuse subcutaneous edema. Musculoskeletal: No acute or significant osseous findings. IMPRESSION: 1. Hepatosplenomegaly with diffuse fatty infiltration of the liver. Significant enlargement of the spleen, new since the prior CT with multiple areas of splenic infarct. 2. No bowel obstruction or active inflammation. Normal appendix. 3. Small free fluid within the pelvis and mild diffuse subcutaneous edema. Electronically Signed   By: Anner Crete M.D.   On: 08/03/2018 20:12   Ct Bone Marrow Biopsy &  Aspiration  Result Date: 08/06/2018 CLINICAL DATA:  Lymphadenopathy and hepatosplenomegaly. Bone marrow biopsy required for further workup. EXAM: CT GUIDED BONE MARROW ASPIRATION AND BIOPSY ANESTHESIA/SEDATION: Versed 3.0 mg IV, Fentanyl 75 mcg IV Total Moderate Sedation Time:   12 minutes. The patient's level of consciousness and physiologic status were continuously monitored during the procedure by Radiology nursing. PROCEDURE: The procedure risks, benefits, and alternatives were explained to the patient. Questions regarding the procedure were encouraged and answered. The patient understands and consents to the procedure. A time out was performed prior to initiating the procedure. The right gluteal region was prepped with chlorhexidine. Sterile gown and sterile gloves were used for the procedure. Local anesthesia was provided with 1% Lidocaine. Under CT guidance, an 11 gauge On Control bone cutting needle was advanced from a posterior approach into the right iliac bone. Needle positioning was confirmed with CT. Initial non heparinized and heparinized aspirate samples were obtained of bone marrow. Core biopsy was performed via the On Control drill needle. COMPLICATIONS: None FINDINGS: Inspection of initial aspirate did reveal visible particles. Intact core biopsy sample was obtained. IMPRESSION: CT guided bone marrow biopsy of right posterior iliac bone with both aspirate and core samples obtained. Electronically Signed   By: Aletta Edouard M.D.   On: 08/06/2018 12:01   US Liver Doppler  Result Date: 08/05/2018 CLINICAL DATA:  Abnormal liver function tests. EXAM: DUPLEX ULTRASOUND OF LIVER TECHNIQUE: Color and duplex Doppler ultrasound was performed to evaluate the hepatic in-flow and out-flow vessels. COMPARISON:  CT scan of August 03, 2018. FINDINGS: Liver: Increased echogenicity of hepatic parenchyma is noted. Normal hepatic contour without nodularity. No focal lesion, mass or intrahepatic biliary ductal  dilatation. Main Portal Vein size: 1.73 cm Portal Vein Velocities Main Prox:  41.1 cm/sec Main Mid: 28.3 cm/sec Main Dist:  35.6 cm/sec Right: 21.4 cm/sec Left: 24.9 cm/sec Normal hepatopetal flow is noted in the portal veins. Hepatic Vein Velocities Right:  41.1 cm/sec Middle:  40.6 cm/sec Left:  34.9 cm/sec Normal hepatofugal flow is noted in the hepatic vein. IVC: Present and patent with normal respiratory phasicity. Hepatic Artery Velocity:  103.2 cm/sec Splenic Vein Velocity:  22 cm/sec Spleen: 16.25 cm x 20.7 cm x 10.63 cm with a total volume of 1872 cm^3 (411 cm^3 is upper limit normal) Portal Vein Occlusion/Thrombus: No Splenic Vein  Occlusion/Thrombus: No Ascites: None Varices: None IMPRESSION: No evidence of portal, hepatic or splenic venous thrombosis or occlusion. Increased echogenicity of hepatic parenchyma is noted suggesting hepatic steatosis. Severe splenomegaly is noted. Electronically Signed   By: Marijo Conception M.D.   On: 08/05/2018 12:13   [place] Core Biopsy (lymph Nodes)  Result Date: 08/05/2018 INDICATION: Lymphadenopathy, particularly in the right axillary region. Hepatosplenomegaly and splenic infarcts. EXAM: ULTRASOUND GUIDED CORE BIOPSY OF RIGHT AXILLARY LYMPH NODE MEDICATIONS: None. ANESTHESIA/SEDATION: None PROCEDURE: The procedure, risks, benefits, and alternatives were explained to the patient. Questions regarding the procedure were encouraged and answered. The patient understands and consents to the procedure. A time-out was performed prior to initiating the procedure. The right axilla was prepped with chlorhexidine in a sterile fashion, and a sterile drape was applied covering the operative field. A sterile gown and sterile gloves were used for the procedure. Local anesthesia was provided with 1% Lidocaine. Ultrasound was performed to localize right axillary lymph nodes. After choosing a lymph node for biopsy, a 16 gauge core biopsy device was utilized in obtaining 4 separate core  biopsy samples of an enlarged lymph node. Core biopsy samples were submitted on Telfa gauze soaked with sterile saline. COMPLICATIONS: None immediate. FINDINGS: There are multiple enlarged right axillary lymph nodes. The largest measures approximately 3.5 x 1.9 x 1.8 cm. Solid tissue was obtained from this lymph node. IMPRESSION: Ultrasound-guided core biopsy performed of an enlarged right axillary lymph node measuring 3.5 cm in greatest dimension. Electronically Signed   By: Aletta Edouard M.D.   On: 08/05/2018 11:40     Assessment and plan- Patient is a 38 y.o. 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  Severe hepatosplenomegaly: I got a prelim read on his bone marrow biopsy from hematology Dr. Melina Copa.  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  Per my conversation with Dr. 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  I have explained these findings to the patient and the need to transfer to a tertiary center for treatment of acute leukemia.  He has already been started on 100 mg of prednisone and today is day 2  2.  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  Given that his white count and hemoglobin are normal I doubt that his thrombocytopenia is secondary to decreased production in the bone marrow.  Continue to monitor  3.  Abnormal LFTs and significantly elevated LDH also secondary to leukemia.  I have spoken to Dr. Raylene Everts who will be the accepting physician at [facility] and patient will be going there for further treatment  I have spent greater than 40 minutes coordinating his care with primary team here, [facility] ([facility]) as well as hemato-pathology at [facility]    Visit Diagnosis 1. Intractable  vomiting with nausea, unspecified vomiting type   2. Dehydration   3. Pain of upper abdomen   4. Splenic infarction   5. Abnormal LFTs   6. Lymphadenopathy, axillary   7. Splenomegaly   8. Hepatosplenomegaly      Dr. Randa Evens, MD, MPH [facility] at [facility] [phone] 08/08/2018 1:52 PM

## 2018-08-08 NOTE — Plan of Care (Signed)
Patient's biopsy results came back as luekemia.  MD's decided to transfer him to [facility].  [facility] has accepted the patient and has a bed, we are now just waiting to figure out transportation.  Patient still having quite a bit of abdominal pain.  He has been given Xanax for his anxiety.  Tolerating his diet well.  Patient has been a little down and discouraged after receiving the diagnosis.  He has been able to sleep on and off during the day.

## 2018-08-08 NOTE — Progress Notes (Signed)
Patient ID: Dakota Bowen, male   DOB: September 27, 1980, 38 y.o.   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 Janese Banks. She was informed by pathologist Dr. Melina Copa.  Patient needs to be transferred to tertiary care center for treatment. He has been accepted at [facility]. Spoke with Dr. Martyn Ehrich on the phone and information provided. Accepting physician is Dr. Raylene Everts.  Book with patient he is agreeable with transfer. Questions answered.

## 2018-08-08 NOTE — Discharge Summary (Signed)
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 Cyclical vomiting syndrome   . GERD (gastroesophageal reflux disease)   . GERD (gastroesophageal reflux disease)     HOSPITAL COURSE:  Dakota Bowen a38 y.o.malewith a known history of cyclical vomiting syndrome and GERD. He presented to the emergency room reporting a 3-day history of intractable nausea and vomiting having become unable to eat or drink over the last 2 days without vomiting. He reports having had no urine output over the last 24 hours. He is also experiencing left upper abdominal pain which he describes as sharp and aching with a pain score 8-9 out of 10.  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 Quincy Sheehan hematology/oncology consulted for further evaluation and recommendations - TEE Negative for endocarditis -CT chest on admission shows significant lymphadenopathy..  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  -Pt is s/p (July 22 nd) H influenza, meningococcal and pneumococcal vaccines in prep for possible splenectomy.  -Pt is being transferred to [facility] for further management on his leukemia -patient has been started on oral prednisone 100 milligrams daily this is day two  2. intractable nausea vomiting with Dehydration--resolved -tolerating po diet some -dietitian to see -prn IV antiemetic  3. Elevated liver function studies with severe hepatomegaly -Hepatitis panel B and C negative -Gastroenterology Dr Georgeann Oppenheim input noted  -Liver doppler no Portal vein thrombosis -ceruloplasmin neg -Antismooth muscle antibody negative - varicella zoster PCR, immunoglobulin quantitative, alpha-1 and trypsin phenotype pending  4. Epigastric and upper abdominal pain due to splenic infacrts -Pain is being managed with IV analgesic - GERD with po Protonix  5. Thrombocytopenia likely secondary to liver injury/? Lymphoma -per Dr. Janese Banks peripheral smear does not showschistocytes -LDH elevated 1117--913 -Plt count 39K--27K--23 K--21--17K -transfuse plts as needed. No bleeding  discussed with patient. Patient's mother is aware of the transfer to [facility].  CONSULTS OBTAINED:  Treatment Team:  Sindy Guadeloupe, MD Jonathon Bellows, MD Herbert Pun, MD Algernon Huxley, MD  DRUG ALLERGIES:     DISCHARGE MEDICATIONS:     If you experience worsening of your admission symptoms, develop shortness of breath, life threatening emergency, suicidal or homicidal thoughts you must seek medical attention immediately by calling 911 or calling your MD immediately  if symptoms less severe.  You Must read complete instructions/literature along with all the possible adverse reactions/side effects for all the Medicines you take and that have been prescribed to you.  Take any new Medicines after you have completely understood and accept  all the possible adverse reactions/side effects.   Please note  You were cared for by a hospitalist during your hospital stay. If you have any questions about your discharge medications or the care you received while you were in the hospital after you are discharged, you can call the unit and asked to speak with the hospitalist on call if the hospitalist that took care of you is not available. Once you are discharged, your primary care physician will handle any further medical issues. Please note that NO REFILLS for any discharge medications will be authorized once you are discharged, as it is imperative that you return to your primary care physician (or establish a relationship with a primary care physician if you do not have one) for your aftercare needs so that they can reassess your need for medications and monitor your lab values.   DATA REVIEW:   CBC  Recent Labs  Lab 08/08/18 0928  WBC 8.1  HGB 13.3  HCT 38.5*  PLT 17*    Chemistries  Recent Labs  Lab 08/08/18 0928  NA 131*  K 4.7  CL 99  CO2 23  GLUCOSE 155*  BUN 15  CREATININE 0.61  CALCIUM 7.6*  AST 186*  ALT 82*  ALKPHOS 626*  BILITOT 1.8*    Microbiology Results   Recent Results (from the past 240 hour(s))  SARS Coronavirus 2 (CEPHEID- Performed in [place] hospital lab), Hosp Order     Status: None   Collection Time: 08/03/18  9:39 PM   Specimen: Nasopharyngeal Swab  Result Value Ref Range Status   SARS Coronavirus 2 NEGATIVE NEGATIVE Final    Comment: (NOTE) If result is NEGATIVE SARS-CoV-2 target nucleic acids are NOT DETECTED. The SARS-CoV-2 RNA is generally detectable in upper and lower  respiratory specimens during the acute phase of infection. The lowest  concentration of SARS-CoV-2 viral copies this assay can detect is 250  copies / mL. A negative result does not preclude SARS-CoV-2 infection  and should not be used as the  sole basis for treatment or other  patient management decisions.  A negative result may occur with  improper specimen collection / handling, submission of specimen other  than nasopharyngeal swab, presence of viral mutation(s) within the  areas targeted by this assay, and inadequate number of viral copies  (<250 copies / mL). A negative result must be combined with clinical  observations, patient history, and epidemiological information. If result is POSITIVE SARS-CoV-2 target nucleic acids are DETECTED. The SARS-CoV-2 RNA is generally detectable in upper and lower  respiratory specimens dur ing the acute phase of infection.  Positive  results are indicative of active infection with SARS-CoV-2.  Clinical  correlation with patient history and other diagnostic information is  necessary to determine patient infection status.  Positive results do  not rule out bacterial infection or co-infection with other viruses. If result is PRESUMPTIVE POSTIVE SARS-CoV-2 nucleic acids MAY BE PRESENT.   A presumptive positive result was obtained on the submitted specimen  and confirmed on repeat testing.  While 2019 novel coronavirus  (SARS-CoV-2) nucleic acids may be present in the submitted sample  additional confirmatory testing may be necessary for epidemiological  and / or clinical management purposes  to differentiate between  SARS-CoV-2 and other Sarbecovirus currently known to infect humans.  If clinically indicated additional testing with an alternate test  methodology [phone]) is advised. The SARS-CoV-2 RNA is generally  detectable in upper and lower respiratory sp ecimens during the acute  phase of infection. The expected result is Negative. Fact Sheet for Patients:  StrictlyIdeas.no Fact Sheet for Healthcare Providers: BankingDealers.co.za This test is not yet approved or cleared by the [place] FDA and has been authorized for detection  and/or diagnosis of SARS-CoV-2 by FDA under an Emergency Use Authorization (EUA).  This EUA will remain in effect (meaning this test can be used) for the duration of the COVID-19 declaration under Section 564(b)(1) of the Act, 21 U.S.C. section 360bbb-3(b)(1), unless the authorization is terminated or revoked sooner. Performed at [facility], [address] [place], [place] [address]   Culture, blood (routine x 2)     Status: None   Collection Time: 08/03/18 10:33 PM   Specimen: BLOOD  Result Value Ref Range Status   Specimen Description BLOOD RIGHT ANTECUBITAL  Final   Special Requests   Final    BOTTLES DRAWN AEROBIC AND ANAEROBIC Blood Culture adequate volume   Culture   Final    NO GROWTH 5 DAYS Performed at [facility], [address] [place], [place] [address]    Report Status 08/08/2018 FINAL  Final  Culture, blood (routine x 2)     Status: None   Collection Time: 08/03/18 10:33 PM   Specimen: BLOOD  Result Value Ref Range Status   Specimen Description BLOOD LEFT HAND  Final   Special Requests   Final    BOTTLES DRAWN AEROBIC AND ANAEROBIC Blood Culture results may not be optimal due to an inadequate volume of blood received in culture bottles   Culture   Final    NO GROWTH 5 DAYS Performed at [facility], [address] [place], [place] [address]    Report Status 08/08/2018 FINAL  Final    RADIOLOGY:  No results found.   CODE STATUS:     Code Status Orders  (From admission, onward)         Start     Ordered   08/04/18 0012  Full code  Continuous     08/04/18 0011        Code Status History    Date Active Date Inactive Code Status Order ID Comments User Context   10/28/2017 2311 10/29/2017 1724 Full Code [id number]  Amelia Jo, MD Inpatient   01/24/2017 1645 01/26/2017 2038 Full Code [id number]  Loletha Grayer, MD ED   02/24/2016 1452 03/01/2016 1646 Full Code [id number]  Vaughan Basta, MD Inpatient   05/16/2015  0802 05/19/2015 1544 Full Code [id number]  Saundra Shelling, MD Inpatient   08/09/2014 2357 08/13/2014 1817 Full Code [id number]  Nicholes Mango, MD Inpatient   06/15/2014 1402 06/22/2014 2157 Full Code [id number]  Hillary Bow, MD ED   Advance Care Planning Activity      TOTAL TIME TAKING CARE OF THIS PATIENT: *40* minutes.    Fritzi Mandes M.D on 08/08/2018 at 2:03 PM  Between 7am to 6pm - Pager - [phone] After 6pm go to [email or web address] - password [email or web address] [facility]  Office  [phone]  CC: Primary care physician; Albina Billet, MD

## 2018-08-08 NOTE — Progress Notes (Addendum)
[place] [facility] [place] NAME: Dakota Bowen    MR#:  [id number]  DATE OF BIRTH:  [birth year]  SUBJECTIVE:  In better Spirits!! continues with  abdominal pain left upper quadrant along with nausea  REVIEW OF SYSTEMS:   Review of Systems  Constitutional: Negative for chills, fever and weight loss.  HENT: Negative for ear discharge, ear pain and nosebleeds.   Eyes: Negative for blurred vision, pain and discharge.  Respiratory: Negative for sputum production, shortness of breath, wheezing and stridor.   Cardiovascular: Negative for chest pain, palpitations, orthopnea and PND.  Gastrointestinal: Negative for abdominal pain, diarrhea, nausea and vomiting.  Genitourinary: Negative for frequency and urgency.  Musculoskeletal: Negative for back pain and joint pain.  Neurological: Negative for sensory change, speech change, focal weakness and weakness.  Psychiatric/Behavioral: Negative for depression and hallucinations. The patient is not nervous/anxious.    Tolerating Diet: regular  DRUG ALLERGIES:     VITALS:  Blood pressure 105/76, pulse 68, temperature 98.3 F (36.8 C), temperature source Oral, resp. rate 20, height 6' (1.829 m), weight 68 kg, SpO2 98 %.  PHYSICAL EXAMINATION:   Physical Exam  GENERAL:  38 y.o.-year-old patient lying in the bed with no acute distress. Appears ill EYES: Pupils equal, round, reactive to light and accommodation. No scleral icterus. Extraocular muscles intact.  HEENT: Head atraumatic, normocephalic. Oropharynx and nasopharynx clear.  NECK:  Supple, no jugular venous distention. No thyroid enlargement, no tenderness.  LUNGS: Normal breath sounds bilaterally, no wheezing, rales, rhonchi. No use of accessory muscles of respiration.  CARDIOVASCULAR: S1, S2 normal. No murmurs, rubs, or gallops.  ABDOMEN: Soft, left upper quadrant tender, distended. Few Bowel sounds present. HS megaly+ EXTREMITIES: No  cyanosis, clubbing or edema b/l.    NEUROLOGIC: Cranial nerves II through XII are intact. No focal Motor or sensory deficits b/l.   PSYCHIATRIC:  patient is alert and oriented x 3.  SKIN: No obvious rash, lesion, or ulcer. No petechial rash  LABORATORY PANEL:  CBC Recent Labs  Lab 08/07/18 0838  WBC 5.9  HGB 13.8  HCT 41.1  PLT 21*    Chemistries  Recent Labs  Lab 08/07/18 0838 08/08/18 0310  NA 134*  --   K 3.7  --   CL 100  --   CO2 23  --   GLUCOSE 84  --   BUN 14  --   CREATININE 0.61 0.58*  CALCIUM 7.2*  --   AST 247*  --   ALT 96*  --   ALKPHOS 564*  --   BILITOT 2.8*  --    Cardiac Enzymes No results for input(s): TROPONINI in the last 168 hours. RADIOLOGY:  Ct Bone Marrow Biopsy & Aspiration  Result Date: 08/06/2018 CLINICAL DATA:  Lymphadenopathy and hepatosplenomegaly. Bone marrow biopsy required for further workup. EXAM: CT GUIDED BONE MARROW ASPIRATION AND BIOPSY ANESTHESIA/SEDATION: Versed 3.0 mg IV, Fentanyl 75 mcg IV Total Moderate Sedation Time:   12 minutes. The patient's level of consciousness and physiologic status were continuously monitored during the procedure by Radiology nursing. PROCEDURE: The procedure risks, benefits, and alternatives were explained to the patient. Questions regarding the procedure were encouraged and answered. The patient understands and consents to the procedure. A time out was performed prior to initiating the procedure. The right gluteal region was prepped with chlorhexidine. Sterile gown and sterile gloves were used for the procedure. Local anesthesia was provided with 1% Lidocaine. Under CT guidance, an 32  gauge On Control bone cutting needle was advanced from a posterior approach into the right iliac bone. Needle positioning was confirmed with CT. Initial non heparinized and heparinized aspirate samples were obtained of bone marrow. Core biopsy was performed via the On Control drill needle. COMPLICATIONS: None FINDINGS:  Inspection of initial aspirate did reveal visible particles. Intact core biopsy sample was obtained. IMPRESSION: CT guided bone marrow biopsy of right posterior iliac bone with both aspirate and core samples obtained. Electronically Signed   By: Aletta Edouard M.D.   On: 08/06/2018 12:01   ASSESSMENT AND PLAN:  Dakota Bowen  is a 38 y.o. male with a known history of cyclical vomiting syndrome and GERD.  He presented to the emergency room reporting a 3-day history of intractable nausea and vomiting having become unable to eat or drink over the last 2 days without vomiting.  He reports having had no urine output over the last 24 hours.  He is also experiencing left upper abdominal pain which he describes as sharp and aching with a pain score 8-9 out of 10.  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 Janese Banks with hematology/oncology consulted for further evaluation and recommendations - TEE Negative for endocarditis -CT chest on admission shows significant lymphadenopathy..  -repeat CT abdomen 7/21--showed ascites/anasarca/rupture/significant LAD/multiple splenic infarct (auto infarcts due to severe splenomegaly) - s/p ultrasound-guided core lymph node biopsy from right axilla today- to rule out lymphoma--results pending -s/p BM biopsy --results pending -Dr Windell Moment to see for possible splenectomy--on hold till bx results are back -Pt is s/p (July 22 nd) H influenza, meningococcal and pneumococcal vaccines in prep for possible splenectomy. -Patient should receive PPSV23 ~ 8 weeks from today and likely will need Meningococcal serogroup B vaccine at that time as well (Bexsero; Trumenba) -Dr Burnis Kingfisher with him regarding Vascular embolization of spleen prior to splenectomy--He will  do it on Monday  2. intractable nausea vomiting with  Dehydration--resolved - tolerating po diet some -dietitian to see - prn IV antiemetic  3.  Elevated liver function studies with severe hepatomegaly - Hepatitis panel B and C negative - Gastroenterology Dr Georgeann Oppenheim input noted  -Liver doppler no Portal vein thrombosis -ceruloplasmin neg -Antismooth muscle antibody negative - varicella zoster PCR, immunoglobulin quantitative, alpha-1 and trypsin phenotype pending   4.  Epigastric and upper abdominal pain due to splenic infacrts - Pain is being managed with IV analgesic -  GERD with po Protonix  5. Thrombocytopenia likely secondary to liver injury/? Lymphoma -per Dr. Janese Banks peripheral smear does not showschistocytes -LDH elevated 1117--913 -Plt count 39K--27K--23 K -transfuse plts as needed. No bleeding  D/w mom on the phone 2 days ago  Case discussed with Care Management/Social Worker. Management plans discussed with the patient , Dr Janese Banks , Dr dew  CODE STATUS: full  DVT Prophylaxis: SCD  TOTAL TIME TAKING CARE OF THIS PATIENT: *30* minutes.  >50% time spent on counselling and coordination of care  POSSIBLE D/C IN few DAYS, DEPENDING ON CLINICAL CONDITION.  Note: This dictation was prepared with Dragon dictation along with smaller phrase technology. Any transcriptional errors that result from this process are unintentional.  Fritzi Mandes M.D on 08/08/2018 at 9:43 AM  Between 7am to 6pm - Pager - [phone]  After 6pm go to [email or web address] - password [email or web address]  [employer]  Office  [phone]  CC: Primary care physician; Albina Billet, MDPatient ID: Dakota Bowen, male   DOB: 25-Jan-1980, 39 y.o.   MRN: [id number]

## 2018-08-08 NOTE — Progress Notes (Signed)
Dakota Bowen , MD [address], [place], [place], [place], [address] [address] [place], [place], [place], [address] Phone: [phone]  Fax: [phone]   Dakota Bowen is being followed for abdominal pain  Subjective: No change since yesterday still has the abdominal pain but able to eat   Objective: Vital signs in last 24 hours: Vitals:   08/07/18 2007 08/08/18 0356 08/08/18 1100 08/08/18 1211  BP: 108/75 105/76  111/75  Pulse: 85 68  70  Resp: 20 20  20   Temp: 98.8 F (37.1 C) 98.3 F (36.8 C)  98.1 F (36.7 C)  TempSrc: Oral Oral  Oral  SpO2: 98% 98% 98% 98%  Weight:      Height:       Weight change:   Intake/Output Summary (Last 24 hours) at 08/08/2018 1252 Last data filed at 08/08/2018 1007 Gross per 24 hour  Intake 480 ml  Output 875 ml  Net -395 ml     Exam: Heart:: Regular rate and rhythm, S1S2 present or without murmur or extra heart sounds Lungs: normal, clear to auscultation and clear to auscultation and percussion Abdomen: soft, nontender, large hepatosplenomegaly , normal bowel sounds   Lab Results: @LABTEST2 @ Micro Results: Recent Results (from the past 240 hour(s))  SARS Coronavirus 2 (CEPHEID- Performed in [place] hospital lab), Hosp Order     Status: None   Collection Time: 08/03/18  9:39 PM   Specimen: Nasopharyngeal Swab  Result Value Ref Range Status   SARS Coronavirus 2 NEGATIVE NEGATIVE Final    Comment: (NOTE) If result is NEGATIVE SARS-CoV-2 target nucleic acids are NOT DETECTED. The SARS-CoV-2 RNA is generally detectable in upper and lower  respiratory specimens during the acute phase of infection. The lowest  concentration of SARS-CoV-2 viral copies this assay can detect is 250  copies / mL. A negative result does not preclude SARS-CoV-2 infection  and should not be used as the sole basis for treatment or other  patient management decisions.  A negative result may occur with  improper specimen collection /  handling, submission of specimen other  than nasopharyngeal swab, presence of viral mutation(s) within the  areas targeted by this assay, and inadequate number of viral copies  (<250 copies / mL). A negative result must be combined with clinical  observations, patient history, and epidemiological information. If result is POSITIVE SARS-CoV-2 target nucleic acids are DETECTED. The SARS-CoV-2 RNA is generally detectable in upper and lower  respiratory specimens dur ing the acute phase of infection.  Positive  results are indicative of active infection with SARS-CoV-2.  Clinical  correlation with patient history and other diagnostic information is  necessary to determine patient infection status.  Positive results do  not rule out bacterial infection or co-infection with other viruses. If result is PRESUMPTIVE POSTIVE SARS-CoV-2 nucleic acids MAY BE PRESENT.   A presumptive positive result was obtained on the submitted specimen  and confirmed on repeat testing.  While 2019 novel coronavirus  (SARS-CoV-2) nucleic acids may be present in the submitted sample  additional confirmatory testing may be necessary for epidemiological  and / or clinical management purposes  to differentiate between  SARS-CoV-2 and other Sarbecovirus currently known to infect humans.  If clinically indicated additional testing with an alternate test  methodology [phone]) is advised. The SARS-CoV-2 RNA is generally  detectable in upper and lower respiratory sp ecimens during the acute  phase of infection. The expected result is Negative. Fact Sheet for Patients:  StrictlyIdeas.no Fact Sheet for Healthcare Providers: BankingDealers.co.za This test is not yet approved or cleared by the [place] FDA and has been authorized for detection and/or diagnosis of SARS-CoV-2 by FDA under an Emergency Use Authorization (EUA).  This EUA will remain in effect (meaning this  test can be used) for the duration of the COVID-19 declaration under Section 564(b)(1) of the Act, 21 U.S.C. section 360bbb-3(b)(1), unless the authorization is terminated or revoked sooner. Performed at [facility], [address] [place], [place] [address]   Culture, blood (routine x 2)     Status: None   Collection Time: 08/03/18 10:33 PM   Specimen: BLOOD  Result Value Ref Range Status   Specimen Description BLOOD RIGHT ANTECUBITAL  Final   Special Requests   Final    BOTTLES DRAWN AEROBIC AND ANAEROBIC Blood Culture adequate volume   Culture   Final    NO GROWTH 5 DAYS Performed at [facility], [address], [place], [place] [address]    Report Status 08/08/2018 FINAL  Final  Culture, blood (routine x 2)     Status: None   Collection Time: 08/03/18 10:33 PM   Specimen: BLOOD  Result Value Ref Range Status   Specimen Description BLOOD LEFT HAND  Final   Special Requests   Final    BOTTLES DRAWN AEROBIC AND ANAEROBIC Blood Culture results may not be optimal due to an inadequate volume of blood received in culture bottles   Culture   Final    NO GROWTH 5 DAYS Performed at [facility], [address], [place],  [address]    Report Status 08/08/2018 FINAL  Final   Studies/Results: No results found. Medications: I have reviewed the patient's current medications. Scheduled Meds: . enoxaparin (LOVENOX) injection  40 mg Subcutaneous Q24H  . feeding supplement (ENSURE ENLIVE)  237 mL Oral TID BM  . multivitamin with minerals  1 tablet Oral Daily  . nicotine  21 mg Transdermal Daily  . pantoprazole  40 mg Oral BID AC  . predniSONE  100 mg Oral Q breakfast  . vitamin C  500 mg Oral BID   Continuous Infusions: . [START ON 08/11/2018] sodium chloride     PRN Meds:.acetaminophen **OR** acetaminophen, ALPRAZolam, alum & mag hydroxide-simeth, HYDROcodone-acetaminophen, morphine injection, [DISCONTINUED] ondansetron **OR** ondansetron  (ZOFRAN) IV, polyethylene glycol, promethazine, simethicone   Assessment: Active Problems:   Abdominal pain   Dakota Bowen a 38 y.o.y/o malewith a history of prior polysubstance abuse, possible cannabinoid hyperemesis syndrome rather than a diagnosis of cyclical vomiting syndrome as he has been using marijuana for a long time. He has been admitted again on this occasion with abdominal pain, emesis and found to have an acute rise in the liver function tests with an INR of 1.7 suggesting acute liver failure. 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   Plan 1. Abnormal liver function tests new onset: HSV,HIV,HEp B/A,ANA,CMV,COVID- negative  , USG doppler shows no vascular occlusions   2. Hepatosplenomegalyno clear evidence of chronic liver disease or features of portal hypertension on imaging . The thrombocytopenia is acute not long standing.  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 Hematology is following determine if there is any hypercoagulable disorder,blood-borne malignancy such as leukemia and lymphomas.S/p bx of axillary lymph node and bone marrow bx  4. Emesis - improved - likely due to cannabis use   5. Acute liver failure:INR 1.9 - unclear etiology- F/u  autoimmune tests and genetic disorders although I feel more  related to a lymphoproliferative disorder which is causing the hepatosplenomegaly .   6. Abdominal pain likely secondary to hepatosplenomegaly- awaiting bone marrow and lymph node bx results  Surgery following , serial abdominal exams .   Being planned for splenic embolization on Monday  LOS: 4 days   Dakota Bellows, MD 08/08/2018, 12:52 PM

## 2018-08-08 NOTE — Progress Notes (Signed)
I received a call from DR. Grover and DR. Venora Maples. LN biopsy is not suggestive of acute leukemia and was discordant with bone marrow results. Currently the diagnosis of acute leukemia remains uncertain. Other differentials include melanoma. Nevertheless patient is going to [facility] and will get further work up there  Dr. Randa Evens, MD, MPH [facility] at [facility] [phone] 08/08/2018 5:45 PM

## 2018-08-08 NOTE — Consult Note (Signed)
[name]  MRN : [id number]  Dakota Bowen is a 38 y.o. ([birth year]) male who presents with chief complaint of  Chief Complaint  Patient presents with  . Emesis   History of Present Illness:  The patient is a 38 year old male with a past medical history of sickle cell anemia, renal insufficiency, multiple myeloma, hypertension, diabetes, collagen vascular disease, congestive heart failure, cyclic vomiting syndrome, active tobacco abuse polysubstance abuse, suicidal idealization, medication overdose who presented to the [facility] emergency department on August 03, 2018 with a chief complaint of "abdominal pain".  The patient endorses a history of 3 days of progressively worsening vomiting which prompted him to seek medical attention in our emergency department.  The patient does have a past medical history of cyclical vomiting and states that he is admitted approximately every 6 to 7 months due to this.  CT of the abdomen and pelvis conducted as part of his emergency room work-up was notable for hepatosplenomegaly with diffuse fatty infiltration of the liver as well as significant enlargement of the spleen which was new since the prior CT with multiple areas of splenic infarct as well.  No evidence of bowel obstruction or active inflammation.  There is also small amount of free fluid within the pelvis and mild diffuse subcutaneous edema present.  General surgery was consulted on August 06, 2018 -patient will most likely need an open splenectomy in the near future and due to the size of the spleen vascular surgery was consulted for embolization prior to surgery.   Vascular surgery was consulted by Dr. Posey Pronto Current Facility-Administered Medications  Medication Dose Route Frequency Provider Last Rate Last Dose  . acetaminophen (TYLENOL) tablet 650 mg  650 mg Oral Q6H PRN Seals, Theo Dills, NP       Or  . acetaminophen  (TYLENOL) suppository 650 mg  650 mg Rectal Q6H PRN Seals, Theo Dills, NP      . ALPRAZolam Duanne Moron) tablet 0.25 mg  0.25 mg Oral TID PRN Fritzi Mandes, MD   0.25 mg at 08/08/18 0702  . alum & mag hydroxide-simeth (MAALOX/MYLANTA) 200-200-20 MG/5ML suspension 30 mL  30 mL Oral Q6H PRN Seals, Angela H, NP      . enoxaparin (LOVENOX) injection 40 mg  40 mg Subcutaneous Q24H Cammie Sickle, MD   40 mg at 08/06/18 2039  . feeding supplement (ENSURE ENLIVE) (ENSURE ENLIVE) liquid 237 mL  237 mL Oral TID BM Fritzi Mandes, MD      . HYDROcodone-acetaminophen (NORCO/VICODIN) 5-325 MG per tablet 1-2 tablet  1-2 tablet Oral Q4H PRN Fritzi Mandes, MD   2 tablet at 08/08/18 0830  . morphine 2 MG/ML injection 2 mg  2 mg Intravenous Q3H PRN Fritzi Mandes, MD   2 mg at 08/08/18 1050  . multivitamin with minerals tablet 1 tablet  1 tablet Oral Daily Fritzi Mandes, MD   1 tablet at 08/08/18 0816  . nicotine (NICODERM CQ - dosed in mg/24 hours) patch 21 mg  21 mg Transdermal Daily Fritzi Mandes, MD   21 mg at 08/07/18 2112  . ondansetron (ZOFRAN) injection 4 mg  4 mg Intravenous Q4H PRN Fritzi Mandes, MD   4 mg at 08/07/18 0604  . pantoprazole (PROTONIX) EC tablet 40 mg  40 mg Oral BID AC Fritzi Mandes, MD   40 mg at 08/08/18 0816  . polyethylene glycol (MIRALAX / GLYCOLAX) packet 17 g  17 g Oral Daily PRN Seals, Levada Dy  H, NP      . predniSONE (DELTASONE) tablet 100 mg  100 mg Oral Q breakfast Sindy Guadeloupe, MD   100 mg at 08/08/18 0816  . promethazine (PHENERGAN) injection 12.5 mg  12.5 mg Intravenous Q6H PRN Fritzi Mandes, MD   12.5 mg at 08/08/18 1101  . simethicone (MYLICON) chewable tablet 80 mg  80 mg Oral TID PRN Mayer Camel, NP   80 mg at 08/07/18 1840  . vitamin C (ASCORBIC ACID) tablet 500 mg  500 mg Oral BID Fritzi Mandes, MD   500 mg at 08/08/18 1657   Past Medical History:  Diagnosis Date  . Cyclical vomiting syndrome   . GERD (gastroesophageal reflux disease)   . GERD (gastroesophageal reflux disease)     Past Surgical History:  Procedure Laterality Date  . CHOLECYSTECTOMY    . ESOPHAGOGASTRODUODENOSCOPY (EGD) WITH PROPOFOL N/A 06/21/2014   Procedure: ESOPHAGOGASTRODUODENOSCOPY (EGD) WITH PROPOFOL. Looking in the esophagus stomach and upper small intestine with a lighted tube to examine and treat.;  Surgeon: Lollie Sails, MD;  Location: [facility] ENDOSCOPY;  Service: Endoscopy;  Laterality: N/A;  . TEE WITHOUT CARDIOVERSION N/A 08/05/2018   Procedure: TRANSESOPHAGEAL ECHOCARDIOGRAM (TEE);  Surgeon: Corey Skains, MD;  Location: [facility] ORS;  Service: Cardiovascular;  Laterality: N/A;   Social History Social History   Tobacco Use  . Smoking status: Current Every Day Smoker    Packs/day: 1.50  . Smokeless tobacco: Never Used  Substance Use Topics  . Alcohol use: Yes    Comment: 3-4beers/day  . Drug use: Yes    Types: Marijuana, Cocaine, Heroin   Family History Family History  Problem Relation Age of Onset  . Diabetes Mother   . Cancer Mother   . Diabetes Father   Denies family history of peripheral artery disease, venous disease or renal disease.  Allergies: PCN  REVIEW OF SYSTEMS (Negative unless checked)  Constitutional: [] Weight loss  [] Fever  [] Chills Cardiac: [] Chest pain   [] Chest pressure   [] Palpitations   [] Shortness of breath when laying flat   [] Shortness of breath at rest   [] Shortness of breath with exertion. Vascular:  [] Pain in legs with walking   [] Pain in legs at rest   [] Pain in legs when laying flat   [] Claudication   [] Pain in feet when walking  [] Pain in feet at rest  [] Pain in feet when laying flat   [] History of DVT   [] Phlebitis   [] Swelling in legs   [] Varicose veins   [] Non-healing ulcers Pulmonary:   [] Uses home oxygen   [] Productive cough   [] Hemoptysis   [] Wheeze  [] COPD   [] Asthma Neurologic:  [] Dizziness  [] Blackouts   [] Seizures   [] History of stroke   [] History of TIA  [] Aphasia   [] Temporary blindness   [] Dysphagia   [] Weakness or numbness in  arms   [] Weakness or numbness in legs Musculoskeletal:  [] Arthritis   [] Joint swelling   [] Joint pain   [] Low back pain Hematologic:  [] Easy bruising  [] Easy bleeding   [] Hypercoagulable state   [] Anemic  [x] Hepatitis Gastrointestinal:  [] Blood in stool   [] Vomiting blood  [] Gastroesophageal reflux/heartburn   [] Difficulty swallowing. Genitourinary:  [] Chronic kidney disease   [] Difficult urination  [] Frequent urination  [] Burning with urination   [] Blood in urine Skin:  [] Rashes   [] Ulcers   [] Wounds Psychological:  [] History of anxiety   []  History of major depression.  Positive for abdominal pain, nausea any vomiting  Physical Examination  Vitals:  08/07/18 2007 08/08/18 0356 08/08/18 1100 08/08/18 1211  BP: 108/75 105/76  111/75  Pulse: 85 68  70  Resp: 20 20  20   Temp: 98.8 F (37.1 C) 98.3 F (36.8 C)  98.1 F (36.7 C)  TempSrc: Oral Oral  Oral  SpO2: 98% 98% 98% 98%  Weight:      Height:       Body mass index is 20.34 kg/m. Gen:  WD/WN, NAD Head: [place]/AT, No temporalis wasting. Prominent temp pulse not noted. Ear/Nose/Throat: Hearing grossly intact, nares w/o erythema or drainage, oropharynx w/o Erythema/Exudate Eyes: Sclera non-icteric, conjunctiva clear Neck: Trachea midline.  No JVD.  Pulmonary:  Good air movement, respirations not labored, equal bilaterally.  Cardiac: RRR, normal S1, S2. Vascular:  Vessel Right Left  Radial Palpable Palpable  Ulnar Palpable Palpable  Brachial Palpable Palpable  Carotid Palpable, without bruit Palpable, without bruit  Aorta Not palpable N/A  Femoral Palpable Palpable  Popliteal Palpable Palpable  PT Palpable Palpable  DP Palpable Palpable   Gastrointestinal: Soft. Palpable liver and spleen. Tender to palpation.    Musculoskeletal: M/S 5/5 throughout.  Extremities without ischemic changes.  No deformity or atrophy. No edema. Neurologic: Sensation grossly intact in extremities.  Symmetrical.  Speech is fluent. Motor exam as  listed above. Psychiatric: Judgment intact, Mood & affect appropriate for pt's clinical situation. Dermatologic: No rashes or ulcers noted.  No cellulitis or open wounds. Lymph : No Cervical, Axillary, or Inguinal lymphadenopathy.  CBC Lab Results  Component Value Date   WBC 8.1 08/08/2018   HGB 13.3 08/08/2018   HCT 38.5 (L) 08/08/2018   MCV 88.3 08/08/2018   PLT 17 (LL) 08/08/2018   BMET    Component Value Date/Time   NA 131 (L) 08/08/2018 0928   NA 139 09/23/2013 1702   K 4.7 08/08/2018 0928   K 3.6 09/23/2013 1702   CL 99 08/08/2018 0928   CL 102 09/23/2013 1702   CO2 23 08/08/2018 0928   CO2 25 09/23/2013 1702   GLUCOSE 155 (H) 08/08/2018 0928   GLUCOSE 165 (H) 09/23/2013 1702   BUN 15 08/08/2018 0928   BUN 8 09/23/2013 1702   CREATININE 0.61 08/08/2018 0928   CREATININE 0.98 09/23/2013 1702   CALCIUM 7.6 (L) 08/08/2018 0928   CALCIUM 9.3 09/23/2013 1702   GFRNONAA >60 08/08/2018 0928   GFRNONAA >60 09/23/2013 1702   GFRAA >60 08/08/2018 0928   GFRAA >60 09/23/2013 1702   Estimated Creatinine Clearance: 118.1 mL/min (by C-G formula based on SCr of 0.61 mg/dL).  COAG Lab Results  Component Value Date   INR 1.9 (H) 08/07/2018   INR 1.9 (H) 08/06/2018   INR 2.0 (H) 08/05/2018   Radiology Dg Chest 2 View  Result Date: 08/03/2018 CLINICAL DATA:  Two-day history of wheezing, nausea and vomiting. Current smoker. EXAM: CHEST - 2 VIEW COMPARISON:  01/24/2017 and earlier. FINDINGS: Cardiomediastinal silhouette unremarkable and unchanged. Mildly prominent bronchovascular markings and mild central peribronchial thickening, unchanged over multiple prior examinations. Lungs otherwise clear. No localized airspace consolidation. No pleural effusions. No pneumothorax. Normal pulmonary vascularity. Visualized bony thorax intact. IMPRESSION: Stable mild changes of chronic bronchitis and/or asthma. No acute cardiopulmonary disease. Electronically Signed   By: Evangeline Dakin M.D.    On: 08/03/2018 18:52   Ct Chest W Contrast  Result Date: 08/04/2018 CLINICAL DATA:  Nausea and upper abdominal pain. 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 EXAM: CT CHEST WITH CONTRAST TECHNIQUE: Multidetector CT imaging  of the chest was performed during intravenous contrast administration. CONTRAST:  50m OMNIPAQUE IOHEXOL 300 MG/ML  SOLN COMPARISON:  CT scan of the abdomen and pelvis August 03, 2018 FINDINGS: Cardiovascular: The heart size is normal. The thoracic aorta is nonaneurysmal with no dissection or atherosclerotic changes. Central pulmonary arteries are normal in caliber. No coronary artery calcifications are identified. Mediastinum/Nodes: Adenopathy in the axilla, right greater than left. A representative node in the right axilla on series 2, image 40 measures 2.1 cm. Retro pectoral adenopathy is seen on the right is well. No retropectoral adenopathy on the left. There is a mildly prominent epicardial node on series 2, image 120 measuring 8 mm, not definitely abnormal. A retrocrural node measures 11 mm on series 2, image 75, borderline. A few other shotty nodes are seen in the mediastinum without gross mediastinal adenopathy. No hilar adenopathy is identified. Lungs/Pleura: Central airways are normal. Paraseptal emphysematous changes in the apices, right greater than left. No pneumothorax. No suspicious nodules or masses. Dependent atelectasis is seen in the bases. There is a tiny left pleural effusion. Upper Abdomen: Splenomegaly is again identified. The patient's known splenic infarcts are not well assessed without contrast today. No other abnormalities in the upper abdomen. Musculoskeletal: No chest wall abnormality. No acute or significant osseous findings. IMPRESSION: 1. There is adenopathy in the bilateral axilla, right much greater than left. There is also retropectoral adenopathy on the right. Neoplastic and reactive/infectious/inflammatory causes are possible. One of the right  axillary nodes should be amenable to ultrasound-guided biopsy if clinically warranted. If biopsy is not pursued, recommend close follow-up. 2. Splenomegaly. The known splenic infarcts are not well assessed on this unenhanced study. 3. Mild paraseptal emphysematous changes in the apices. Emphysema (ICD10-J43.9). Electronically Signed   By: DDorise BullionIII M.D   On: 08/04/2018 13:40   Ct Abdomen Pelvis W Contrast  Result Date: 08/06/2018 CLINICAL DATA:  38year old male with hepatosplenomegaly and recent biopsy of enlarged axillary lymph nodes. Patient continues to have abdominal pain with distended abdomen. EXAM: CT ABDOMEN AND PELVIS WITH CONTRAST TECHNIQUE: Multidetector CT imaging of the abdomen and pelvis was performed using the standard protocol following bolus administration of intravenous contrast. CONTRAST:  1059mOMNIPAQUE IOHEXOL 300 MG/ML  SOLN COMPARISON:  CT of the abdomen pelvis dated 08/03/2018 FINDINGS: Lower chest: There are small bilateral pleural effusions, new since the prior CT. Minimal bibasilar dependent atelectatic changes noted. No intra-abdominal free air. Diffuse mesenteric edema and small ascites, increase in size since the prior CT. Hepatobiliary: The liver is enlarged measuring approximately 22 cm in midclavicular length. There is slight heterogeneous enhancement of the liver. No intrahepatic biliary ductal dilatation. Cholecystectomy. Pancreas: Unremarkable. No pancreatic ductal dilatation or surrounding inflammatory changes. Spleen: The spleen is enlarged with multiple areas of infarct as seen on the prior CT. The spleen measures approximately 22 cm in anterior-posterior dimension and 20 cm in craniocaudal length similar to prior CT. Adrenals/Urinary Tract: The adrenal glands are unremarkable. There is no hydronephrosis on either side head the symmetric enhancement and excretion of contrast by both kidneys. 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 The urinary  bladder is distended and grossly unremarkable. Stomach/Bowel: Thickened and edematous appearance of the ascending and proximal transverse colon may be related to ascites. Colitis is considered less likely. Clinical correlation is recommended. There is no bowel obstruction. The appendix is normal. Vascular/Lymphatic: The abdominal aorta and IVC are unremarkable. No portal venous gas. There is no adenopathy. Reproductive: The prostate and seminal vesicles  are grossly unremarkable. No pelvic mass. Other: Diffuse subcutaneous edema and anasarca new since the prior CT. Musculoskeletal: No acute or significant osseous findings. IMPRESSION: 1. Hepatosplenomegaly with multiple areas of splenic infarct as seen on the prior CT. 2. Small bilateral pleural effusions, small ascites, diffuse mesenteric edema and anasarca new since the prior CT. 3. Thickened and edematous appearance of the ascending and proximal transverse colon may be related to ascites. Colitis is considered less likely. Clinical correlation is recommended. No bowel obstruction. Normal appendix. Electronically Signed   By: Anner Crete M.D.   On: 08/06/2018 00:54   Ct Abdomen Pelvis W Contrast  Result Date: 08/03/2018 CLINICAL DATA:  38 year old male with abdominal pain and vomiting. EXAM: CT ABDOMEN AND PELVIS WITH CONTRAST TECHNIQUE: Multidetector CT imaging of the abdomen and pelvis was performed using the standard protocol following bolus administration of intravenous contrast. CONTRAST:  185m OMNIPAQUE IOHEXOL 300 MG/ML  SOLN COMPARISON:  CT of the abdomen pelvis dated 09/13/2016 FINDINGS: Lower chest: The visualized lung bases are clear. No intra-abdominal free air. There is a small free fluid within the pelvis. Hepatobiliary: The liver is enlarged measuring approximately 20 cm in midclavicular length. There is diffuse fatty infiltration of the liver. No intrahepatic biliary ductal dilatation. Cholecystectomy. No retained calcified stone  identified in the central CBD. Pancreas: Unremarkable. No pancreatic ductal dilatation or surrounding inflammatory changes. Spleen: The spleen is enlarged measuring approximately 20 cm in craniocaudal length and 22 cm in AP length. 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 Adrenals/Urinary Tract: The adrenal glands are unremarkable. There is no hydronephrosis on either side. The visualized ureters and urinary bladder appear unremarkable. Stomach/Bowel: There is no bowel obstruction or active inflammation. The appendix is normal. Vascular/Lymphatic: The abdominal aorta and IVC are unremarkable. No portal venous gas. There is no adenopathy. Reproductive: The prostate and seminal vesicles are grossly unremarkable. No pelvic mass. Other: There is paucity of subcutaneous fat. There is diffuse subcutaneous edema. Musculoskeletal: No acute or significant osseous findings. IMPRESSION: 1. Hepatosplenomegaly with diffuse fatty infiltration of the liver. Significant enlargement of the spleen, new since the prior CT with multiple areas of splenic infarct. 2. No bowel obstruction or active inflammation. Normal appendix. 3. Small free fluid within the pelvis and mild diffuse subcutaneous edema. Electronically Signed   By: AAnner CreteM.D.   On: 08/03/2018 20:12   Ct Bone Marrow Biopsy & Aspiration  Result Date: 08/06/2018 CLINICAL DATA:  Lymphadenopathy and hepatosplenomegaly. Bone marrow biopsy required for further workup. EXAM: CT GUIDED BONE MARROW ASPIRATION AND BIOPSY ANESTHESIA/SEDATION: Versed 3.0 mg IV, Fentanyl 75 mcg IV Total Moderate Sedation Time:   12 minutes. The patient's level of consciousness and physiologic status were continuously monitored during the procedure by Radiology nursing. PROCEDURE: The procedure risks, benefits, and alternatives were explained to the patient. Questions regarding the procedure were encouraged and answered.  The patient understands and consents to the procedure. A time out was performed prior to initiating the procedure. The right gluteal region was prepped with chlorhexidine. Sterile gown and sterile gloves were used for the procedure. Local anesthesia was provided with 1% Lidocaine. Under CT guidance, an 11 gauge On Control bone cutting needle was advanced from a posterior approach into the right iliac bone. Needle positioning was confirmed with CT. Initial non heparinized and heparinized aspirate samples were obtained of bone marrow. Core biopsy was performed via the On Control drill needle. COMPLICATIONS: None FINDINGS: Inspection of initial  aspirate did reveal visible particles. Intact core biopsy sample was obtained. IMPRESSION: CT guided bone marrow biopsy of right posterior iliac bone with both aspirate and core samples obtained. Electronically Signed   By: Aletta Edouard M.D.   On: 08/06/2018 12:01   US Liver Doppler  Result Date: 08/05/2018 CLINICAL DATA:  Abnormal liver function tests. EXAM: DUPLEX ULTRASOUND OF LIVER TECHNIQUE: Color and duplex Doppler ultrasound was performed to evaluate the hepatic in-flow and out-flow vessels. COMPARISON:  CT scan of August 03, 2018. FINDINGS: Liver: Increased echogenicity of hepatic parenchyma is noted. Normal hepatic contour without nodularity. No focal lesion, mass or intrahepatic biliary ductal dilatation. Main Portal Vein size: 1.73 cm Portal Vein Velocities Main Prox:  41.1 cm/sec Main Mid: 28.3 cm/sec Main Dist:  35.6 cm/sec Right: 21.4 cm/sec Left: 24.9 cm/sec Normal hepatopetal flow is noted in the portal veins. Hepatic Vein Velocities Right:  41.1 cm/sec Middle:  40.6 cm/sec Left:  34.9 cm/sec Normal hepatofugal flow is noted in the hepatic vein. IVC: Present and patent with normal respiratory phasicity. Hepatic Artery Velocity:  103.2 cm/sec Splenic Vein Velocity:  22 cm/sec Spleen: 16.25 cm x 20.7 cm x 10.63 cm with a total volume of 1872 cm^3 (411 cm^3 is  upper limit normal) Portal Vein Occlusion/Thrombus: No Splenic Vein Occlusion/Thrombus: No Ascites: None Varices: None IMPRESSION: No evidence of portal, hepatic or splenic venous thrombosis or occlusion. Increased echogenicity of hepatic parenchyma is noted suggesting hepatic steatosis. Severe splenomegaly is noted. Electronically Signed   By: Marijo Conception M.D.   On: 08/05/2018 12:13   [place] Core Biopsy (lymph Nodes)  Result Date: 08/05/2018 INDICATION: Lymphadenopathy, particularly in the right axillary region. Hepatosplenomegaly and splenic infarcts. EXAM: ULTRASOUND GUIDED CORE BIOPSY OF RIGHT AXILLARY LYMPH NODE MEDICATIONS: None. ANESTHESIA/SEDATION: None PROCEDURE: The procedure, risks, benefits, and alternatives were explained to the patient. Questions regarding the procedure were encouraged and answered. The patient understands and consents to the procedure. A time-out was performed prior to initiating the procedure. The right axilla was prepped with chlorhexidine in a sterile fashion, and a sterile drape was applied covering the operative field. A sterile gown and sterile gloves were used for the procedure. Local anesthesia was provided with 1% Lidocaine. Ultrasound was performed to localize right axillary lymph nodes. After choosing a lymph node for biopsy, a 16 gauge core biopsy device was utilized in obtaining 4 separate core biopsy samples of an enlarged lymph node. Core biopsy samples were submitted on Telfa gauze soaked with sterile saline. COMPLICATIONS: None immediate. FINDINGS: There are multiple enlarged right axillary lymph nodes. The largest measures approximately 3.5 x 1.9 x 1.8 cm. Solid tissue was obtained from this lymph node. IMPRESSION: Ultrasound-guided core biopsy performed of an enlarged right axillary lymph node measuring 3.5 cm in greatest dimension. Electronically Signed   By: Aletta Edouard M.D.   On: 08/05/2018 11:40   Assessment/Plan The patient is a 38 year old male with  a past medical history of sickle cell anemia, renal insufficiency, multiple myeloma, hypertension, diabetes, collagen vascular disease, congestive heart failure, cyclic vomiting syndrome, active tobacco abuse polysubstance abuse, suicidal idealization, medication overdose who presented to the [facility] emergency department on August 03, 2018 with a chief complaint of "abdominal pain". 1.  Splenomegaly: Patient will most likely need to have an open splenectomy in the near future.  Given the sheer size and complexity of the patient's spleen vascular surgery has been asked to embolize and attempt to reduce the risk of bleeding and  complications.  We will plan on splenic embolization on Monday with Dr. Lucky Cowboy.  Procedure, risks and benefits explained to the patient.  All questions answered.  The patient wishes to proceed. 2.  Tobacco abuse: We had a discussion for approximately three minutes regarding the absolute need for smoking cessation due to the deleterious nature of tobacco on the vascular system. We discussed the tobacco use would diminish patency of any intervention, and likely significantly worsen progressio of disease. We discussed multiple agents for quitting including replacement therapy or medications to reduce cravings such as Chantix. The patient voices their understanding of the importance of smoking cessation. 3.  Diabetes: On appropriate medications. Encouraged good control as its slows the progression of atherosclerotic disease.  Discussed with Dr. Mayme Genta, PA-C  08/08/2018 12:18 PM  This note was created with Dragon medical transcription system.  Any error is purely unintentional

## 2018-08-08 NOTE — Progress Notes (Signed)
Patient was accepted to [facility].  I called report to the RN at [facility] [place].  He accepted the report and all questions were answered.  [facility] ground transport arrived and report was given to them and they transferred the patient via stretcher.  I called the patient's mother and updated her on the transfer.

## 2018-08-09 LAB — FUNGAL ANTIBODIES PANEL, ID-BLOOD
Aspergillus flavus: NEGATIVE
Aspergillus fumigatus, IgG: NEGATIVE
Aspergillus niger: NEGATIVE
Blastomyces Abs, Qn, DID: NEGATIVE
Histoplasma Ab, Immunodiffusion: NEGATIVE

## 2018-08-10 LAB — VARICELLA-ZOSTER BY PCR: Varicella-Zoster, PCR: NEGATIVE

## 2018-08-10 MED ORDER — GENERIC EXTERNAL MEDICATION
Status: DC
Start: ? — End: 2018-08-10

## 2018-08-10 MED ORDER — ALLOPURINOL 300 MG PO TABS
300.00 | ORAL_TABLET | ORAL | Status: DC
Start: 2018-08-23 — End: 2018-08-10

## 2018-08-10 MED ORDER — NICOTINE 21 MG/24HR TD PT24
1.00 | MEDICATED_PATCH | TRANSDERMAL | Status: DC
Start: 2018-08-23 — End: 2018-08-10

## 2018-08-10 MED ORDER — SIMETHICONE 80 MG PO CHEW
80.00 | CHEWABLE_TABLET | ORAL | Status: DC
Start: ? — End: 2018-08-10

## 2018-08-10 MED ORDER — HYDROMORPHONE HCL 1 MG/ML IJ SOLN
2.00 | INTRAMUSCULAR | Status: DC
Start: ? — End: 2018-08-10

## 2018-08-10 MED ORDER — ALUM & MAG HYDROXIDE-SIMETH 400-400-40 MG/5ML PO SUSP
30.00 | ORAL | Status: DC
Start: ? — End: 2018-08-10

## 2018-08-10 MED ORDER — PANTOPRAZOLE SODIUM 40 MG PO TBEC
40.00 | DELAYED_RELEASE_TABLET | ORAL | Status: DC
Start: 2018-08-22 — End: 2018-08-10

## 2018-08-10 MED ORDER — ALPRAZOLAM 0.5 MG PO TABS
.50 | ORAL_TABLET | ORAL | Status: DC
Start: ? — End: 2018-08-10

## 2018-08-10 MED ORDER — POLYETHYLENE GLYCOL 3350 17 G PO PACK
17.00 | PACK | ORAL | Status: DC
Start: 2018-08-21 — End: 2018-08-10

## 2018-08-10 MED ORDER — ONDANSETRON HCL 4 MG/2ML IJ SOLN
4.00 | INTRAMUSCULAR | Status: DC
Start: ? — End: 2018-08-10

## 2018-08-10 MED ORDER — PROMETHAZINE HCL 25 MG/ML IJ SOLN
12.50 | INTRAMUSCULAR | Status: DC
Start: ? — End: 2018-08-10

## 2018-08-10 MED ORDER — GENERIC EXTERNAL MEDICATION
500.00 | Status: DC
Start: 2018-08-18 — End: 2018-08-10

## 2018-08-11 ENCOUNTER — Ambulatory Visit: Admit: 2018-08-11 | Payer: Self-pay | Admitting: Vascular Surgery

## 2018-08-11 DIAGNOSIS — R161 Splenomegaly, not elsewhere classified: Secondary | ICD-10-CM | POA: Insufficient documentation

## 2018-08-11 SURGERY — EMBOLIZATION
Anesthesia: Moderate Sedation

## 2018-08-11 MED ORDER — ALPRAZOLAM 0.5 MG PO TABS
.50 | ORAL_TABLET | ORAL | Status: DC
Start: ? — End: 2018-08-11

## 2018-08-11 MED ORDER — GENERIC EXTERNAL MEDICATION
Status: DC
Start: ? — End: 2018-08-11

## 2018-08-11 MED ORDER — BUSPIRONE HCL 10 MG PO TABS
5.00 | ORAL_TABLET | ORAL | Status: DC
Start: 2018-08-16 — End: 2018-08-11

## 2018-08-12 ENCOUNTER — Encounter (HOSPITAL_COMMUNITY): Payer: Self-pay | Admitting: Oncology

## 2018-08-12 LAB — HUMAN PARVOVIRUS DNA DETECTION BY PCR: Parvovirus B19, PCR: NEGATIVE

## 2018-08-12 MED ORDER — GENERIC EXTERNAL MEDICATION
Status: DC
Start: ? — End: 2018-08-12

## 2018-08-12 MED ORDER — GENERIC EXTERNAL MEDICATION
2.00 | Status: DC
Start: 2018-08-22 — End: 2018-08-12

## 2018-08-12 MED ORDER — MELATONIN 3 MG PO TABS
6.00 | ORAL_TABLET | ORAL | Status: DC
Start: 2018-08-22 — End: 2018-08-12

## 2018-08-12 MED ORDER — GENERIC EXTERNAL MEDICATION
12.50 | Status: DC
Start: ? — End: 2018-08-12

## 2018-08-12 MED ORDER — TRAZODONE HCL 50 MG PO TABS
50.00 | ORAL_TABLET | ORAL | Status: DC
Start: ? — End: 2018-08-12

## 2018-08-13 LAB — BCR-ABL1 FISH
Cells Analyzed: 200
Cells Counted: 200

## 2018-08-13 MED ORDER — GENERIC EXTERNAL MEDICATION
Status: DC
Start: ? — End: 2018-08-13

## 2018-08-14 MED ORDER — GENERIC EXTERNAL MEDICATION
Status: DC
Start: ? — End: 2018-08-14

## 2018-08-14 MED ORDER — MELATONIN 3 MG PO TABS
3.00 | ORAL_TABLET | ORAL | Status: DC
Start: ? — End: 2018-08-14

## 2018-08-14 MED ORDER — DEXTROSE 5 % IV SOLN
50.00 | INTRAVENOUS | Status: DC
Start: ? — End: 2018-08-14

## 2018-08-14 MED ORDER — FUROSEMIDE 10 MG/ML IJ SOLN
20.00 | INTRAMUSCULAR | Status: DC
Start: 2018-08-14 — End: 2018-08-14

## 2018-08-14 MED ORDER — HYDROMORPHONE HCL 1 MG/ML IJ SOLN
2.00 | INTRAMUSCULAR | Status: DC
Start: ? — End: 2018-08-14

## 2018-08-15 MED ORDER — GENERIC EXTERNAL MEDICATION
Status: DC
Start: ? — End: 2018-08-15

## 2018-08-15 MED ORDER — PREDNISONE 50 MG PO TABS
100.00 | ORAL_TABLET | ORAL | Status: DC
Start: 2018-08-19 — End: 2018-08-15

## 2018-08-16 MED ORDER — GENERIC EXTERNAL MEDICATION
Status: DC
Start: ? — End: 2018-08-16

## 2018-08-18 MED ORDER — BUSPIRONE HCL 10 MG PO TABS
10.00 | ORAL_TABLET | ORAL | Status: DC
Start: 2018-08-22 — End: 2018-08-18

## 2018-08-18 MED ORDER — GENERIC EXTERNAL MEDICATION
Status: DC
Start: ? — End: 2018-08-18

## 2018-08-18 MED ORDER — MORPHINE SULFATE ER 30 MG PO TBCR
30.00 | EXTENDED_RELEASE_TABLET | ORAL | Status: DC
Start: 2018-08-22 — End: 2018-08-18

## 2018-08-18 MED ORDER — OXYCODONE HCL ER 10 MG PO T12A
30.00 | EXTENDED_RELEASE_TABLET | ORAL | Status: DC
Start: 2018-08-18 — End: 2018-08-18

## 2018-08-18 MED ORDER — HYDROXYZINE HCL 25 MG PO TABS
25.00 | ORAL_TABLET | ORAL | Status: DC
Start: ? — End: 2018-08-18

## 2018-08-20 ENCOUNTER — Encounter: Payer: Self-pay | Admitting: Oncology

## 2018-08-20 LAB — SURGICAL PATHOLOGY

## 2018-08-20 MED ORDER — METHYLPREDNISOLONE SODIUM SUCC 125 MG IJ SOLR
125.00 | INTRAMUSCULAR | Status: DC
Start: ? — End: 2018-08-20

## 2018-08-20 MED ORDER — DIPHENHYDRAMINE HCL 50 MG/ML IJ SOLN
25.00 | INTRAMUSCULAR | Status: DC
Start: ? — End: 2018-08-20

## 2018-08-20 MED ORDER — GENERIC EXTERNAL MEDICATION
100.00 | Status: DC
Start: 2018-08-22 — End: 2018-08-20

## 2018-08-20 MED ORDER — SODIUM CHLORIDE 0.9 % IV SOLN
100.00 | INTRAVENOUS | Status: DC
Start: 2018-08-20 — End: 2018-08-20

## 2018-08-20 MED ORDER — PROCHLORPERAZINE MALEATE 10 MG PO TABS
10.00 | ORAL_TABLET | ORAL | Status: DC
Start: ? — End: 2018-08-20

## 2018-08-20 MED ORDER — ONDANSETRON HCL 8 MG PO TABS
24.00 | ORAL_TABLET | ORAL | Status: DC
Start: 2018-08-20 — End: 2018-08-20

## 2018-08-20 MED ORDER — GENERIC EXTERNAL MEDICATION
Status: DC
Start: ? — End: 2018-08-20

## 2018-08-20 MED ORDER — GENERIC EXTERNAL MEDICATION
100.00 | Status: DC
Start: 2018-08-20 — End: 2018-08-20

## 2018-08-20 MED ORDER — GENERIC EXTERNAL MEDICATION
750.00 | Status: DC
Start: 2018-08-20 — End: 2018-08-20

## 2018-08-20 MED ORDER — OLANZAPINE 10 MG PO TABS
10.00 | ORAL_TABLET | ORAL | Status: DC
Start: 2018-08-22 — End: 2018-08-20

## 2018-08-20 MED ORDER — GENERIC EXTERNAL MEDICATION
5.00 | Status: DC
Start: 2018-08-20 — End: 2018-08-20

## 2018-08-20 MED ORDER — METHYLPREDNISOLONE SODIUM SUCC 40 MG IJ SOLR
40.00 | INTRAMUSCULAR | Status: DC
Start: 2018-08-20 — End: 2018-08-20

## 2018-08-20 MED ORDER — FAMOTIDINE 20 MG/2ML IV SOLN
20.00 | INTRAVENOUS | Status: DC
Start: ? — End: 2018-08-20

## 2018-08-20 MED ORDER — NALOXONE HCL 0.4 MG/ML IJ SOLN
.40 | INTRAMUSCULAR | Status: DC
Start: ? — End: 2018-08-20

## 2018-08-20 MED ORDER — PROCHLORPERAZINE MALEATE 5 MG PO TABS
5.00 | ORAL_TABLET | ORAL | Status: DC
Start: ? — End: 2018-08-20

## 2018-08-20 MED ORDER — GENERIC EXTERNAL MEDICATION
10.00 | Status: DC
Start: ? — End: 2018-08-20

## 2018-08-20 MED ORDER — SODIUM CHLORIDE 0.9 % IV SOLN
1000.00 | INTRAVENOUS | Status: DC
Start: ? — End: 2018-08-20

## 2018-08-20 MED ORDER — EPINEPHRINE 0.3 MG/0.3ML IJ SOAJ
.30 | INTRAMUSCULAR | Status: DC
Start: ? — End: 2018-08-20

## 2018-08-20 MED ORDER — MEPERIDINE HCL 25 MG/ML IJ SOLN
25.00 | INTRAMUSCULAR | Status: DC
Start: ? — End: 2018-08-20

## 2018-08-20 MED ORDER — SODIUM CHLORIDE 0.9 % IV SOLN
20.00 | INTRAVENOUS | Status: DC
Start: ? — End: 2018-08-20

## 2018-08-22 MED ORDER — POLYETHYLENE GLYCOL 3350 17 G PO PACK
17.00 | PACK | ORAL | Status: DC
Start: 2018-08-22 — End: 2018-08-22

## 2018-08-22 MED ORDER — GENERIC EXTERNAL MEDICATION
Status: DC
Start: ? — End: 2018-08-22

## 2018-11-16 DEATH — deceased

## 2019-03-04 LAB — HSV DNA BY PCR (REFERENCE LAB)
HSV 1 DNA: NEGATIVE
HSV 2 DNA: NEGATIVE

## 2019-03-08 ENCOUNTER — Encounter: Payer: Self-pay | Admitting: Gastroenterology

## 2020-02-08 IMAGING — CT CT ABDOMEN AND PELVIS WITH CONTRAST
2 of 5 series · 15 of 46 positions shown, 17 images · IV contrast (APPLIED)
Comparison: CT of the abdomen pelvis dated 08/03/2018

CLINICAL DATA: 38-year-old male with hepatosplenomegaly and recent
biopsy of enlarged axillary lymph nodes. Patient continues to have
abdominal pain with distended abdomen.

EXAM:
CT ABDOMEN AND PELVIS WITH CONTRAST
TECHNIQUE: Multidetector CT imaging of the abdomen and pelvis was performed
using the standard protocol following bolus administration of
intravenous contrast.
CONTRAST:  100mL OMNIPAQUE IOHEXOL 300 MG/ML  SOLN

[Series 2: routine abd/pel with · axial · 0.74mm/px · z∈[-1058,-593]mm · 12 of 105 slices shown, 14 images]
[im 6/105  soft-tissue]
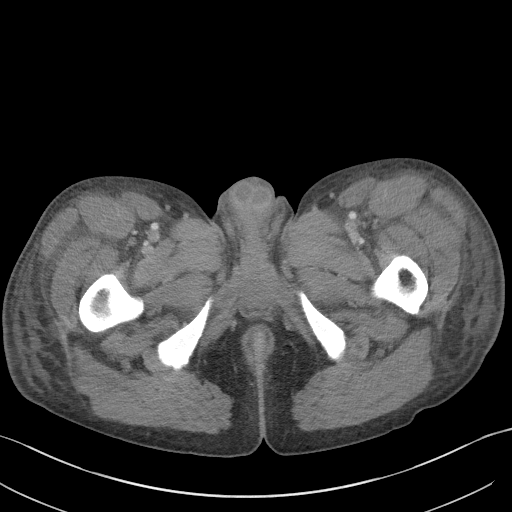
[im 6/105  bone]
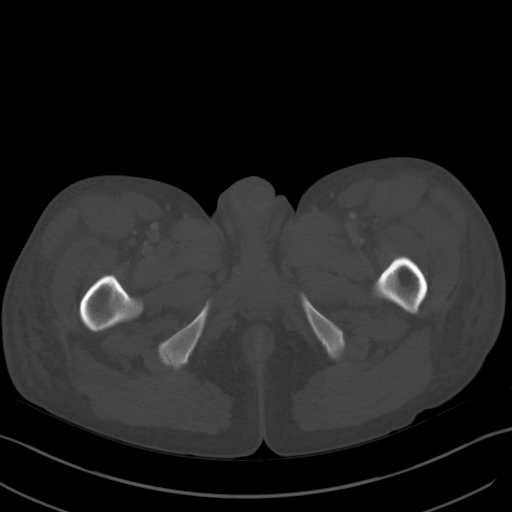
[im 16/105  soft-tissue]
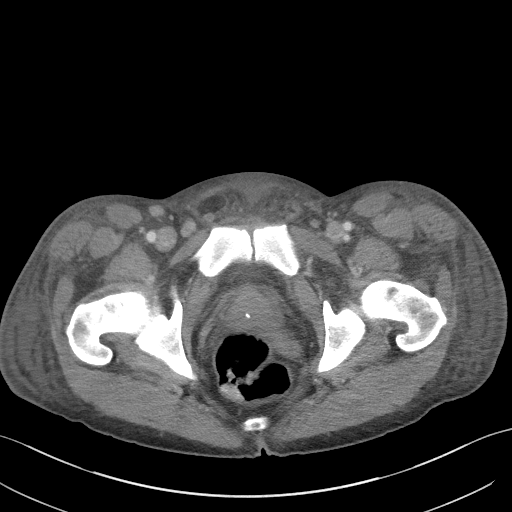
[im 21/105  soft-tissue]
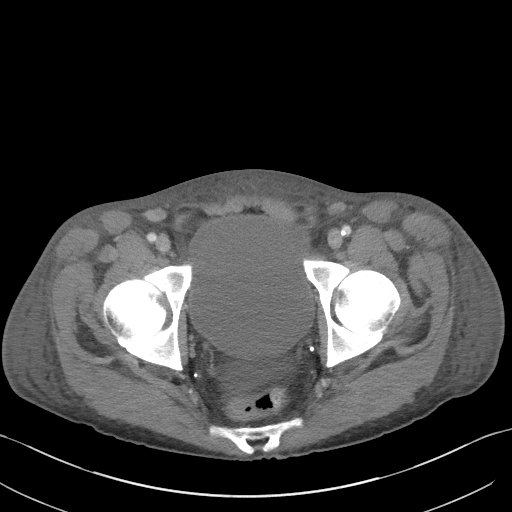
[im 32/105  soft-tissue]
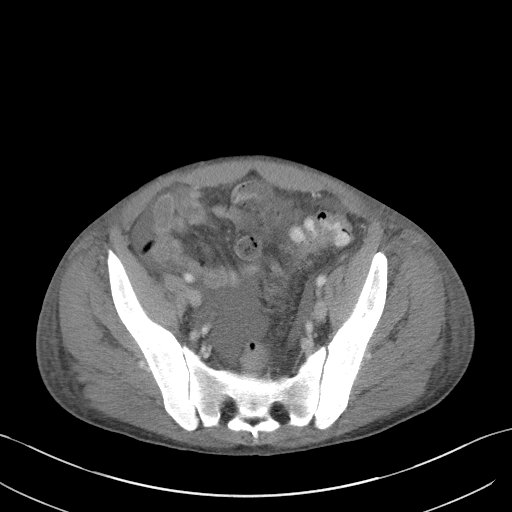
[im 42/105  soft-tissue]
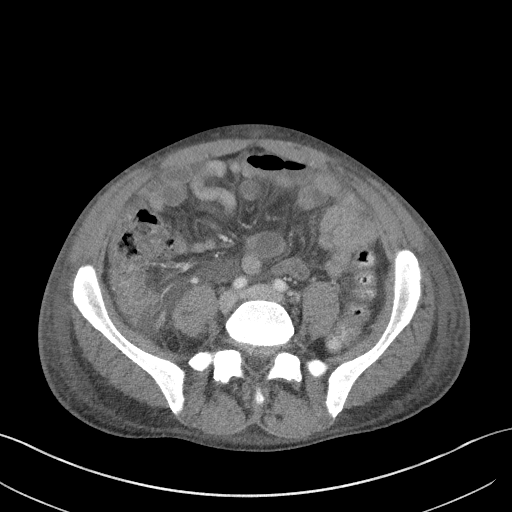
[im 47/105  soft-tissue]
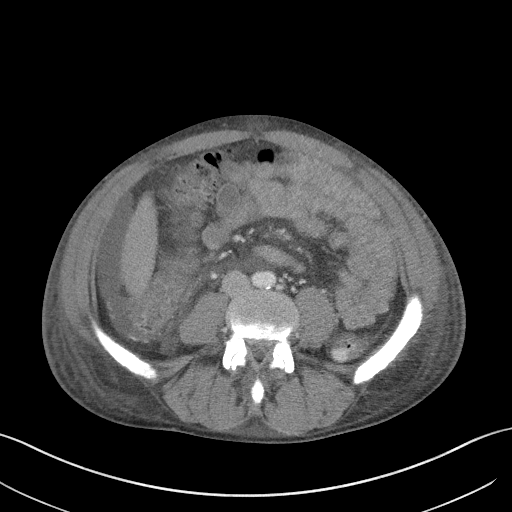
[im 58/105  soft-tissue]
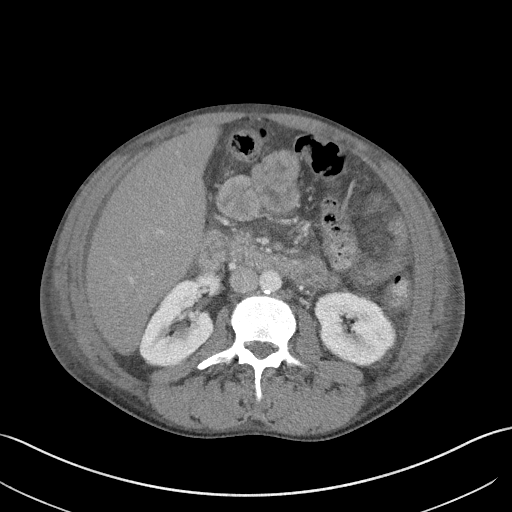
[im 63/105  soft-tissue]
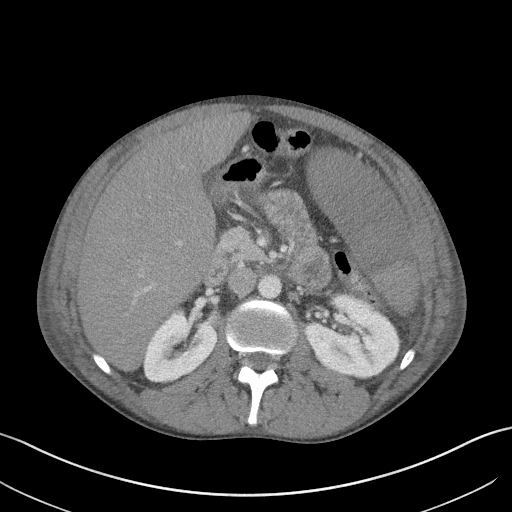
[im 73/105  soft-tissue]
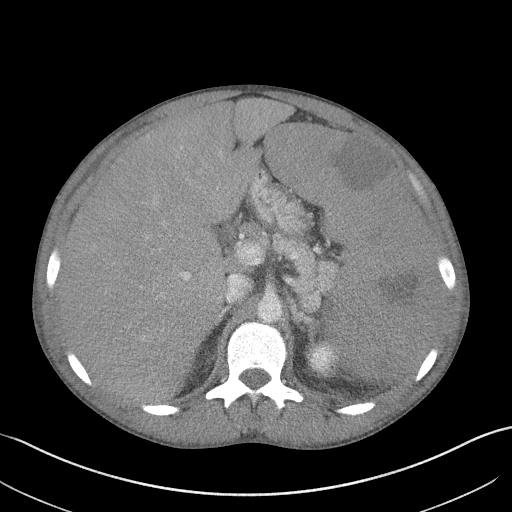
[im 73/105  bone]
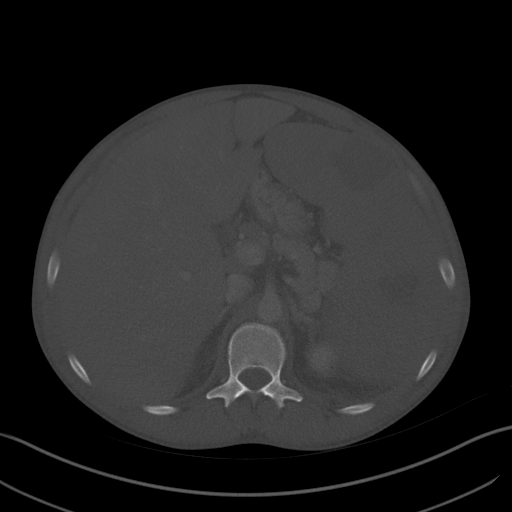
[im 84/105  soft-tissue]
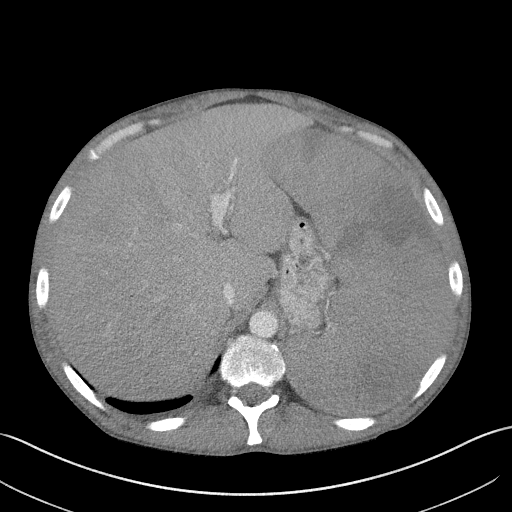
[im 89/105  soft-tissue]
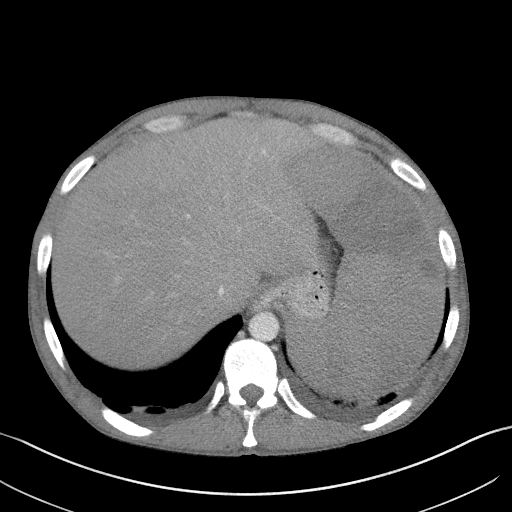
[im 99/105  soft-tissue]
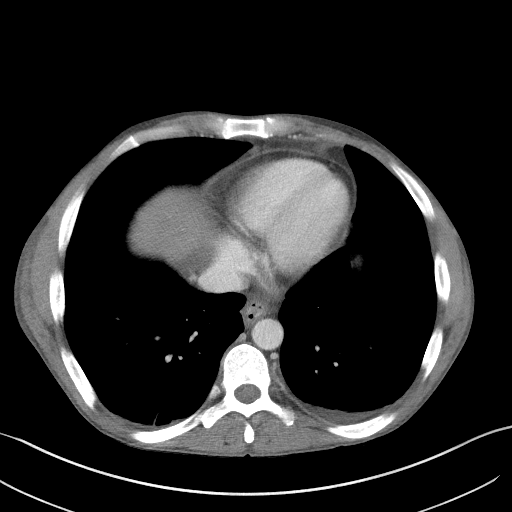

[Series 5: coronal st · coronal · 0.75mm/px · 3 of 90 slices shown]
[im 30/90  soft-tissue]
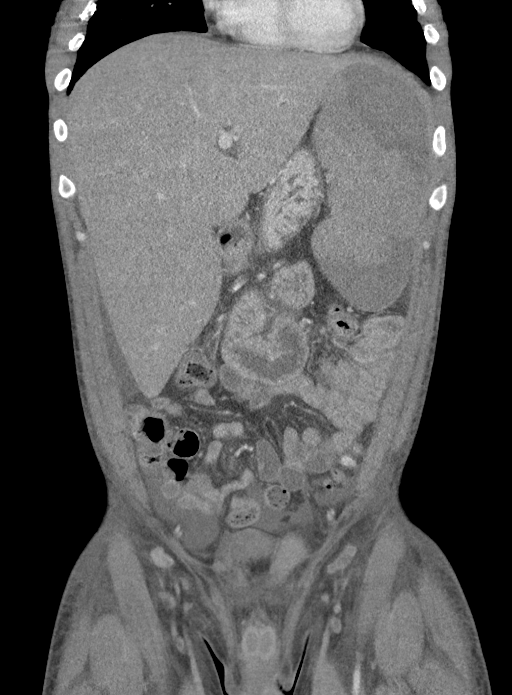
[im 40/90  soft-tissue]
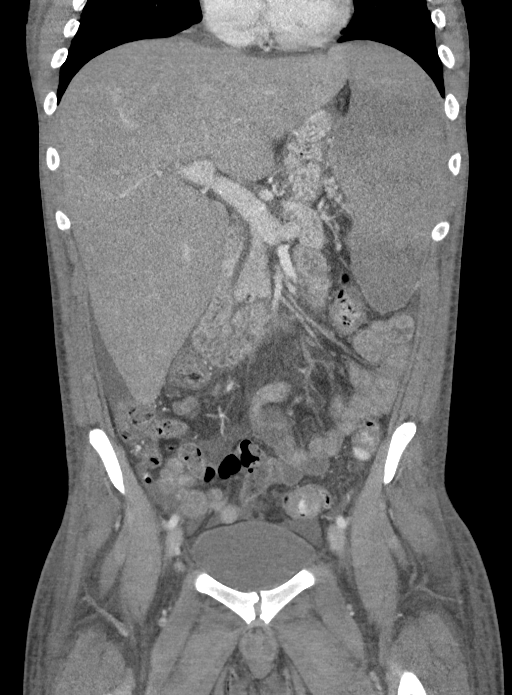
[im 50/90  soft-tissue]
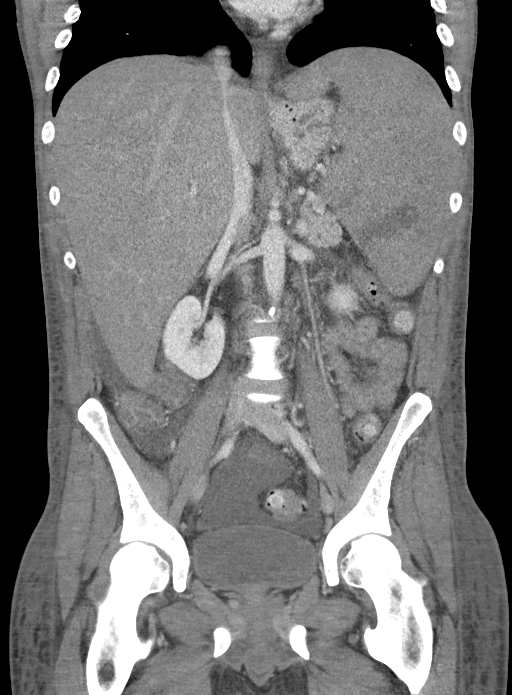

[15 of 46 positions shown; findings below may reference images not displayed]

FINDINGS: Lower chest: There are small bilateral pleural effusions, new since
the prior CT. Minimal bibasilar dependent atelectatic changes noted.

No intra-abdominal free air. Diffuse mesenteric edema and small
ascites, increase in size since the prior CT.

Hepatobiliary: The liver is enlarged measuring approximately 22 cm
in midclavicular length. There is slight heterogeneous enhancement
of the liver. No intrahepatic biliary ductal dilatation.
Cholecystectomy.

Pancreas: Unremarkable. No pancreatic ductal dilatation or
surrounding inflammatory changes.

Spleen: The spleen is enlarged with multiple areas of infarct as
seen on the prior CT. The spleen measures approximately 22 cm in
anterior-posterior dimension and 20 cm in craniocaudal length
similar to prior CT.

Adrenals/Urinary Tract: The adrenal glands are unremarkable. There
is no hydronephrosis on either side head the symmetric enhancement
and excretion of contrast by both kidneys. Duplicated right renal
collecting system and ureters. The urinary bladder is distended and
grossly unremarkable.

Stomach/Bowel: Thickened and edematous appearance of the ascending
and proximal transverse colon may be related to ascites. Colitis is
considered less likely. Clinical correlation is recommended. There
is no bowel obstruction. The appendix is normal.

Vascular/Lymphatic: The abdominal aorta and IVC are unremarkable. No
portal venous gas. There is no adenopathy.

Reproductive: The prostate and seminal vesicles are grossly
unremarkable. No pelvic mass.

Other: Diffuse subcutaneous edema and anasarca new since the prior
CT.

Musculoskeletal: No acute or significant osseous findings.
IMPRESSION: 1. Hepatosplenomegaly with multiple areas of splenic infarct as seen
on the prior CT.
2. Small bilateral pleural effusions, small ascites, diffuse
mesenteric edema and anasarca new since the prior CT.
3. Thickened and edematous appearance of the ascending and proximal
transverse colon may be related to ascites. Colitis is considered
less likely. Clinical correlation is recommended. No bowel
obstruction. Normal appendix.
